# Patient Record
Sex: Female | Born: 1960 | Race: White | Hispanic: No | Smoking: Current some day smoker
Health system: Southern US, Community
[De-identification: ages and names within clinical notes are randomized; demographics above are authoritative.]

## PROBLEM LIST (undated history)

## (undated) DIAGNOSIS — C19 Malignant neoplasm of rectosigmoid junction: Secondary | ICD-10-CM

## (undated) DIAGNOSIS — K624 Stenosis of anus and rectum: Secondary | ICD-10-CM

## (undated) DIAGNOSIS — T8859XA Other complications of anesthesia, initial encounter: Secondary | ICD-10-CM

## (undated) DIAGNOSIS — T4145XA Adverse effect of unspecified anesthetic, initial encounter: Secondary | ICD-10-CM

## (undated) DIAGNOSIS — Z932 Ileostomy status: Secondary | ICD-10-CM

## (undated) DIAGNOSIS — Z973 Presence of spectacles and contact lenses: Secondary | ICD-10-CM

## (undated) DIAGNOSIS — Z87442 Personal history of urinary calculi: Secondary | ICD-10-CM

## (undated) DIAGNOSIS — M1990 Unspecified osteoarthritis, unspecified site: Secondary | ICD-10-CM

## (undated) HISTORY — PX: COLOSTOMY: SHX63

## (undated) HISTORY — DX: Unspecified osteoarthritis, unspecified site: M19.90

---

## 1982-08-19 HISTORY — PX: BUNIONECTOMY: SHX129

## 1992-08-19 HISTORY — PX: TUBAL LIGATION: SHX77

## 1999-06-11 ENCOUNTER — Other Ambulatory Visit: Admission: RE | Admit: 1999-06-11 | Discharge: 1999-06-11 | Payer: Self-pay | Admitting: Family Medicine

## 2006-07-21 ENCOUNTER — Other Ambulatory Visit: Admission: RE | Admit: 2006-07-21 | Discharge: 2006-07-21 | Payer: Self-pay | Admitting: Family Medicine

## 2009-05-30 ENCOUNTER — Other Ambulatory Visit: Admission: RE | Admit: 2009-05-30 | Discharge: 2009-05-30 | Payer: Self-pay | Admitting: Family Medicine

## 2012-06-09 LAB — HM MAMMOGRAPHY: HM Mammogram: NORMAL

## 2012-12-25 ENCOUNTER — Encounter: Payer: Self-pay | Admitting: *Deleted

## 2012-12-25 ENCOUNTER — Encounter: Payer: Self-pay | Admitting: Family Medicine

## 2012-12-25 ENCOUNTER — Other Ambulatory Visit (HOSPITAL_COMMUNITY)
Admission: RE | Admit: 2012-12-25 | Discharge: 2012-12-25 | Disposition: A | Discharge status: Home / Self Care | Payer: BC Managed Care – PPO | Source: Ambulatory Visit | Attending: Family Medicine | Admitting: Family Medicine

## 2012-12-25 ENCOUNTER — Ambulatory Visit (INDEPENDENT_AMBULATORY_CARE_PROVIDER_SITE_OTHER): Payer: BC Managed Care – PPO | Admitting: Family Medicine

## 2012-12-25 VITALS — BP 96/60 | HR 97 | Temp 98.0°F | Ht 64.75 in | Wt 113.2 lb

## 2012-12-25 DIAGNOSIS — Z01419 Encounter for gynecological examination (general) (routine) without abnormal findings: Secondary | ICD-10-CM | POA: Insufficient documentation

## 2012-12-25 DIAGNOSIS — Z124 Encounter for screening for malignant neoplasm of cervix: Secondary | ICD-10-CM | POA: Insufficient documentation

## 2012-12-25 DIAGNOSIS — Z1231 Encounter for screening mammogram for malignant neoplasm of breast: Secondary | ICD-10-CM

## 2012-12-25 DIAGNOSIS — R197 Diarrhea, unspecified: Secondary | ICD-10-CM | POA: Insufficient documentation

## 2012-12-25 DIAGNOSIS — Z136 Encounter for screening for cardiovascular disorders: Secondary | ICD-10-CM

## 2012-12-25 DIAGNOSIS — Z1211 Encounter for screening for malignant neoplasm of colon: Secondary | ICD-10-CM

## 2012-12-25 DIAGNOSIS — Z1331 Encounter for screening for depression: Secondary | ICD-10-CM

## 2012-12-25 LAB — CBC WITH DIFFERENTIAL/PLATELET
Basophils Relative: 0.6 % (ref 0.0–3.0)
Eosinophils Relative: 2.9 % (ref 0.0–5.0)
HCT: 45.8 % (ref 36.0–46.0)
Hemoglobin: 15.8 g/dL — ABNORMAL HIGH (ref 12.0–15.0)
Lymphs Abs: 2.5 10*3/uL (ref 0.7–4.0)
MCV: 94.4 fl (ref 78.0–100.0)
Monocytes Absolute: 0.6 10*3/uL (ref 0.1–1.0)
Neutrophils Relative %: 53.7 % (ref 43.0–77.0)

## 2012-12-25 LAB — BASIC METABOLIC PANEL
BUN: 10 mg/dL (ref 6–23)
GFR: 68.37 mL/min (ref >60.00)
Glucose, Bld: 76 mg/dL (ref 70–99)
Potassium: 3.9 mEq/L (ref 3.5–5.1)

## 2012-12-25 LAB — HEPATIC FUNCTION PANEL
AST: 13 U/L (ref 0–37)
Albumin: 3.8 g/dL (ref 3.5–5.2)

## 2012-12-25 LAB — LIPID PANEL
Cholesterol: 156 mg/dL (ref 0–200)
VLDL: 29.6 mg/dL (ref 0.0–40.0)

## 2012-12-25 NOTE — Patient Instructions (Addendum)
We will call you with your mammo and colonoscopy appts We'll notify you of your lab results and make any changes if needed Keep up the good work! Try and stop smoking Increase the amount of water and fiber in your diet- this should help the diarrhea Take Immodium as needed Call with any questions or concerns Welcome!  We're glad to have you!

## 2012-12-25 NOTE — Progress Notes (Signed)
  Subjective:    Patient ID: Angela Schmidt, female    DOB: 1960-08-24, 52 y.o.   MRN: [id number]  HPI New to establish.  Previous MD- none recently, 'i tend to just flip around'  Mammo- 2012 Colonoscopy- none Pap- 2012 [facility]  Diarrhea- 'nothing major'.  'on and off for last few months'.  Stools described as loose.  This week each BM has been loose.  Yesterday had 3-4 BMs and noted blood on toilet tissue and coating stool but not mixed in.  Pt has hx of hemorrhoids.   Review of Systems Patient reports no vision/ hearing changes, adenopathy,fever, weight change,  persistant/recurrent hoarseness , swallowing issues, chest pain, palpitations, edema, persistant/recurrent cough, hemoptysis, dyspnea (rest/exertional/paroxysmal nocturnal), gastrointestinal bleeding (melena, rectal bleeding), abdominal pain, significant heartburn, GU symptoms (dysuria, hematuria, incontinence), Gyn symptoms (abnormal  bleeding, pain),  syncope, focal weakness, memory loss, numbness & tingling, skin/hair/nail changes, abnormal bruising or bleeding, anxiety, or depression.     Objective:   Physical Exam  General Appearance:    Alert, cooperative, no distress, appears stated age  Head:    Normocephalic, without obvious abnormality, atraumatic  Eyes:    PERRL, conjunctiva/corneas clear, EOM's intact, fundi    benign, both eyes  Ears:    Normal TM's and external ear canals, both ears  Nose:   Nares normal, septum midline, mucosa normal, no drainage    or sinus tenderness  Throat:   Lips, mucosa, and tongue normal; teeth and gums normal  Neck:   Supple, symmetrical, trachea midline, no adenopathy;    Thyroid: no enlargement/tenderness/nodules  Back:     Symmetric, no curvature, ROM normal, no CVA tenderness  Lungs:     Clear to auscultation bilaterally, respirations unlabored  Chest Wall:    No tenderness or deformity   Heart:    Regular rate and rhythm, S1 and S2 normal, no murmur, rub   or gallop  Breast  Exam:    No tenderness, masses, or nipple abnormality  Abdomen:     Soft, non-tender, bowel sounds active all four quadrants,    no masses, no organomegaly  Genitalia:    External genitalia normal, cervix normal in appearance, no CMT, uterus in normal size and position, adnexa w/out mass or tenderness, mucosa pink and moist, no lesions or discharge present  Rectal:    Normal sphincter tone, small external hemorrhoid  Extremities:   Extremities normal, atraumatic, no cyanosis or edema  Pulses:   2+ and symmetric all extremities  Skin:   Skin color, texture, turgor normal, no rashes or lesions  Lymph nodes:   Cervical, supraclavicular, and axillary nodes normal  Neurologic:   CNII-XII intact, normal strength, sensation and reflexes    throughout          Assessment & Plan:

## 2012-12-27 NOTE — Assessment & Plan Note (Signed)
Pap collected.  Hx of abnormal pap w/ LEAP.  If abnormal, will need GYN referral.

## 2012-12-27 NOTE — Assessment & Plan Note (Signed)
New.  Blood likely due to external hemorrhoid.  immodium prn.  GI referral for colonoscopy.  Pt expressed understanding and is in agreement w/ plan.

## 2012-12-27 NOTE — Assessment & Plan Note (Signed)
Pt's PE WNL.  Due for mammo and colonoscopy.  Will refer.  Check labs.  Anticipatory guidance provided.

## 2012-12-28 ENCOUNTER — Encounter: Payer: Self-pay | Admitting: Internal Medicine

## 2012-12-30 ENCOUNTER — Encounter: Payer: Self-pay | Admitting: General Practice

## 2012-12-30 LAB — VITAMIN D 1,25 DIHYDROXY
Vitamin D2 1, 25 (OH)2: <8 pg/mL
Vitamin D3 1, 25 (OH)2: 33 pg/mL

## 2012-12-31 ENCOUNTER — Encounter: Payer: Self-pay | Admitting: *Deleted

## 2013-02-08 ENCOUNTER — Encounter: Payer: Self-pay | Admitting: Internal Medicine

## 2013-02-08 ENCOUNTER — Ambulatory Visit (AMBULATORY_SURGERY_CENTER): Payer: BC Managed Care – PPO | Admitting: *Deleted

## 2013-02-08 VITALS — Ht 63.0 in | Wt 112.0 lb

## 2013-02-08 DIAGNOSIS — Z1211 Encounter for screening for malignant neoplasm of colon: Secondary | ICD-10-CM

## 2013-02-08 MED ORDER — NA SULFATE-K SULFATE-MG SULF 17.5-3.13-1.6 GM/177ML PO SOLN: ORAL | Status: DC

## 2013-02-22 ENCOUNTER — Ambulatory Visit (AMBULATORY_SURGERY_CENTER): Payer: BC Managed Care – PPO | Admitting: Internal Medicine

## 2013-02-22 ENCOUNTER — Other Ambulatory Visit (INDEPENDENT_AMBULATORY_CARE_PROVIDER_SITE_OTHER): Payer: BC Managed Care – PPO

## 2013-02-22 ENCOUNTER — Encounter: Payer: Self-pay | Admitting: Internal Medicine

## 2013-02-22 ENCOUNTER — Other Ambulatory Visit: Payer: Self-pay

## 2013-02-22 VITALS — BP 116/72 | HR 58 | Temp 96.7°F | Resp 17 | Ht 63.0 in | Wt 112.0 lb

## 2013-02-22 DIAGNOSIS — Z1211 Encounter for screening for malignant neoplasm of colon: Secondary | ICD-10-CM

## 2013-02-22 DIAGNOSIS — K635 Polyp of colon: Secondary | ICD-10-CM

## 2013-02-22 DIAGNOSIS — R198 Other specified symptoms and signs involving the digestive system and abdomen: Secondary | ICD-10-CM

## 2013-02-22 DIAGNOSIS — D126 Benign neoplasm of colon, unspecified: Secondary | ICD-10-CM

## 2013-02-22 DIAGNOSIS — K6289 Other specified diseases of anus and rectum: Secondary | ICD-10-CM

## 2013-02-22 LAB — CBC WITH DIFFERENTIAL/PLATELET
Basophils Relative: 0.4 % (ref 0.0–3.0)
Eosinophils Relative: 1.3 % (ref 0.0–5.0)
Lymphocytes Relative: 23.6 % (ref 12.0–46.0)
Monocytes Relative: 4.6 % (ref 3.0–12.0)
Neutrophils Relative %: 70.1 % (ref 43.0–77.0)
Platelets: 298 10*3/uL (ref 150.0–400.0)
RBC: 4.89 Mil/uL (ref 3.87–5.11)
WBC: 9.8 10*3/uL (ref 4.5–10.5)

## 2013-02-22 LAB — COMPREHENSIVE METABOLIC PANEL
Alkaline Phosphatase: 81 U/L (ref 39–117)
CO2: 28 mEq/L (ref 19–32)
Chloride: 108 mEq/L (ref 96–112)
Glucose, Bld: 83 mg/dL (ref 70–99)
Potassium: 4.3 mEq/L (ref 3.5–5.1)
Sodium: 141 mEq/L (ref 135–145)

## 2013-02-22 LAB — CEA: CEA: 13.6 ng/mL — ABNORMAL HIGH (ref 0.0–5.0)

## 2013-02-22 MED ORDER — SODIUM CHLORIDE 0.9 % IV SOLN: 500.0000 mL | INTRAVENOUS | Status: DC

## 2013-02-22 NOTE — Progress Notes (Signed)
Patient did not experience any of the following events: a burn prior to discharge; a fall within the facility; wrong site/side/patient/procedure/implant event; or a hospital transfer or hospital admission upon discharge from the facility. (G8907) Patient did not have preoperative order for IV antibiotic SSI prophylaxis. (G8918)  

## 2013-02-22 NOTE — Op Note (Signed)
[facility] [address] [place], [address]   COLONOSCOPY PROCEDURE REPORT  PATIENT: Angela, Schmidt  MR#: [id number] BIRTHDATE: 1960-12-31 , 52  yrs. old GENDER: Female ENDOSCOPIST: Iva Boop, MD, [facility] REFERRED BM:[id number] Beverely Low, M.D. PROCEDURE DATE:  02/22/2013 PROCEDURE:   Colonoscopy with biopsy and snare polypectomy and Submucosal injection, any substance ASA CLASS:   Class I INDICATIONS:average risk screening. MEDICATIONS: propofol (Diprivan) 400mg  IV, These medications were titrated to patient response per physician's verbal order, and MAC sedation, administered by CRNA  DESCRIPTION OF PROCEDURE:   After the risks benefits and alternatives of the procedure were thoroughly explained, informed consent was obtained.  A digital rectal exam revealed a palpable anterior rectal mass, part mobile and part firm. The LB [id number]  endoscope was introduced through the anus and advanced to the cecum, which was identified by both the appendix and ileocecal valve. No adverse events experienced.   The quality of the prep was Suprep adequate  The instrument was then slowly withdrawn as the colon was fully examined.    COLON FINDINGS: A half circumferential ulcerated and polypoid shaped mass was found in the anterior rectum. It was soft and firm with a central ulcer.  Multiple biopsies were performed using cold forceps.  Injection (tattooing) was performed.   A diminutive sessile polyp was found in the descending colon.  A polypectomy was performed with a cold snare.  The resection was complete and the polyp tissue was not retrieved.   The colon mucosa was otherwise normal.  Retroflexed views revealed no abnormalities. The time to cecum=3 minutes 33 seconds.  Withdrawal time=17 minutes 49 seconds. The scope was withdrawn and the procedure completed. COMPLICATIONS: There were no complications.  ENDOSCOPIC IMPRESSION: 1.   Half-circumferential mass  were found in the anterior rectum; multiple biopsies were performed using cold forceps; Injection (tattooing) was performed distal and proximal 2.   Diminutive sessile polyp was found in the descending colon; polypectomy was performed with a cold snare 3.   The colon mucosa was otherwise normal  RECOMMENDATIONS: 1.  Await biopsy results 2.   Labs today (ordered) CBC, CMET, CEA and will arrange CT chest/abd/pelvis and surgery referral (Dr.  Maisie Fus or Gross) Depending upon CT results EUS would be needed.   eSigned:  Iva Boop, MD, [facility] 02/22/2013 10:32 AM cc: Neena Rhymes MD and The Patient

## 2013-02-22 NOTE — Progress Notes (Signed)
No allergies to eggs or soy per pt. 

## 2013-02-22 NOTE — Progress Notes (Signed)
Patient aware of the CT scan scheduled for 02/24/13 10:30.  She was provided instructions and contrast in the [facility].  She is aware I will be contacting her about surgical referral.   Surgical referral is scheduled for 03/15/13 9:00.  The patient is aware of the appt date and time

## 2013-02-22 NOTE — Patient Instructions (Addendum)
There was a mass in the rectum that will require surgery to remove. I think that it is a cancer. You will need a blood test today and my office will be arranging some CT scans also.  You also had a tiny polyp removed that I did not recover - it was benign.  You should hear more from me by Wednesday about the pathology and we will also be working on a surgery appointment. This is not good news but does not mean it is not treatable - we need to find out more about it.  I appreciate the opportunity to care for you. Iva Boop, MD, [facility]  Discharge instructions given with verbal understanding. Labs ordered today. Contrast given for CT SCAN . Given with discharge instructions today. resume previous medications. YOU HAD AN ENDOSCOPIC PROCEDURE TODAY AT THE [facility]: Refer to the procedure report that was given to you for any specific questions about what was found during the examination.  If the procedure report does not answer your questions, please call your gastroenterologist to clarify.  If you requested that your care partner not be given the details of your procedure findings, then the procedure report has been included in a sealed envelope for you to review at your convenience later.  YOU SHOULD EXPECT: Some feelings of bloating in the abdomen. Passage of more gas than usual.  Walking can help get rid of the air that was put into your GI tract during the procedure and reduce the bloating. If you had a lower endoscopy (such as a colonoscopy or flexible sigmoidoscopy) you may notice spotting of blood in your stool or on the toilet paper. If you underwent a bowel prep for your procedure, then you may not have a normal bowel movement for a few days.  DIET: Your first meal following the procedure should be a light meal and then it is ok to progress to your normal diet.  A half-sandwich or bowl of soup is an example of a good first meal.  Heavy or fried foods are harder to digest and  may make you feel nauseous or bloated.  Likewise meals heavy in dairy and vegetables can cause extra gas to form and this can also increase the bloating.  Drink plenty of fluids but you should avoid alcoholic beverages for 24 hours.  ACTIVITY: Your care partner should take you home directly after the procedure.  You should plan to take it easy, moving slowly for the rest of the day.  You can resume normal activity the day after the procedure however you should NOT DRIVE or use heavy machinery for 24 hours (because of the sedation medicines used during the test).    SYMPTOMS TO REPORT IMMEDIATELY: A gastroenterologist can be reached at any hour.  During normal business hours, 8:30 AM to 5:00 PM Monday through Friday, call [phone].  After hours and on weekends, please call the GI answering service at [phone] who will take a message and have the physician on call contact you.   Following lower endoscopy (colonoscopy or flexible sigmoidoscopy):  Excessive amounts of blood in the stool  Significant tenderness or worsening of abdominal pains  Swelling of the abdomen that is new, acute  Fever of 100F or higher  FOLLOW UP: If any biopsies were taken you will be contacted by phone or by letter within the next 1-3 weeks.  Call your gastroenterologist if you have not heard about the biopsies in 3 weeks.  Our  staff will call the home number listed on your records the next business day following your procedure to check on you and address any questions or concerns that you may have at that time regarding the information given to you following your procedure. This is a courtesy call and so if there is no answer at the home number and we have not heard from you through the emergency physician on call, we will assume that you have returned to your regular daily activities without incident.  SIGNATURES/CONFIDENTIALITY: You and/or your care partner have signed paperwork which will be entered into your  electronic medical record.  These signatures attest to the fact that that the information above on your After Visit Summary has been reviewed and is understood.  Full responsibility of the confidentiality of this discharge information lies with you and/or your care-partner.

## 2013-02-22 NOTE — Progress Notes (Signed)
Called to room to assist during endoscopic procedure.  Patient ID and intended procedure confirmed with present staff. Received instructions for my participation in the procedure from the performing physician.  

## 2013-02-23 ENCOUNTER — Telehealth: Payer: Self-pay | Admitting: *Deleted

## 2013-02-23 NOTE — Progress Notes (Signed)
Quick Note:  I called results of adenocarcinoma to her   [facility] - no letter but place a 1 year colonoscopy recall ______

## 2013-02-23 NOTE — Telephone Encounter (Signed)
Left message that we called for f/u 

## 2013-02-24 ENCOUNTER — Ambulatory Visit (INDEPENDENT_AMBULATORY_CARE_PROVIDER_SITE_OTHER)
Admission: RE | Admit: 2013-02-24 | Discharge: 2013-02-24 | Disposition: A | Discharge status: Home / Self Care | Payer: BC Managed Care – PPO | Source: Ambulatory Visit | Attending: Internal Medicine | Admitting: Internal Medicine

## 2013-02-24 DIAGNOSIS — R198 Other specified symptoms and signs involving the digestive system and abdomen: Secondary | ICD-10-CM

## 2013-02-24 DIAGNOSIS — K6289 Other specified diseases of anus and rectum: Secondary | ICD-10-CM

## 2013-02-24 MED ORDER — IOHEXOL 300 MG/ML  SOLN
100.0000 mL | Freq: Once | INTRAMUSCULAR | Status: AC | PRN
  Administered 2013-02-24: 100 mL via INTRAVENOUS

## 2013-02-24 NOTE — Progress Notes (Signed)
Quick Note:  Please let her know that CT does not show any distant mets but does show the tumor in rectum and some enlarged lymph nodes around it. We need her to do an EUS with Dr. Christella Hartigan (have spoken and cced this). Tell her to expect a scheduling call also or possibly coordinate with all. This is still treatable but we are gathering info to determine how and Dr. Maisie Fus will tell her more. ______

## 2013-02-25 ENCOUNTER — Encounter (HOSPITAL_COMMUNITY)
Admission: RE | Disposition: A | Discharge status: Home / Self Care | Payer: Self-pay | Source: Ambulatory Visit | Attending: Gastroenterology

## 2013-02-25 ENCOUNTER — Encounter (HOSPITAL_COMMUNITY): Payer: Self-pay | Admitting: Anesthesiology

## 2013-02-25 ENCOUNTER — Encounter (HOSPITAL_COMMUNITY): Payer: Self-pay | Admitting: *Deleted

## 2013-02-25 ENCOUNTER — Ambulatory Visit (HOSPITAL_COMMUNITY)
Admission: RE | Admit: 2013-02-25 | Discharge: 2013-02-25 | Disposition: A | Discharge status: Home / Self Care | Payer: BC Managed Care – PPO | Source: Ambulatory Visit | Attending: Gastroenterology | Admitting: Gastroenterology

## 2013-02-25 DIAGNOSIS — C20 Malignant neoplasm of rectum: Secondary | ICD-10-CM | POA: Insufficient documentation

## 2013-02-25 HISTORY — PX: EUS: SHX5427

## 2013-02-25 SURGERY — ULTRASOUND, LOWER GI TRACT, ENDOSCOPIC
Anesthesia: Moderate Sedation

## 2013-02-25 MED ORDER — MIDAZOLAM HCL 10 MG/2ML IJ SOLN
INTRAMUSCULAR | Status: DC | PRN
  Administered 2013-02-25 (×2): 2 mg via INTRAVENOUS
  Administered 2013-02-25: 3 mg via INTRAVENOUS

## 2013-02-25 MED ORDER — FENTANYL CITRATE 0.05 MG/ML IJ SOLN
INTRAMUSCULAR | Status: AC
  Filled 2013-02-25: qty 4

## 2013-02-25 MED ORDER — DIPHENHYDRAMINE HCL 50 MG/ML IJ SOLN
INTRAMUSCULAR | Status: AC
  Filled 2013-02-25: qty 1

## 2013-02-25 MED ORDER — MIDAZOLAM HCL 10 MG/2ML IJ SOLN
INTRAMUSCULAR | Status: AC
  Filled 2013-02-25: qty 4

## 2013-02-25 MED ORDER — FENTANYL CITRATE 0.05 MG/ML IJ SOLN
INTRAMUSCULAR | Status: DC | PRN
  Administered 2013-02-25 (×3): 25 ug via INTRAVENOUS

## 2013-02-25 MED ORDER — SODIUM CHLORIDE 0.9 % IV SOLN: INTRAVENOUS | Status: DC

## 2013-02-25 NOTE — H&P (Signed)
  HPI: This is a woman with newly diagnosed rectal adenocrcinoma,    Past Medical History  Diagnosis Date  . Arthritis   . Chicken pox   . Kidney stone     Past Surgical History  Procedure Laterality Date  . Tubal ligation    . Bunionectomy  1984-ish    Current Facility-Administered Medications  Medication Dose Route Frequency Provider Last Rate Last Dose  . 0.9 %  sodium chloride infusion   Intravenous Continuous Rachael Fee, MD        Allergies as of 02/24/2013  . (No Known Allergies)    Family History  Problem Relation Age of Onset  . Arthritis Mother   . Diabetes Mother   . Thyroid disease Mother   . Arthritis Father   . Diabetes Father   . Parkinson's disease Father   . Colon cancer Neg Hx     History   Social History  . Marital Status: Divorced    Spouse Name: N/A    Number of Children: N/A  . Years of Education: N/A   Occupational History  . Not on file.   Social History Main Topics  . Smoking status: Current Every Day Smoker -- 1.00 packs/day for 30 years    Types: Cigarettes  . Smokeless tobacco: Never Used  . Alcohol Use: No  . Drug Use: No  . Sexually Active: Not on file   Other Topics Concern  . Not on file   Social History Narrative  . No narrative on file      Physical Exam: BP 124/89  Pulse 72  Temp(Src) 97.8 F (36.6 C) (Oral)  Resp 16  SpO2 98%  LMP 11/15/2012 Constitutional: generally well-appearing Psychiatric: alert and oriented x3 Abdomen: soft, nontender, nondistended, no obvious ascites, no peritoneal signs, normal bowel sounds     Assessment and plan: 52 y.o. female with rectal adenocarcinoma  For staging EUS today

## 2013-02-25 NOTE — Op Note (Signed)
[facility] [address] [place], [address]   ENDOSCOPIC ULTRASOUND PROCEDURE REPORT  PATIENT: Angela Schmidt, Angela Schmidt  MR#: [id number] BIRTHDATE: 25-Aug-1960  GENDER: Female ENDOSCOPIST: Rachael Fee, MD REFERRED BY:  Iva Boop, M.D, [facility] PROCEDURE DATE:  02/25/2013 PROCEDURE:   Lower EUS ASA CLASS:      Class II INDICATIONS:   1.  rectal adenocarcinoma found on recent screening colonoscopy; CT scan showed no sign of distant spread. MEDICATIONS: Fentanyl 75 mcg IV and Versed 7 mg IV  DESCRIPTION OF PROCEDURE:   After the risks benefits and alternatives of the procedure were  explained, informed consent was obtained. The patient was then placed in the left, lateral, decubitus postion and IV sedation was administered. Throughout the procedure, the patients blood pressure, pulse and oxygen saturations were monitored continuously.  Under direct visualization, the Pentax Radial EUS [id number]  endoscope was introduced through the anus  and advanced to the sigmoid colon . Water was used as necessary to provide an acoustic interface.  Upon completion of the imaging, water was removed and the patient was sent to the recovery room in satisfactory condition.   Sigmoidoscopic findings: 1. Clearly malignant, non-circumferential mass along anterior wall of mid rectum. The mass was about 3cm across, distal edge 7cm from the anal verge.  Previous [place] Ink tatoo's evident.  EUS findings: 1. The mass above corresponded with a 3.6cm diameter, 1.4cm depth heterogeneous, hypoechoic mass that clearly passes into and through the muscularis propria layer of the rectal wall (uT3). 2. There were three small, hypoechoic, well circumscribed perirectal lymphnodes rangining in size from 3mm to 7mm. These were all suspcicious for malignant involvement (uN1).  Impression: uT3N1 non-circumferential, 3.6cm diameter, 1.4cm deep rectal adenocarcinoma along anterior wall of mid rectum  with distal edge 7cm from the anal verge.   _______________________________ eSignedRachael Fee, MD 02/25/2013 9:10 AM

## 2013-02-26 ENCOUNTER — Encounter (HOSPITAL_COMMUNITY): Payer: Self-pay | Admitting: Gastroenterology

## 2013-03-01 ENCOUNTER — Telehealth: Payer: Self-pay

## 2013-03-01 ENCOUNTER — Telehealth: Payer: Self-pay | Admitting: Internal Medicine

## 2013-03-01 DIAGNOSIS — C20 Malignant neoplasm of rectum: Secondary | ICD-10-CM

## 2013-03-01 NOTE — Telephone Encounter (Signed)
Left message for patient to call back  

## 2013-03-01 NOTE — Telephone Encounter (Signed)
Patient was given the appt information in the system for rad oncology.  She is advised that they should be in contact with her about the details for the appt.

## 2013-03-01 NOTE — Telephone Encounter (Signed)
Message copied by Donata Duff on Mon Mar 01, 2013  9:18 AM ------      Message from: Rachael Fee      Created: Fri Feb 26, 2013  2:50 PM       Kyrin Garn,            Can you set her up with medical oncology and radiation oncology referrals for newly diagnosed rectal adenocarcinoma.  Thanks            Dorene Grebe, [place]                  ----- Message -----         From: Romie Levee, MD         Sent: 02/26/2013   1:54 PM           To: Rachael Fee, MD, Iva Boop, MD            Jesusita Oka, thanks for getting that done.  Feel free to refer if you'd like to go ahead and get her set up.  Would definitely do that for a T3N1.  I can also do it next week, if that works better.            AT      ----- Message -----         From: Iva Boop, MD         Sent: 02/25/2013   9:39 AM           To: Rachael Fee, MD, Romie Levee, MD            Helmut Muster told me she was going to refer after she sees her- will forward to her to see how she wants to go            Has appt w/ Helmut Muster next week            I am off next week so you and alicia can present at board                  [facility]      ----- Message -----         From: Rachael Fee, MD         Sent: 02/25/2013   9:11 AM           To: Iva Boop, MD            Baldo Ash,       Just completed EUS.  She will likely benefit from neoadjuvant chemo/XRT.  Would refer to medical and radiation oncology. Also would put on list for next GI tumor board.  I'm happy to set all that up if you prefer.            DJ                        Sigmoidoscopic findings:      1. Clearly malignant, non-circumferential mass along anterior wall of mid rectum. The mass was about 3cm across, distal edge 7cm from the anal verge.  Previous [place] Ink tatoo's evident.            EUS findings:      1. The mass above corresponded with a 3.6cm diameter, 1.4cm depth heterogeneous, hypoechoic mass that clearly passes into and through the muscularis propria  layer of the rectal wall (uT3).      2.  There were three small, hypoechoic, well circumscribed perirectal lymphnodes rangining in size from 3mm to 7mm. These were all suspcicious for malignant involvement (uN1).            Impression:      uT3N1 non-circumferential, 3.6cm diameter, 1.4cm deep rectal adenocarcinoma along anterior wall of mid rectum with distal edge 7cm from the anal verge.                   ------

## 2013-03-01 NOTE — Telephone Encounter (Signed)
Pt has been referred.

## 2013-03-02 ENCOUNTER — Telehealth: Payer: Self-pay | Admitting: *Deleted

## 2013-03-02 ENCOUNTER — Ambulatory Visit (INDEPENDENT_AMBULATORY_CARE_PROVIDER_SITE_OTHER): Payer: BC Managed Care – PPO | Admitting: General Surgery

## 2013-03-02 ENCOUNTER — Encounter (INDEPENDENT_AMBULATORY_CARE_PROVIDER_SITE_OTHER): Payer: Self-pay | Admitting: General Surgery

## 2013-03-02 VITALS — BP 120/68 | HR 78 | Temp 98.0°F | Resp 15 | Ht 63.0 in | Wt 110.8 lb

## 2013-03-02 DIAGNOSIS — C20 Malignant neoplasm of rectum: Secondary | ICD-10-CM

## 2013-03-02 NOTE — Telephone Encounter (Signed)
Spoke with patient by phone.  Confirmed appointment with Dr. Mitzi Hansen for 03/10/13.  Appointment with Dr. Truett Perna is pending.  Will contact patient again once the appointment is given per Dr. Truett Perna.  Patient verbalized comprehension.

## 2013-03-02 NOTE — Patient Instructions (Addendum)
Colorectal Cancer  Colorectal cancer is the second most common cancer in the [place], striking 140,000 people annually and causing 60,000 deaths. That's a staggering figure when you consider the disease is potentially curable if diagnosed in the early stages. Who is at risk? Though colorectal cancer may occur at any age, more than 90% of the patients are over age 51, at which point the risk doubles every ten years. In addition to age, other high risk factors include a family history of colorectal cancer and polyps and a personal history of ulcerative colitis, colon polyps or cancer of other organs, especially of the breast or uterus. How does it start? It is generally agreed that nearly all colon and rectal cancer begins in benign polyps. These pre-malignant growths occur on the bowel wall and may eventually increase in size and become cancer. Removal of benign polyps is one aspect of preventive medicine that really works! What are the symptoms? The most common symptoms are rectal bleeding and changes in bowel habits, such as constipation or diarrhea. (These symptoms are also common in other diseases so it is important you receive a thorough examination should you experience them.) Abdominal pain and weight loss are usually late symptoms indicating possible extensive disease. Unfortunately, many polyps and early cancers fail to produce symptoms. Therefore, it is important that your routine physical includes colorectal cancer detection procedures once you reach age 50.  There are several methods for detection of colorectal cancer. These include digital rectal examination, a chemical test of the stool for blood, flexible sigmoidoscopy and colonoscopy (lighted tubular instruments used to inspect the lower bowel) and barium enema. Be sure to discuss these options with your surgeon to determine which procedure is best for you. Individuals who have a first-degree relative (parent or sibling) with colon  cancer or polyps should start their colon cancer screening at the age of 51. How is colorectal cancer treated? Colorectal cancer requires surgery in nearly all cases for complete cure. Radiation and chemotherapy are sometimes used in addition to surgery. Between 80-90% are restored to normal health if the cancer is detected and treated in the earliest stages. The cure rate drops to 50% or less when diagnosed in the later stages. Thanks to modern technology, less than 5% of all colorectal cancer patients require a colostomy, the surgical construction of an artificial excretory opening from the colon. Can colon cancer be prevented? Colon cancer is very preventable. The most important step towards preventing colon cancer is getting a screening test. Any abnormal screening test should be followed by a colonoscopy. Some individuals prefer to start with colonoscopy as a screening test. Colonoscopy provides a detailed examination of the bowel. Polyps can be identified and can often be removed during colonoscopy. Though not definitely proven, there is some evidence that diet may play a significant role in preventing colorectal cancer. As far as we know, a high fiber, low fat diet is the only dietary measure that might help prevent colorectal cancer. Finally, pay attention to changes in your bowel habits. Any new changes such as persistent constipation, diarrhea, or blood in the stool should be discussed with your physician.   Can hemorrhoids lead to colon cancer? No, but hemorrhoids may produce symptoms similar to colon polyps or cancer. Should you experience these symptoms, you should have them examined and evaluated by a physician, preferably by a colon and rectal surgeon. What is a colon and rectal surgeon? Colon and rectal surgeons are experts in the surgical and non-surgical treatment   of diseases of the colon, rectum and anus. They have completed advanced surgical training in the treatment of these  diseases as well as full general surgical training. Board-certified colon and rectal surgeons complete residencies in general surgery and colon and rectal surgery, and pass intensive examinations conducted by the American Board of Surgery and the American Board of Colon and Rectal Surgery. They are well-versed in the treatment of both benign and malignant diseases of the colon, rectum and anus and are able to perform routine screening examinations and surgically treat conditions if indicated to do so.  2012 American Society of Colon & Rectal Surgeons  

## 2013-03-02 NOTE — Progress Notes (Signed)
Chief Complaint  Patient presents with  . New Evaluation    eval rectal mass    HISTORY: Angela Schmidt is a 52 y.o. female who presents to the office with a rectal cancer.  This was found on colnoscopy.  Biopsy showed adenocarcinoma.  Workup thus far has included CEA, CT chest, abd and pelvis, and rectal [place].  She has had some vague changes in stool and occasional rectal bleeding.  She denies abd pain, nausea or weight loss.  Past Medical History  Diagnosis Date  . Arthritis   . Chicken pox   . Kidney stone   . Cancer        Past Surgical History  Procedure Laterality Date  . Tubal ligation    . Bunionectomy  1984-ish  . Eus N/A 02/25/2013    Procedure: LOWER ENDOSCOPIC ULTRASOUND (EUS);  Surgeon: Rachael Fee, MD;  Location: [facility];  Service: Endoscopy;  Laterality: N/A;  radial      Current Outpatient Prescriptions  Medication Sig Dispense Refill  . traMADol (ULTRAM) 50 MG tablet Take 50 mg by mouth every 6 (six) hours as needed for pain. Takes 1/2 -1 tab daily.       No current facility-administered medications for this visit.      No Known Allergies    Family History  Problem Relation Age of Onset  . Arthritis Mother   . Diabetes Mother   . Thyroid disease Mother   . Arthritis Father   . Diabetes Father   . Parkinson's disease Father   . Colon cancer Neg Hx       History   Social History  . Marital Status: Divorced    Spouse Name: N/A    Number of Children: N/A  . Years of Education: N/A   Social History Main Topics  . Smoking status: Current Every Day Smoker -- 1.00 packs/day for 30 years    Types: Cigarettes  . Smokeless tobacco: Never Used  . Alcohol Use: No  . Drug Use: No  . Sexually Active: None   Other Topics Concern  . None   Social History Narrative  . None       REVIEW OF SYSTEMS - PERTINENT POSITIVES ONLY: Review of Systems - General ROS: negative for - chills, fever or weight loss Hematological and Lymphatic ROS: negative  for - bleeding problems, blood clots or bruising Respiratory ROS: no cough, shortness of breath, or wheezing Cardiovascular ROS: no chest pain or dyspnea on exertion Gastrointestinal ROS: positive for - blood in stools negative for - abdominal pain or change in bowel habits Genito-Urinary ROS: no dysuria, trouble voiding, or hematuria  EXAM: Filed Vitals:   03/02/13 0933  BP: 120/68  Pulse: 78  Temp: 98 F (36.7 C)  Resp: 15   Gen:  No acute distress.  Well nourished and well groomed.   Neurological: Alert and oriented to person, place, and time. Coordination normal.  Head: Normocephalic and atraumatic.  Eyes: Conjunctivae are normal. Pupils are equal, round, and reactive to light. No scleral icterus.  Neck: Normal range of motion. Neck supple. No tracheal deviation or thyromegaly present.  No cervical lymphadenopathy. Cardiovascular: Normal rate, regular rhythm, normal heart sounds and intact distal pulses.  Exam reveals no gallop and no friction rub.  No murmur heard. Respiratory: Effort normal.  No respiratory distress. No chest wall tenderness. Breath sounds normal.  No wheezes, rales or rhonchi.  GI: Soft. Bowel sounds are normal. The abdomen is soft and nontender.  There is no rebound and no guarding.  Musculoskeletal: Normal range of motion. Extremities are nontender.  Skin: Skin is warm and dry. No rash noted. No diaphoresis. No erythema. No pallor. No clubbing, cyanosis, or edema.   Psychiatric: Normal mood and affect. Behavior is normal. Judgment and thought content normal.   Anorectal Exam: mass palpated at tip of finger ~6-8 cm from anal verge.     LABORATORY RESULTS: Available labs are reviewed  CEA: 13.6  RADIOLOGY RESULTS:   Images and reports are reviewed. CT CHEST IMPRESSION:  1. No acute process or evidence of metastatic disease in the chest.  2. Moderate to severe centrilobular emphysema.  3. Subtle nodularity within the trachea and bronchi. Possibly related  to secretions. Recommend attention on follow-up to exclude tracheal polyps.  CT ABD/PELVIS IMPRESSION:  1. Anorectal wall thickening which likely represents the primary. Adjacent adenopathy in the mesorectum, highly suspicious for localized nodal disease. Cannot exclude direct involvement of the circumferential resection margin. This would be better evaluated with MRI.  2. No evidence of distant metastasis within the abdomen or pelvis.  3. Probable complex left ovarian dominant follicle/cyst.   4. Mild left renal atrophy.   ASSESSMENT AND PLAN:  Angela Schmidt is a 52 y.o. F with recently diagnosed rectal cancer.  This was a T3 N1 on rectal [place].  She is scheduled to see radiation and oncology for neoadjuvant treatment.  I think she would be a good surgical candidate and will require an LAR and diverting loop ileostomy after her chemoRT treatment.  There is no reason that we couldn't do this laparoscopically.  We also discussed smoking cessation as part of her treatment plan.  I will see her back once her radiation therapy is complete to discuss the surgery in more detail.   Vanita Panda, MD Colon and Rectal Surgery / General Surgery [facility]      Visit Diagnoses: 1. Rectal cancer     Primary Care Physician: Neena Rhymes, MD

## 2013-03-03 ENCOUNTER — Telehealth: Payer: Self-pay | Admitting: *Deleted

## 2013-03-03 NOTE — Telephone Encounter (Signed)
Spoke with patient by phone and confirmed both appointments with Dr. Truett Perna and Dr. Mitzi Hansen for 03/10/13.  Contact names and numbers were provided,

## 2013-03-08 NOTE — Progress Notes (Signed)
GI Location of Tumor / Histology: Rectal Cancer  Patient presented with routine colonscopy  Biopsies of rectum (if applicable) revealed: adenocarcinoma  Past/Anticipated interventions by surgeon, if any: LAR and diverting loop ileostomy after chemo/RT treatment  Past/Anticipated interventions by medical oncology, if any: appt with Dr. Alcide Evener this afternoon.  Weight changes, if any: no  Bowel/Bladder complaints, if any: frequency of bowel movements in the mornings.  She has noticed rectal bleeding.   Nausea / Vomiting, if any: no  Pain issues, if any:  no  SAFETY ISSUES:  Prior radiation? no  Pacemaker/ICD? no  Possible current pregnancy? no  Is the patient on methotrexate? no  Current Complaints / other details:  Angela Schmidt here with her daughter for consult for rectal cancer.  She denies pain.  She has noticed rectal bleeding and frequency of bowel movements in the mornings.

## 2013-03-10 ENCOUNTER — Encounter: Payer: Self-pay | Admitting: Radiation Oncology

## 2013-03-10 ENCOUNTER — Ambulatory Visit
Admission: RE | Admit: 2013-03-10 | Discharge: 2013-03-10 | Disposition: A | Discharge status: Home / Self Care | Payer: BC Managed Care – PPO | Source: Ambulatory Visit | Attending: Radiation Oncology | Admitting: Radiation Oncology

## 2013-03-10 ENCOUNTER — Telehealth: Payer: Self-pay | Admitting: Oncology

## 2013-03-10 ENCOUNTER — Ambulatory Visit (HOSPITAL_BASED_OUTPATIENT_CLINIC_OR_DEPARTMENT_OTHER): Payer: BC Managed Care – PPO | Admitting: Oncology

## 2013-03-10 ENCOUNTER — Encounter: Payer: Self-pay | Admitting: Oncology

## 2013-03-10 ENCOUNTER — Ambulatory Visit: Payer: BC Managed Care – PPO

## 2013-03-10 VITALS — BP 109/67 | HR 92 | Temp 97.3°F | Resp 18 | Ht 63.0 in | Wt 109.9 lb

## 2013-03-10 VITALS — BP 109/69 | HR 84 | Temp 97.9°F | Ht 63.0 in | Wt 110.5 lb

## 2013-03-10 DIAGNOSIS — C20 Malignant neoplasm of rectum: Secondary | ICD-10-CM

## 2013-03-10 NOTE — Telephone Encounter (Signed)
Gave pt appt for lab and MD for 04/07/13 and chemo class

## 2013-03-10 NOTE — Progress Notes (Signed)
Checked in new pt with no financial concerns. °

## 2013-03-10 NOTE — Progress Notes (Signed)
[facility] New Patient Consult   Referring MD: Shaquan Puerta 52 y.o.  25-Sep-1960    Reason for Referral: Rectal cancer     HPI: She was referred to Dr. Leone Payor for a screening colonoscopy of 02/22/2013. A mass was found in the anterior rectum. The mass was biopsied and tattooed. A polyp was found in the descending colon. The polyp was resected and not retrieved. The colon mucosa was otherwise normal.  The pathology [phone]) a revealed adenocarcinoma.  CTs of the chest, abdomen, and pelvis on 02/24/2013 revealed emphysema. No suspicious pulmonary nodule or mass. Subtle nodularity in the trachea and bronchi were felt to potentially represent secretions. The liver appeared normal. Moderate anorectal wall thickening with adenopathy in the adjacent mesorectum. No evidence of omental or peritoneal disease.  She was referred to Dr. Christella Hartigan and underwent an endoscopic ultrasound on 02/25/2013. A mass was noted along the anterior wall of the mid rectum. The distal edge of the mass was at 7 cm from the anal verge. The mass clearly passed into and through the muscular propria (UT 3). 3 small hypoechoic perirectal lymph nodes were all suspicious for malignant involvement (uN1) .  Ms. Anise Salvo saw Dr. Maisie Fus and has been referred for neoadjuvant therapy.    Past Medical History  Diagnosis Date  . Arthritis-"rheumatoid "   . Chicken pox   . Kidney stone   .  rectal cancer, clinical stage III (uT3,uN1)  July 2014     .   G4 P3, tubal pregnancy, irregular menses  Past Surgical History  Procedure Laterality Date  . Tubal ligation    . Bunionectomy  1984-ish  . Eus N/A 02/25/2013    Procedure: LOWER ENDOSCOPIC ULTRASOUND (EUS);  Surgeon: Rachael Fee, MD;  Location: [facility];  Service: Endoscopy;  Laterality: N/A;  radial    Family History  Problem Relation Age of Onset  . Arthritis Mother   . Diabetes Mother   . Thyroid disease Mother   .  Arthritis Father   . Diabetes Father   . Parkinson's disease Father   . Colon cancer Neg Hx    7 siblings-no cancer, paternal aunts/uncles were smokers and had head and neck/lung cancer  Current outpatient prescriptions:traMADol (ULTRAM) 50 MG tablet, Take 50 mg by mouth every 6 (six) hours as needed for pain. Takes 1/2 -1 tab daily., Disp: , Rfl:   Allergies: No Known Allergies  Social History:   She lives with her daughters in [place]. She works for a [employer]. She smokes one pack of cigarettes per day. No alcohol use. No transfusion history . No risk factor for HIV or hepatitis.   ROS:   Positives include: Night sweats-intermittent, she relates these to being premenopausal, rectal urgency/bleeding since undergoing a colonoscopy, chronic low back pain  A complete ROS was otherwise negative.  Physical Exam:  Blood pressure 109/67, pulse 92, temperature 97.3 F (36.3 C), temperature source Oral, resp. rate 18, height 5\' 3"  (1.6 m), weight 109 lb 14.4 oz (49.85 kg), last menstrual period 11/15/2012.  HEENT: Upper and lower denture plate. Oropharynx without visible mass, neck without mass Lungs: Distant breath sounds, no respiratory distress Cardiac: Regular rate and rhythm Abdomen: No hepatomegaly, nontender, no mass  Vascular: No leg Lymph nodes: No cervical, supra-clavicular, axillary, or inguinal nodes Neurologic: Alert and oriented, the motor exam appears intact in the upper and lower extremities Skin: No rash   LAB:  CBC  Lab Results  Component  Value Date   WBC 9.8 02/22/2013   HGB 15.7* 02/22/2013   HCT 47.1* 02/22/2013   MCV 96.3 02/22/2013   PLT 298.0 02/22/2013     CMP      Component Value Date/Time   NA 141 02/22/2013 1133   K 4.3 02/22/2013 1133   CL 108 02/22/2013 1133   CO2 28 02/22/2013 1133   GLUCOSE 83 02/22/2013 1133   BUN 7 02/22/2013 1133   CREATININE 0.8 02/22/2013 1133   CALCIUM 9.1 02/22/2013 1133   PROT 7.1 02/22/2013 1133   ALBUMIN 3.8 02/22/2013 1133    AST 20 02/22/2013 1133   ALT 19 02/22/2013 1133   ALKPHOS 81 02/22/2013 1133   BILITOT 1.0 02/22/2013 1133   CEA on 02/22/2013-13.6  Radiology: As per history of present illness    Assessment/Plan:   1. Rectal cancer, cycle stage III-uT3,uN1, measured at 7 cm from the anal verge, no evidence for metastatic disease on CTs of the chest, abdomen, and pelvis 02/24/2013. Elevated CEA  2. "Rheumatoid "arthritis  3. Ongoing tobacco use-I counseled her to discontinue smoking  4. Trachea/bronchial nodularity noted on the chest CT 02/24/2013   Disposition:   Ms. Calabria has been diagnosed with locally advanced rectal cancer. I discussed the diagnosis and treatment options with Ms. Deiss and her family. I recommend neoadjuvant capecitabine and radiation. She saw Dr. Mitzi Hansen earlier today and is being scheduled for radiation simulation with a plan to begin radiation on 03/22/2013.  I reviewed the potential toxicities associated with capecitabine including the chance for mucositis, diarrhea, and hematologic toxicity. We discussed the hyperpigmentation, rash, and hand/foot syndrome associated with capecitabine. She will attend a chemotherapy teaching class.   We will refer her back to Dr. Maisie Fus at the completion of neoadjuvant therapy. We will followup on the pathology from the primary tumor resection and recommend additional chemotherapy is indicated.  Ms. Odonohue will return for an office visit approximately 2 weeks after beginning neoadjuvant therapy.  Ajia Chadderdon 03/10/2013, 3:43 PM

## 2013-03-10 NOTE — Progress Notes (Signed)
Please see the Nurse Progress Note in the MD Initial Consult Encounter for this patient. 

## 2013-03-11 ENCOUNTER — Other Ambulatory Visit: Payer: Self-pay | Admitting: *Deleted

## 2013-03-11 MED ORDER — CAPECITABINE 500 MG PO TABS: ORAL_TABLET | ORAL | Status: DC

## 2013-03-11 NOTE — Progress Notes (Signed)
Radiation Oncology         [phone] ________________________________  Name: Angela Schmidt MRN: [id number]  Date: 03/10/2013  DOB: Aug 28, 1960  WU:[id number] Beverely Low, MD  Rachael Fee, MD   G. Rolm Baptise, MD  REFERRING PHYSICIAN: Rachael Fee, MD   DIAGNOSIS: The encounter diagnosis was Rectal cancer.   HISTORY OF PRESENT ILLNESS::Angela Schmidt is a 52 y.o. female who is seen for an initial consultation visit. The patient was found to have a suspicious rectal mass on screening colonoscopy. The patient indicates that this was her first colonoscopy. A half circumferential mass was found within the anterior rectum and a biopsy was obtained. A sessile polyp was also noted within the descending colon which was removed. The patient's biopsy returned positive for adenocarcinoma.  Further workup included an endoscopic ultrasound which showed a malignant appearing non-circumferential mass along the anterior wall of the mid rectum. The distal and of the tumor was 7 cm from the anal verge. The diameter of the mass was 3.6 cm. Endoscopically this corresponded to a uT3N1 tumor.  The patient has proceeded to undergo a CT scan of the chest abdomen and pelvis. No acute process or evidence of metastatic disease was seen within the chest. Anorectal wall thickening was seen corresponding to the likely primary tumor with adjacent adenopathy in the knees her rectum which was highly suspicious for localized nodal disease. No evidence of distant metastasis within the abdomen or pelvis. The patient's CEA level is 13.9.  The patient has been seen by Dr. Maisie Fus in surgery who feels that the patient is a an appropriate candidate for neoadjuvant chemoradiotherapy followed by surgical resection.    PREVIOUS RADIATION THERAPY: No   PAST MEDICAL HISTORY:  has a past medical history of Arthritis; Chicken pox; Kidney stone; and Cancer.     PAST SURGICAL HISTORY: Past Surgical History  Procedure  Laterality Date  . Tubal ligation    . Bunionectomy  1984-ish  . Eus N/A 02/25/2013    Procedure: LOWER ENDOSCOPIC ULTRASOUND (EUS);  Surgeon: Rachael Fee, MD;  Location: [facility];  Service: Endoscopy;  Laterality: N/A;  radial     FAMILY HISTORY: family history includes Arthritis in her father and mother; Diabetes in her father and mother; Parkinson's disease in her father; and Thyroid disease in her mother.  There is no history of Colon cancer.   SOCIAL HISTORY:  reports that she has been smoking Cigarettes.  She has a 30 pack-year smoking history. She has never used smokeless tobacco. She reports that she does not drink alcohol or use illicit drugs.   ALLERGIES: Review of patient's allergies indicates no known allergies.   MEDICATIONS:  Current Outpatient Prescriptions  Medication Sig Dispense Refill  . traMADol (ULTRAM) 50 MG tablet Take 50 mg by mouth every 6 (six) hours as needed for pain. Takes 1/2 -1 tab daily.       No current facility-administered medications for this encounter.     REVIEW OF SYSTEMS:  A 15 point review of systems is documented in the electronic medical record. This was obtained by the nursing staff. However, I reviewed this with the patient to discuss relevant findings and make appropriate changes.  Pertinent items are noted in HPI.    PHYSICAL EXAM:  height is 5\' 3"  (1.6 m) and weight is 110 lb 8 oz (50.122 kg). Her temperature is 97.9 F (36.6 C). Her blood pressure is 109/69 and her pulse is 84.   General: Well-developed, in no acute  distress HEENT: Normocephalic, atraumatic; oral cavity clear Neck: Supple without any lymphadenopathy Cardiovascular: Regular rate and rhythm Respiratory: Clear to auscultation bilaterally GI: Soft, nontender, normal bowel sounds Extremities: No edema present Neuro: No focal deficits Rectal:  Tumor palpated at the tip of the finger corresponding to endoscopic findings of approximately 7 cm from the anal  verge    LABORATORY DATA:  Lab Results  Component Value Date   WBC 9.8 02/22/2013   HGB 15.7* 02/22/2013   HCT 47.1* 02/22/2013   MCV 96.3 02/22/2013   PLT 298.0 02/22/2013   Lab Results  Component Value Date   NA 141 02/22/2013   K 4.3 02/22/2013   CL 108 02/22/2013   CO2 28 02/22/2013   Lab Results  Component Value Date   ALT 19 02/22/2013   AST 20 02/22/2013   ALKPHOS 81 02/22/2013   BILITOT 1.0 02/22/2013      RADIOGRAPHY: Ct Chest W Contrast  02/24/2013   *RADIOLOGY REPORT*  Clinical Data:  Rectal mass found on routine colonoscopy.  History of rectal bleeding.  CT CHEST, ABDOMEN AND PELVIS WITH CONTRAST  Technique: Contiguous axial images of the chest abdomen and pelvis were obtained after IV contrast administration.  Contrast: 100  ml Omnipaque-300  Comparison: None  CT CHEST  Findings: Lung windows demonstrate irregularity within the left mainstem bronchus dependently which measures 4 mm on image 21/series 3.  Subtle nondependent irregularity within the proximal bronchus intermedius image 24/series 3. Possible left sided trachea irregularity on image 16/series 3.  Moderate to severe centrilobular emphysema with biapical scarring.  No suspicious pulmonary nodule or mass.  Soft tissue windows demonstrate no supraclavicular adenopathy. Normal heart size without pericardial or pleural effusion.  No central pulmonary embolism, on this non-dedicated study.  No mediastinal or hilar adenopathy.  IMPRESSION:  1.  No acute process or evidence of metastatic disease in the chest. 2.  Moderate to severe centrilobular emphysema. 3.  Subtle nodularity within the trachea and bronchi.  Possibly related to secretions.  Recommend attention on follow-up to exclude tracheal polyps.  CT ABDOMEN AND PELVIS  Findings:  Normal liver, spleen.  Gastric underdistention.  Normal pancreas, gallbladder, biliary tract, adrenal glands.  Mild left renal atrophy.  Normal right kidney.  Aortic atherosclerosis.  Small retroperitoneal nodes,  without adenopathy.  Moderate anorectal wall thickening, including on image 106 - 109/series 2.  This likely represents the site of primary malignancy.  Adenopathy in the adjacent mesorectum.  A node at the 10 o'clock position measures 9 mm on image 107/series 2.  A small but suspicious based on location node at the 3 o'clock position measures 5 mm on image 101 series 2.  Cannot exclude direct involvement of the circumferential resection margin on image 106/series 2 at the 5 o'clock position.  No more cephalad adenopathy in the sigmoid mesocolon .  No obstruction.  Scattered diverticula throughout the remainder of the colon. Normal terminal ileum and appendix.  Normal small bowel without abdominal ascites.    No evidence of omental or peritoneal disease.  No pelvic sidewall adenopathy.  Normal urinary bladder.  Probable Nabothian cysts.  Arcuate type uterus.  Dominant follicle/cyst at 2.9 cm within the left ovary.  Mild complexity within, as evidenced by slightly increased density. No significant free fluid.  No acute osseous abnormality.  Degenerative disc disease at the lumbosacral junction.  IMPRESSION:  1.  Anorectal wall thickening which likely represents the primary. Adjacent adenopathy in the mesorectum, highly suspicious for localized nodal disease.  Cannot exclude direct involvement of the circumferential resection margin.  This would be better evaluated with MRI. 2.  No evidence of distant metastasis within the abdomen or pelvis. 3.  Probable complex left ovarian dominant follicle/cyst. 4.  Mild left renal atrophy.   Original Report Authenticated By: Jeronimo Greaves, M.D.   Ct Abdomen Pelvis W Contrast  02/24/2013   *RADIOLOGY REPORT*  Clinical Data:  Rectal mass found on routine colonoscopy.  History of rectal bleeding.  CT CHEST, ABDOMEN AND PELVIS WITH CONTRAST  Technique: Contiguous axial images of the chest abdomen and pelvis were obtained after IV contrast administration.  Contrast: 100  ml  Omnipaque-300  Comparison: None  CT CHEST  Findings: Lung windows demonstrate irregularity within the left mainstem bronchus dependently which measures 4 mm on image 21/series 3.  Subtle nondependent irregularity within the proximal bronchus intermedius image 24/series 3. Possible left sided trachea irregularity on image 16/series 3.  Moderate to severe centrilobular emphysema with biapical scarring.  No suspicious pulmonary nodule or mass.  Soft tissue windows demonstrate no supraclavicular adenopathy. Normal heart size without pericardial or pleural effusion.  No central pulmonary embolism, on this non-dedicated study.  No mediastinal or hilar adenopathy.  IMPRESSION:  1.  No acute process or evidence of metastatic disease in the chest. 2.  Moderate to severe centrilobular emphysema. 3.  Subtle nodularity within the trachea and bronchi.  Possibly related to secretions.  Recommend attention on follow-up to exclude tracheal polyps.  CT ABDOMEN AND PELVIS  Findings:  Normal liver, spleen.  Gastric underdistention.  Normal pancreas, gallbladder, biliary tract, adrenal glands.  Mild left renal atrophy.  Normal right kidney.  Aortic atherosclerosis.  Small retroperitoneal nodes, without adenopathy.  Moderate anorectal wall thickening, including on image 106 - 109/series 2.  This likely represents the site of primary malignancy.  Adenopathy in the adjacent mesorectum.  A node at the 10 o'clock position measures 9 mm on image 107/series 2.  A small but suspicious based on location node at the 3 o'clock position measures 5 mm on image 101 series 2.  Cannot exclude direct involvement of the circumferential resection margin on image 106/series 2 at the 5 o'clock position.  No more cephalad adenopathy in the sigmoid mesocolon .  No obstruction.  Scattered diverticula throughout the remainder of the colon. Normal terminal ileum and appendix.  Normal small bowel without abdominal ascites.    No evidence of omental or peritoneal  disease.  No pelvic sidewall adenopathy.  Normal urinary bladder.  Probable Nabothian cysts.  Arcuate type uterus.  Dominant follicle/cyst at 2.9 cm within the left ovary.  Mild complexity within, as evidenced by slightly increased density. No significant free fluid.  No acute osseous abnormality.  Degenerative disc disease at the lumbosacral junction.  IMPRESSION:  1.  Anorectal wall thickening which likely represents the primary. Adjacent adenopathy in the mesorectum, highly suspicious for localized nodal disease.  Cannot exclude direct involvement of the circumferential resection margin.  This would be better evaluated with MRI. 2.  No evidence of distant metastasis within the abdomen or pelvis. 3.  Probable complex left ovarian dominant follicle/cyst. 4.  Mild left renal atrophy.   Original Report Authenticated By: Jeronimo Greaves, M.D.       IMPRESSION: The patient is presenting clinically with a new diagnosis of T3N1M0 adenocarcinoma of the mid rectum. The distance from the anal verge to the distal aspect of the tumor is 7 cm. The patient is an appropriate candidate to proceed with neoadjuvant  chemoradiotherapy followed by surgical resection.  I discussed therefore with the patient a potential 5-1/2 week course of chemoradiotherapy. The patient is being seen by medical oncology later today. I discussed the rationale of this treatment preoperatively. We discussed the benefit of radiation in terms of local/regional control. We also discussed the potential side effects and risks of treatment. All of her questions were answered.  The patient wishes to proceed with this overall treatment plan.   PLAN: The patient will be scheduled for a simulation as soon as possible. I anticipate beginning her course of radiation on 03/22/2013.   I spent 60 minutes minutes face to face with the patient and more than 50% of that time was spent in counseling and/or coordination of care.     ________________________________   Radene Gunning, MD, PhD

## 2013-03-15 ENCOUNTER — Ambulatory Visit (INDEPENDENT_AMBULATORY_CARE_PROVIDER_SITE_OTHER): Payer: BC Managed Care – PPO | Admitting: General Surgery

## 2013-03-15 ENCOUNTER — Ambulatory Visit
Admission: RE | Admit: 2013-03-15 | Discharge: 2013-03-15 | Disposition: A | Discharge status: Home / Self Care | Payer: BC Managed Care – PPO | Source: Ambulatory Visit | Attending: Radiation Oncology | Admitting: Radiation Oncology

## 2013-03-15 DIAGNOSIS — C20 Malignant neoplasm of rectum: Secondary | ICD-10-CM | POA: Insufficient documentation

## 2013-03-15 DIAGNOSIS — R197 Diarrhea, unspecified: Secondary | ICD-10-CM | POA: Insufficient documentation

## 2013-03-15 DIAGNOSIS — L989 Disorder of the skin and subcutaneous tissue, unspecified: Secondary | ICD-10-CM | POA: Insufficient documentation

## 2013-03-15 DIAGNOSIS — Z79899 Other long term (current) drug therapy: Secondary | ICD-10-CM | POA: Insufficient documentation

## 2013-03-15 DIAGNOSIS — Z510 Encounter for antineoplastic radiation therapy: Secondary | ICD-10-CM | POA: Insufficient documentation

## 2013-03-16 ENCOUNTER — Encounter: Payer: Self-pay | Admitting: *Deleted

## 2013-03-16 ENCOUNTER — Other Ambulatory Visit: Payer: BC Managed Care – PPO

## 2013-03-16 NOTE — Addendum Note (Signed)
Encounter addended by: Delynn Flavin, RN on: 03/16/2013 11:10 AM<BR>     Documentation filed: Charges VN

## 2013-03-16 NOTE — Progress Notes (Signed)
  Radiation Oncology         [phone]) [phone] ________________________________  Name: Angela Schmidt MRN: [id number]  Date: 03/15/2013  DOB: 07-08-1961  SIMULATION AND TREATMENT PLANNING NOTE  The patient presented for simulation for the patient's upcoming course of preoperative radiation for the diagnosis of rectal cancer. The patient was placed in a supine position. A customized alpha cradle was constructed toaid in patient immobilization. This complex treatment device will be used on a daily basis during the treatment. In this fashion a CT scan was obtained through the pelvic region and the isocenter was placed near midline within the pelvis.  The patient will initially be planned to receive a course of radiation to a dose of 45 gray. This will be accomplished in 25 fractions at 1.8 gray per fraction. This initial treatment will correspond to a 3-D conformal technique. The gross tumor volume has been contoured in addition to the rectum, bladder and femoral heads. DVH's of each of these structures have been requested and these will be carefully reviewed as part of the 3-D conformal treatment planning process. To accomplish this initial treatment, 4 customized blocks have been designed for this purpose. Each of these 4 complex treatment devices will be used on a daily basis during the initial course of his treatment. It is anticipated that the patient will then receive a boost for an additional 5.4 gray. The anticipated total dose therefore will be 50.4 gray.  Special treatment procedure The patient will receive chemotherapy during the course of radiation treatment. The patient may experience increased or overlapping toxicity due to this combined-modality approach and the patient will be monitored for such problems. This may include extra lab work as necessary. This therefore constitutes a special treatment procedure.    ________________________________  Radene Gunning, MD, PhD

## 2013-03-16 NOTE — Telephone Encounter (Signed)
RECEIVED A FAX FROM [facility] CONCERNING A CONFIRMATION OF PRESCRIPTION SHIPMENT FOR XELODA ON 03/15/13.

## 2013-03-16 NOTE — Addendum Note (Signed)
Encounter addended by: Eduardo Osier, RN on: 03/16/2013  9:08 AM<BR>     Documentation filed: Charges VN

## 2013-03-22 ENCOUNTER — Ambulatory Visit
Admission: RE | Admit: 2013-03-22 | Discharge: 2013-03-22 | Disposition: A | Discharge status: Home / Self Care | Payer: BC Managed Care – PPO | Source: Ambulatory Visit | Attending: Radiation Oncology | Admitting: Radiation Oncology

## 2013-03-22 DIAGNOSIS — C20 Malignant neoplasm of rectum: Secondary | ICD-10-CM

## 2013-03-22 NOTE — Progress Notes (Signed)
  Radiation Oncology         [phone]) [phone] ________________________________  Name: Angela Schmidt MRN: [id number]  Date: 03/22/2013  DOB: 10-Apr-1961  Simulation Verification Note   NARRATIVE: The patient was brought to the treatment unit and placed in the planned treatment position. The clinical setup was verified. Then port films were obtained and uploaded to the radiation oncology medical record software.  The treatment beams were carefully compared against the planned radiation fields. The position, location, and shape of the radiation fields was reviewed. The targeted volume of tissue appears to be appropriately covered by the radiation beams. Based on my personal review, I approved the simulation verification. The patient's treatment will proceed as planned.  ________________________________   Radene Gunning, MD, PhD

## 2013-03-23 ENCOUNTER — Ambulatory Visit
Admission: RE | Admit: 2013-03-23 | Discharge: 2013-03-23 | Disposition: A | Discharge status: Home / Self Care | Payer: BC Managed Care – PPO | Source: Ambulatory Visit | Attending: Radiation Oncology | Admitting: Radiation Oncology

## 2013-03-23 ENCOUNTER — Telehealth: Payer: Self-pay | Admitting: *Deleted

## 2013-03-23 NOTE — Telephone Encounter (Signed)
sw pt informed her that [facility] changed her appt d/t from 04/07/13 to 04/06/13. gv appt d/t for 04/06/13 w/albs@ 3pm and 3:30pm ov.the patient is aware...td

## 2013-03-24 ENCOUNTER — Ambulatory Visit
Admission: RE | Admit: 2013-03-24 | Discharge: 2013-03-24 | Disposition: A | Discharge status: Home / Self Care | Payer: BC Managed Care – PPO | Source: Ambulatory Visit | Attending: Radiation Oncology | Admitting: Radiation Oncology

## 2013-03-24 VITALS — BP 104/62 | HR 60 | Temp 97.9°F | Ht 63.0 in | Wt 111.1 lb

## 2013-03-24 DIAGNOSIS — C20 Malignant neoplasm of rectum: Secondary | ICD-10-CM

## 2013-03-24 NOTE — Progress Notes (Signed)
   Department of Radiation Oncology  Phone:  [phone] Fax:        [phone]  Weekly Treatment Note    Name: Angela Schmidt Date: 03/24/2013 MRN: [id number] DOB: 1961-05-11   Current dose: 5.4 Gy  Current fraction: 3   MEDICATIONS: Current Outpatient Prescriptions  Medication Sig Dispense Refill  . capecitabine (XELODA) 500 MG tablet Take 3 tablets (=1500mg ) in AM; then take 2 tablets (=1000mg ) in PM for a total of 2500mg  daily --Take on RADIATION DAYS ONLY  140 tablet  0  . traMADol (ULTRAM) 50 MG tablet Take 50 mg by mouth every 6 (six) hours as needed for pain. Takes 1/2 -1 tab daily.       No current facility-administered medications for this encounter.     ALLERGIES: Review of patient's allergies indicates no known allergies.   LABORATORY DATA:  Lab Results  Component Value Date   WBC 9.8 02/22/2013   HGB 15.7* 02/22/2013   HCT 47.1* 02/22/2013   MCV 96.3 02/22/2013   PLT 298.0 02/22/2013   Lab Results  Component Value Date   NA 141 02/22/2013   K 4.3 02/22/2013   CL 108 02/22/2013   CO2 28 02/22/2013   Lab Results  Component Value Date   ALT 19 02/22/2013   AST 20 02/22/2013   ALKPHOS 81 02/22/2013   BILITOT 1.0 02/22/2013     NARRATIVE: Angela Schmidt was seen today for weekly treatment management. The chart was checked and the patient's films were reviewed. The patient is doing well with treatment. No difficulty so far. She has had some alternating constipation and diarrhea.  PHYSICAL EXAMINATION: height is 5\' 3"  (1.6 m) and weight is 111 lb 1.6 oz (50.395 kg). Her temperature is 97.9 F (36.6 C). Her blood pressure is 104/62 and her pulse is 60.        ASSESSMENT: The patient is doing satisfactorily with treatment.  PLAN: We will continue with the patient's radiation treatment as planned. The patient will try Metamucil to see if this will help normalize her bowel movements.

## 2013-03-24 NOTE — Progress Notes (Signed)
Angela Schmidt here for weekly under treat visit.  She has had 3 fractions to her rectum.  She denies pain.  She did have nausea today.  She is on xeloda and does not have anything for nausea.   She reports a combination of constipation for 2 days and diarrhea for 3 days.  This has been going on for a while now.  She was given the Radiation Therapy and You book and discussed the side effects of radiation including diarrhea, fatigue, hair loss, nausea and bladder changes.  She will be given a sitz bath and was instructed to use it 2-3 times a day if she has discomfort.

## 2013-03-25 ENCOUNTER — Ambulatory Visit
Admission: RE | Admit: 2013-03-25 | Discharge: 2013-03-25 | Disposition: A | Discharge status: Home / Self Care | Payer: BC Managed Care – PPO | Source: Ambulatory Visit | Attending: Radiation Oncology | Admitting: Radiation Oncology

## 2013-03-25 ENCOUNTER — Telehealth: Payer: Self-pay | Admitting: Family Medicine

## 2013-03-25 NOTE — Telephone Encounter (Signed)
Patient has not yet scheduled her screening mammogram.  She is currently undergoing treatment for rectal cancer, and had a CT chest performed and was told everything is clear of cancer.  Patient is asking since she just had a CT of her chest, does she still need to have a mammogram performed this year?  Please advise.

## 2013-03-26 ENCOUNTER — Ambulatory Visit
Admission: RE | Admit: 2013-03-26 | Discharge: 2013-03-26 | Disposition: A | Discharge status: Home / Self Care | Payer: BC Managed Care – PPO | Source: Ambulatory Visit | Attending: Radiation Oncology | Admitting: Radiation Oncology

## 2013-03-26 ENCOUNTER — Telehealth: Payer: Self-pay | Admitting: *Deleted

## 2013-03-26 NOTE — Addendum Note (Signed)
Encounter addended by: Bernell List on: 03/26/2013 12:28 PM<BR>     Documentation filed: Charges VN

## 2013-03-26 NOTE — Telephone Encounter (Signed)
Spoke with patient by phone to check in and see how she is doing.  She stated she is doing well and feels okay except for feeling extra fatigue.  She stated she is eating and drinking okay.  She denies constipation, diarrhea, or other problems.  She denies any barriers to care at present time and thanked this Charity fundraiser for calling.  Will continue to follow prn.

## 2013-03-28 NOTE — Telephone Encounter (Signed)
Yes if she is due for one, please arrange

## 2013-03-29 ENCOUNTER — Other Ambulatory Visit: Payer: Self-pay | Admitting: *Deleted

## 2013-03-29 ENCOUNTER — Ambulatory Visit
Admission: RE | Admit: 2013-03-29 | Discharge: 2013-03-29 | Disposition: A | Discharge status: Home / Self Care | Payer: BC Managed Care – PPO | Source: Ambulatory Visit | Attending: Radiation Oncology | Admitting: Radiation Oncology

## 2013-03-29 VITALS — BP 103/60 | HR 71 | Temp 98.3°F

## 2013-03-29 DIAGNOSIS — C20 Malignant neoplasm of rectum: Secondary | ICD-10-CM

## 2013-03-29 MED ORDER — FLUCONAZOLE 100 MG PO TABS: 100.0000 mg | ORAL_TABLET | Freq: Every day | ORAL | Status: DC

## 2013-03-29 NOTE — Progress Notes (Signed)
   Department of Radiation Oncology  Phone:  [phone] Fax:        [phone]  Weekly Treatment Note    Name: Angela Schmidt Date: 03/29/2013 MRN: [id number] DOB: [birth year]   Current dose: 10.8 Gy  Current fraction: 6   MEDICATIONS: Current Outpatient Prescriptions  Medication Sig Dispense Refill  . capecitabine (XELODA) 500 MG tablet Take 3 tablets (=1500mg ) in AM; then take 2 tablets (=1000mg ) in PM for a total of 2500mg  daily --Take on RADIATION DAYS ONLY  140 tablet  0  . traMADol (ULTRAM) 50 MG tablet Take 50 mg by mouth every 6 (six) hours as needed for pain. Takes 1/2 -1 tab daily.       No current facility-administered medications for this encounter.     ALLERGIES: Review of patient's allergies indicates no known allergies.   LABORATORY DATA:  Lab Results  Component Value Date   WBC 9.8 02/22/2013   HGB 15.7* 02/22/2013   HCT 47.1* 02/22/2013   MCV 96.3 02/22/2013   PLT 298.0 02/22/2013   Lab Results  Component Value Date   NA 141 02/22/2013   K 4.3 02/22/2013   CL 108 02/22/2013   CO2 28 02/22/2013   Lab Results  Component Value Date   ALT 19 02/22/2013   AST 20 02/22/2013   ALKPHOS 81 02/22/2013   BILITOT 1.0 02/22/2013     NARRATIVE: Angela Schmidt was seen today for weekly treatment management. The chart was checked and the patient's films were reviewed. The patient complains of a sore throat which developed over the last day. She was seen by nursing and was found to have thrush on their exam. Otherwise she is doing well with no problems so far.  PHYSICAL EXAMINATION: temperature is 98.3 F (36.8 C). Her blood pressure is 103/60 and her pulse is 71.      Leighton Parody is present in the posterior oral cavity which is fairly confluent.  ASSESSMENT: The patient is doing satisfactorily with treatment.  PLAN: We will continue with the patient's radiation treatment as planned. I have given the patient a prescription for Diflucan.

## 2013-03-29 NOTE — Progress Notes (Signed)
Met with patient to assess for barriers to care.  She denies any needs like transportation, financial, etc.  Referral was made to dietician for education on diet during treatment.  Patient has contact information and verified resources to call if problems arise.  Will continue to follow as needed.

## 2013-03-29 NOTE — Progress Notes (Signed)
Angela Schmidt here due to pain in her throat.  She has had 6 fractions to her rectum.  She rates the pain at a 3/10.  She described the pain as scratchy and feels like lumps in her throat.  She said it started yesterday.  On inspection, she has white, patchy areas on the roof of her mouth and a grey coating on her tongue.  Question thrush.

## 2013-03-29 NOTE — Addendum Note (Signed)
Encounter addended by: Jonna Coup, MD on: 03/29/2013  9:40 PM<BR>     Documentation filed: Orders

## 2013-03-30 ENCOUNTER — Ambulatory Visit
Admission: RE | Admit: 2013-03-30 | Discharge: 2013-03-30 | Disposition: A | Discharge status: Home / Self Care | Payer: BC Managed Care – PPO | Source: Ambulatory Visit | Attending: Radiation Oncology | Admitting: Radiation Oncology

## 2013-03-30 ENCOUNTER — Telehealth: Payer: Self-pay | Admitting: Oncology

## 2013-03-30 NOTE — Telephone Encounter (Signed)
LMONVM OF THE PT REGARDING HER APPT WITH THE DIETITCIAN.

## 2013-03-31 ENCOUNTER — Ambulatory Visit
Admission: RE | Admit: 2013-03-31 | Discharge: 2013-03-31 | Disposition: A | Discharge status: Home / Self Care | Payer: BC Managed Care – PPO | Source: Ambulatory Visit | Attending: Radiation Oncology | Admitting: Radiation Oncology

## 2013-04-01 ENCOUNTER — Ambulatory Visit
Admission: RE | Admit: 2013-04-01 | Discharge: 2013-04-01 | Disposition: A | Discharge status: Home / Self Care | Payer: BC Managed Care – PPO | Source: Ambulatory Visit | Attending: Radiation Oncology | Admitting: Radiation Oncology

## 2013-04-02 ENCOUNTER — Ambulatory Visit
Admission: RE | Admit: 2013-04-02 | Discharge: 2013-04-02 | Disposition: A | Discharge status: Home / Self Care | Payer: BC Managed Care – PPO | Source: Ambulatory Visit | Attending: Radiation Oncology | Admitting: Radiation Oncology

## 2013-04-05 ENCOUNTER — Ambulatory Visit
Admission: RE | Admit: 2013-04-05 | Discharge: 2013-04-05 | Disposition: A | Discharge status: Home / Self Care | Payer: BC Managed Care – PPO | Source: Ambulatory Visit | Attending: Radiation Oncology | Admitting: Radiation Oncology

## 2013-04-06 ENCOUNTER — Ambulatory Visit
Admission: RE | Admit: 2013-04-06 | Discharge: 2013-04-06 | Disposition: A | Discharge status: Home / Self Care | Payer: BC Managed Care – PPO | Source: Ambulatory Visit | Attending: Radiation Oncology | Admitting: Radiation Oncology

## 2013-04-06 ENCOUNTER — Encounter: Payer: Self-pay | Admitting: Radiation Oncology

## 2013-04-06 ENCOUNTER — Ambulatory Visit (HOSPITAL_BASED_OUTPATIENT_CLINIC_OR_DEPARTMENT_OTHER): Payer: BC Managed Care – PPO | Admitting: Oncology

## 2013-04-06 ENCOUNTER — Telehealth: Payer: Self-pay

## 2013-04-06 ENCOUNTER — Other Ambulatory Visit (HOSPITAL_BASED_OUTPATIENT_CLINIC_OR_DEPARTMENT_OTHER): Payer: BC Managed Care – PPO | Admitting: Lab

## 2013-04-06 VITALS — BP 101/70 | HR 62 | Temp 97.9°F | Ht 63.0 in | Wt 108.5 lb

## 2013-04-06 VITALS — BP 107/73 | HR 73 | Temp 97.4°F | Resp 18 | Ht 63.0 in | Wt 108.4 lb

## 2013-04-06 DIAGNOSIS — C20 Malignant neoplasm of rectum: Secondary | ICD-10-CM

## 2013-04-06 DIAGNOSIS — F17200 Nicotine dependence, unspecified, uncomplicated: Secondary | ICD-10-CM

## 2013-04-06 DIAGNOSIS — M069 Rheumatoid arthritis, unspecified: Secondary | ICD-10-CM

## 2013-04-06 DIAGNOSIS — R911 Solitary pulmonary nodule: Secondary | ICD-10-CM

## 2013-04-06 LAB — CBC WITH DIFFERENTIAL/PLATELET
Basophils Absolute: 0 10*3/uL (ref 0.0–0.1)
EOS%: 13.2 % — ABNORMAL HIGH (ref 0.0–7.0)
Eosinophils Absolute: 0.6 10*3/uL — ABNORMAL HIGH (ref 0.0–0.5)
HCT: 41.2 % (ref 34.8–46.6)
HGB: 14.1 g/dL (ref 11.6–15.9)
LYMPH%: 20.8 % (ref 14.0–49.7)
MCH: 32.4 pg (ref 25.1–34.0)
MCV: 94.6 fL (ref 79.5–101.0)
MONO%: 9.8 % (ref 0.0–14.0)
NEUT#: 2.5 10*3/uL (ref 1.5–6.5)
NEUT%: 55.7 % (ref 38.4–76.8)
Platelets: 206 10*3/uL (ref 145–400)
RDW: 13 % (ref 11.2–14.5)

## 2013-04-06 NOTE — Progress Notes (Signed)
Ms. Trego has received 12 fractions to her rectum.  She denies any rectal bleeding, nor pain in this area and states intermittent bouts of loose stools and intermittent "queasiness".  She has imodium as needed, but states she does not need an antiemetic at this point, but will inform us if she needs it in the future.

## 2013-04-06 NOTE — Telephone Encounter (Signed)
gv and printed appt sched and avs for pt. Misty Stanley in a meeting at 3.15 on 9.4.14

## 2013-04-06 NOTE — Progress Notes (Signed)
   [facility]    OFFICE PROGRESS NOTE   INTERVAL HISTORY:   She returns as scheduled. She began Xeloda and radiation on 03/22/2013. No mouth sores or hand/foot pain. She reports developing mild diarrhea in the mornings beginning several days after the start of treatment. She reports 4-6 small volume stools every 2-3 days. She is not taking Imodium. Mild nausea.  Objective:  Vital signs in last 24 hours:  Blood pressure 107/73, pulse 73, temperature 97.4 F (36.3 C), temperature source Oral, resp. rate 18, height 5\' 3"  (1.6 m), weight 108 lb 6.4 oz (49.17 kg).    HEENT: No thrush or ulcers Resp: Lungs clear bilaterally Cardio: Regular rate and rhythm GI: No hepatomegaly, nontender Vascular: No leg edema  Skin: Area of superficial skin breakdown at the left thumb-she report a "burn" in this area from the lawnmower. Callus formation over the soles. No other areas of skin breakdown. No significant erythema.    Lab Results:  Lab Results  Component Value Date   WBC 4.6 04/06/2013   HGB 14.1 04/06/2013   HCT 41.2 04/06/2013   MCV 94.6 04/06/2013   PLT 206 04/06/2013   ANC 2.5    Medications: I have reviewed the patient's current medications.  Assessment/Plan: 1. Rectal cancer, cycle stage III-uT3,uN1, measured at 7 cm from the anal verge, no evidence for metastatic disease on CTs of the chest, abdomen, and pelvis 02/24/2013. Elevated CEA  -Initiation of neoadjuvant capecitabine and radiation on 03/22/2013 2. "Rheumatoid "arthritis  3. Ongoing tobacco use-she is working on smoking cessation, now using a nicotine patch 4. Trachea/bronchial nodularity noted on the chest CT 02/24/2013 5. diarrhea-mild at present, potentially related to capecitabine   Disposition:  She appears to be tolerating the chemotherapy and radiation well. I encouraged her to use Imodium as needed for diarrhea. If the diarrhea increases she will contact us. I encouraged her to increase her  fluid intake. She will return for an office visit on 04/22/2013.   Thornton Papas, MD  04/06/2013  3:37 PM

## 2013-04-06 NOTE — Progress Notes (Signed)
   Department of Radiation Oncology  Phone:  [phone] Fax:        [phone]  Weekly Treatment Note    Name: Shimika Ames Date: 04/06/2013 MRN: [id number] DOB: 20-Aug-1960   Current dose: 21.6 Gy  Current fraction: 12   MEDICATIONS: Current Outpatient Prescriptions  Medication Sig Dispense Refill  . capecitabine (XELODA) 500 MG tablet Take 3 tablets (=1500mg ) in AM; then take 2 tablets (=1000mg ) in PM for a total of 2500mg  daily --Take on RADIATION DAYS ONLY  140 tablet  0  . fluconazole (DIFLUCAN) 100 MG tablet Take 1 tablet (100 mg total) by mouth daily. Take 2 tabs x 1, then 1 tab QD x 9 additional days.  11 tablet  0  . nicotine (NICODERM CQ - DOSED IN MG/24 HOURS) 21 mg/24hr patch Place 1 patch onto the skin daily.      . traMADol (ULTRAM) 50 MG tablet Take 50 mg by mouth every 6 (six) hours as needed for pain. Takes 1/2 -1 tab daily.       No current facility-administered medications for this encounter.     ALLERGIES: Review of patient's allergies indicates no known allergies.   LABORATORY DATA:  Lab Results  Component Value Date   WBC 4.6 04/06/2013   HGB 14.1 04/06/2013   HCT 41.2 04/06/2013   MCV 94.6 04/06/2013   PLT 206 04/06/2013   Lab Results  Component Value Date   NA 141 02/22/2013   K 4.3 02/22/2013   CL 108 02/22/2013   CO2 28 02/22/2013   Lab Results  Component Value Date   ALT 19 02/22/2013   AST 20 02/22/2013   ALKPHOS 81 02/22/2013   BILITOT 1.0 02/22/2013     NARRATIVE: Julia Alkhatib was seen today for weekly treatment management. The chart was checked and the patient's films were reviewed. The patient is doing well at this time. No complaints today. No diarrhea/GI toxicity.  PHYSICAL EXAMINATION: height is 5\' 3"  (1.6 m) and weight is 108 lb 8 oz (49.215 kg). Her temperature is 97.9 F (36.6 C). Her blood pressure is 101/70 and her pulse is 62.        ASSESSMENT: The patient is doing satisfactorily with treatment.  PLAN: We will continue with  the patient's radiation treatment as planned.

## 2013-04-07 ENCOUNTER — Other Ambulatory Visit: Payer: BC Managed Care – PPO | Admitting: Lab

## 2013-04-07 ENCOUNTER — Ambulatory Visit: Payer: BC Managed Care – PPO | Admitting: Oncology

## 2013-04-07 ENCOUNTER — Ambulatory Visit
Admission: RE | Admit: 2013-04-07 | Discharge: 2013-04-07 | Disposition: A | Discharge status: Home / Self Care | Payer: BC Managed Care – PPO | Source: Ambulatory Visit | Attending: Radiation Oncology | Admitting: Radiation Oncology

## 2013-04-07 ENCOUNTER — Encounter: Payer: BC Managed Care – PPO | Admitting: Nutrition

## 2013-04-08 ENCOUNTER — Other Ambulatory Visit: Payer: Self-pay | Admitting: Radiation Oncology

## 2013-04-08 ENCOUNTER — Ambulatory Visit
Admission: RE | Admit: 2013-04-08 | Discharge: 2013-04-08 | Disposition: A | Discharge status: Home / Self Care | Payer: BC Managed Care – PPO | Source: Ambulatory Visit | Attending: Radiation Oncology | Admitting: Radiation Oncology

## 2013-04-08 ENCOUNTER — Ambulatory Visit: Payer: BC Managed Care – PPO | Admitting: Oncology

## 2013-04-08 MED ORDER — PROCHLORPERAZINE MALEATE 10 MG PO TABS: 10.0000 mg | ORAL_TABLET | Freq: Four times a day (QID) | ORAL | Status: DC | PRN

## 2013-04-08 NOTE — Progress Notes (Signed)
Pt in nursing after radiation treatment today requesting med for nausea which she states has just begun. She states "I was offered a prescription earlier but didn't need it then." Verified pt's pharmacy of choice as [facility] on [address]. Informed pt will give her request to Dr Mitzi Hansen who can send prescription electronically. Pt verbalized agreement, understanding. Discussed w/Dr Mitzi Hansen who stated he would take care of it.

## 2013-04-09 ENCOUNTER — Other Ambulatory Visit: Payer: Self-pay | Admitting: *Deleted

## 2013-04-09 ENCOUNTER — Ambulatory Visit
Admission: RE | Admit: 2013-04-09 | Discharge: 2013-04-09 | Disposition: A | Discharge status: Home / Self Care | Payer: BC Managed Care – PPO | Source: Ambulatory Visit | Attending: Radiation Oncology | Admitting: Radiation Oncology

## 2013-04-09 ENCOUNTER — Telehealth: Payer: Self-pay | Admitting: *Deleted

## 2013-04-09 ENCOUNTER — Other Ambulatory Visit: Payer: Self-pay | Admitting: Medical Oncology

## 2013-04-09 MED ORDER — CAPECITABINE 500 MG PO TABS: ORAL_TABLET | ORAL | Status: DC

## 2013-04-09 NOTE — Telephone Encounter (Signed)
THIS REFILL REQUEST FOR XELODA WAS GIVEN TO DR.SHERRILL'S NURSE, TANYA WHITLOCK,RN. 

## 2013-04-09 NOTE — Telephone Encounter (Signed)
Called pt to follow up on Xeloda therapy. She denies hand/foot symptoms, mouth sores or diarrhea. Will need 8 day supply of Xeloda to complete radiation therapy. Rx faxed to [facility].

## 2013-04-12 ENCOUNTER — Ambulatory Visit
Admission: RE | Admit: 2013-04-12 | Discharge: 2013-04-12 | Disposition: A | Discharge status: Home / Self Care | Payer: BC Managed Care – PPO | Source: Ambulatory Visit | Attending: Radiation Oncology | Admitting: Radiation Oncology

## 2013-04-13 ENCOUNTER — Ambulatory Visit
Admission: RE | Admit: 2013-04-13 | Discharge: 2013-04-13 | Disposition: A | Discharge status: Home / Self Care | Payer: BC Managed Care – PPO | Source: Ambulatory Visit | Attending: Radiation Oncology | Admitting: Radiation Oncology

## 2013-04-14 ENCOUNTER — Ambulatory Visit
Admission: RE | Admit: 2013-04-14 | Discharge: 2013-04-14 | Disposition: A | Discharge status: Home / Self Care | Payer: BC Managed Care – PPO | Source: Ambulatory Visit | Attending: Radiation Oncology | Admitting: Radiation Oncology

## 2013-04-15 ENCOUNTER — Ambulatory Visit
Admission: RE | Admit: 2013-04-15 | Discharge: 2013-04-15 | Disposition: A | Discharge status: Home / Self Care | Payer: BC Managed Care – PPO | Source: Ambulatory Visit | Attending: Radiation Oncology | Admitting: Radiation Oncology

## 2013-04-16 ENCOUNTER — Ambulatory Visit
Admission: RE | Admit: 2013-04-16 | Discharge: 2013-04-16 | Disposition: A | Discharge status: Home / Self Care | Payer: BC Managed Care – PPO | Source: Ambulatory Visit | Attending: Radiation Oncology | Admitting: Radiation Oncology

## 2013-04-16 ENCOUNTER — Encounter: Payer: Self-pay | Admitting: Radiation Oncology

## 2013-04-16 ENCOUNTER — Encounter: Payer: Self-pay | Admitting: *Deleted

## 2013-04-16 VITALS — BP 106/53 | HR 57 | Temp 97.9°F | Ht 63.0 in | Wt 108.5 lb

## 2013-04-16 DIAGNOSIS — C20 Malignant neoplasm of rectum: Secondary | ICD-10-CM

## 2013-04-16 MED ORDER — ONDANSETRON HCL 8 MG PO TABS: 8.0000 mg | ORAL_TABLET | Freq: Two times a day (BID) | ORAL | Status: DC | PRN

## 2013-04-16 NOTE — Progress Notes (Signed)
Angela Schmidt c/o intermittent sharp.abdominal pain, bloating and gas.  She has nausea and obtains "minimal relief with compazine.  Having loose, not watery stools and is taking Imodium.   She has itching and burning in the rectal area, but denies any bleeding.  C/o intermittent fatigue and has to take naps sometimes.

## 2013-04-16 NOTE — Progress Notes (Signed)
   Department of Radiation Oncology  Phone:  [phone] Fax:        [phone]  Weekly Treatment Note    Name: Angela Schmidt Date: 04/16/2013 MRN: [id number] DOB: 05-26-1961   Current dose: 36 Gy  Current fraction: 20   MEDICATIONS: Current Outpatient Prescriptions  Medication Sig Dispense Refill  . capecitabine (XELODA) 500 MG tablet Take 3 tablets (=1500mg ) in AM; then take 2 tablets (=1000mg ) in PM for a total of 2500mg  daily --Take on RADIATION DAYS ONLY  40 tablet  0  . nicotine (NICODERM CQ - DOSED IN MG/24 HOURS) 21 mg/24hr patch Place 1 patch onto the skin daily.      . prochlorperazine (COMPAZINE) 10 MG tablet Take 1 tablet (10 mg total) by mouth every 6 (six) hours as needed.  30 tablet  0  . traMADol (ULTRAM) 50 MG tablet Take 50 mg by mouth every 6 (six) hours as needed for pain. Takes 1/2 -1 tab daily.       No current facility-administered medications for this encounter.     ALLERGIES: Review of patient's allergies indicates no known allergies.   LABORATORY DATA:  Lab Results  Component Value Date   WBC 4.6 04/06/2013   HGB 14.1 04/06/2013   HCT 41.2 04/06/2013   MCV 94.6 04/06/2013   PLT 206 04/06/2013   Lab Results  Component Value Date   NA 141 02/22/2013   K 4.3 02/22/2013   CL 108 02/22/2013   CO2 28 02/22/2013   Lab Results  Component Value Date   ALT 19 02/22/2013   AST 20 02/22/2013   ALKPHOS 81 02/22/2013   BILITOT 1.0 02/22/2013     NARRATIVE: Angela Schmidt was seen today for weekly treatment management. The chart was checked and the patient's films were reviewed. The patient has been having some loose stools. Taking Imodium for this which appears to the working satisfactory. She is having some abdominal pain associated with some bloating/gas. She has not taken anything for this. She also has had some nausea. She is taking Compazine but this is not working adequately.  PHYSICAL EXAMINATION: height is 5\' 3"  (1.6 m) and weight is 108 lb 8 oz (49.215  kg). Her temperature is 97.9 F (36.6 C). Her blood pressure is 106/53 and her pulse is 57.        ASSESSMENT: The patient is doing satisfactorily with treatment.  PLAN: We will continue with the patient's radiation treatment as planned. The patient will begin taking some sitz baths which we discussed for some irritation in the rectal area. I have given her a prescription for Zofran given the lack of relief of nausea with Compazine. The patient will take Gas-X medication for the bloating.

## 2013-04-16 NOTE — Progress Notes (Signed)
Received confirmation of Xeloda shipment from [facility]. Shipped 04/15/13

## 2013-04-20 ENCOUNTER — Ambulatory Visit
Admission: RE | Admit: 2013-04-20 | Discharge: 2013-04-20 | Disposition: A | Discharge status: Home / Self Care | Payer: BC Managed Care – PPO | Source: Ambulatory Visit | Attending: Radiation Oncology | Admitting: Radiation Oncology

## 2013-04-21 ENCOUNTER — Ambulatory Visit
Admission: RE | Admit: 2013-04-21 | Discharge: 2013-04-21 | Disposition: A | Discharge status: Home / Self Care | Payer: BC Managed Care – PPO | Source: Ambulatory Visit | Attending: Radiation Oncology | Admitting: Radiation Oncology

## 2013-04-21 ENCOUNTER — Encounter: Payer: Self-pay | Admitting: Radiation Oncology

## 2013-04-21 ENCOUNTER — Telehealth: Payer: Self-pay | Admitting: *Deleted

## 2013-04-21 NOTE — Telephone Encounter (Signed)
Faxed to discontinue order of Xeloda. Patient has enough supply at home to complete treatment plan with RT through 04/29/13

## 2013-04-22 ENCOUNTER — Ambulatory Visit: Payer: BC Managed Care – PPO | Admitting: Nutrition

## 2013-04-22 ENCOUNTER — Ambulatory Visit
Admission: RE | Admit: 2013-04-22 | Discharge: 2013-04-22 | Disposition: A | Discharge status: Home / Self Care | Payer: BC Managed Care – PPO | Source: Ambulatory Visit | Attending: Radiation Oncology | Admitting: Radiation Oncology

## 2013-04-22 ENCOUNTER — Other Ambulatory Visit (HOSPITAL_BASED_OUTPATIENT_CLINIC_OR_DEPARTMENT_OTHER): Payer: BC Managed Care – PPO | Admitting: Lab

## 2013-04-22 ENCOUNTER — Other Ambulatory Visit: Payer: BC Managed Care – PPO | Admitting: Lab

## 2013-04-22 ENCOUNTER — Ambulatory Visit (HOSPITAL_BASED_OUTPATIENT_CLINIC_OR_DEPARTMENT_OTHER): Payer: BC Managed Care – PPO | Admitting: Lab

## 2013-04-22 ENCOUNTER — Ambulatory Visit (HOSPITAL_BASED_OUTPATIENT_CLINIC_OR_DEPARTMENT_OTHER): Payer: BC Managed Care – PPO | Admitting: Nurse Practitioner

## 2013-04-22 ENCOUNTER — Telehealth: Payer: Self-pay | Admitting: Oncology

## 2013-04-22 VITALS — BP 118/64 | HR 71 | Temp 97.5°F | Resp 18 | Ht 63.0 in | Wt 107.2 lb

## 2013-04-22 DIAGNOSIS — N898 Other specified noninflammatory disorders of vagina: Secondary | ICD-10-CM

## 2013-04-22 DIAGNOSIS — C20 Malignant neoplasm of rectum: Secondary | ICD-10-CM

## 2013-04-22 DIAGNOSIS — R350 Frequency of micturition: Secondary | ICD-10-CM

## 2013-04-22 DIAGNOSIS — L293 Anogenital pruritus, unspecified: Secondary | ICD-10-CM

## 2013-04-22 LAB — URINALYSIS, MICROSCOPIC - CHCC
Bilirubin (Urine): NEGATIVE
Ketones: NEGATIVE mg/dL
Protein: 30 mg/dL
pH: 6 (ref 4.6–8.0)

## 2013-04-22 LAB — CBC WITH DIFFERENTIAL/PLATELET
EOS%: 12.5 % — ABNORMAL HIGH (ref 0.0–7.0)
Eosinophils Absolute: 0.5 10*3/uL (ref 0.0–0.5)
LYMPH%: 10.1 % — ABNORMAL LOW (ref 14.0–49.7)
MCH: 32.6 pg (ref 25.1–34.0)
MCV: 95.5 fL (ref 79.5–101.0)
MONO%: 13.4 % (ref 0.0–14.0)
Platelets: 196 10*3/uL (ref 145–400)
RBC: 4.11 10*6/uL (ref 3.70–5.45)
RDW: 13.8 % (ref 11.2–14.5)

## 2013-04-22 LAB — COMPREHENSIVE METABOLIC PANEL (CC13)
AST: 25 U/L (ref 5–34)
Albumin: 3.4 g/dL — ABNORMAL LOW (ref 3.5–5.0)
Alkaline Phosphatase: 84 U/L (ref 40–150)
BUN: 8.8 mg/dL (ref 7.0–26.0)
Glucose: 91 mg/dl (ref 70–140)
Potassium: 3.8 mEq/L (ref 3.5–5.1)
Sodium: 141 mEq/L (ref 136–145)
Total Bilirubin: 0.38 mg/dL (ref 0.20–1.20)
Total Protein: 6.6 g/dL (ref 6.4–8.3)

## 2013-04-22 MED ORDER — FLUCONAZOLE 150 MG PO TABS: ORAL_TABLET | ORAL | Status: DC

## 2013-04-22 MED ORDER — ONDANSETRON HCL 8 MG PO TABS: 8.0000 mg | ORAL_TABLET | Freq: Three times a day (TID) | ORAL | Status: DC | PRN

## 2013-04-22 NOTE — Telephone Encounter (Signed)
Gave pt appt for november and sent to labs today

## 2013-04-22 NOTE — Progress Notes (Signed)
OFFICE PROGRESS NOTE  Interval history:  Angela Schmidt returns as scheduled. She continues radiation and Xeloda. She will complete the course of radiation on 04/29/2013. She is having intermittent nausea partially relieved with Compazine. No mouth sores. Periodic diarrhea. She has had recent urinary frequency and "tingling" with urination. No fever. She also notes "itching and burning" in the vaginal area. She has vaginal discharge. She is concerned she has a yeast infection. She has been diagnosed with "conjunctivitis" of the left eye and is currently on TobraDex eyedrops.   Objective: Blood pressure 118/64, pulse 71, temperature 97.5 F (36.4 C), temperature source Oral, resp. rate 18, height 5\' 3"  (1.6 m), weight 107 lb 3.2 oz (48.626 kg).  Left conjunctival erythema. Lungs clear. Regular cardiac rhythm. Abdomen soft and nontender. No hepatomegaly. Extremities without edema. Mild erythema extending from the labia to the perineum. No skin breakdown.  Lab Results: Lab Results  Component Value Date   WBC 3.7* 04/22/2013   HGB 13.4 04/22/2013   HCT 39.2 04/22/2013   MCV 95.5 04/22/2013   PLT 196 04/22/2013    Chemistry:    Chemistry      Component Value Date/Time   NA 141 04/22/2013 1341   NA 141 02/22/2013 1133   K 3.8 04/22/2013 1341   K 4.3 02/22/2013 1133   CL 108 02/22/2013 1133   CO2 26 04/22/2013 1341   CO2 28 02/22/2013 1133   BUN 8.8 04/22/2013 1341   BUN 7 02/22/2013 1133   CREATININE 0.8 04/22/2013 1341   CREATININE 0.8 02/22/2013 1133      Component Value Date/Time   CALCIUM 9.0 04/22/2013 1341   CALCIUM 9.1 02/22/2013 1133   ALKPHOS 84 04/22/2013 1341   ALKPHOS 81 02/22/2013 1133   AST 25 04/22/2013 1341   AST 20 02/22/2013 1133   ALT 32 04/22/2013 1341   ALT 19 02/22/2013 1133   BILITOT 0.38 04/22/2013 1341   BILITOT 1.0 02/22/2013 1133       Studies/Results: No results found.  Medications: I have reviewed the patient's current medications.  Assessment/Plan:  1. Rectal cancer, clinical stage III  (uT3 uN1) measured at 7 cm from the anal verge.   No evidence for metastatic disease on CTs of the chest, abdomen and pelvis 02/24/2013.   Elevated CEA.   Initiation of neoadjuvant Xeloda and radiation on 03/22/2013. 2. "Rheumatoid" arthritis. 3. Tobacco use. 4. Trachea/bronchial nodularity noted on the chest CT 02/24/2013. 5. Conjunctivitis left eye currently on TobraDex eyedrops. 6. Urinary frequency. Question radiation cystitis. We will obtain a urinalysis. 7. Vaginal "itching and burning". Question skin toxicity related to radiation and Xeloda. She is concerned for a vaginal yeast infection. She will take Diflucan 150 mg by mouth x1. 8. Intermittent nausea partially relieved with Compazine. Prescription was sent to her pharmacy for Zofran 8 mg every 8 hours as needed.  Disposition-overall she appears to be tolerating the chemotherapy and radiation well. She is scheduled to complete the course of radiation on 04/29/2013. She understands to discontinue Xeloda coinciding with radiation completion. She has a followup visit with Dr. Romie Levee on 05/04/2013. We will schedule a followup visit with Dr. Truett Perna in 2 months to review the surgical pathology and discuss additional systemic therapy.  Plan reviewed with Dr. Truett Perna.  Lonna Cobb ANP/GNP-BC

## 2013-04-22 NOTE — Progress Notes (Signed)
This patient is a 52 year old female diagnosed with rectal cancer.  She is a patient of Dr. Truett Perna.  Past medical history includes, arthritis, kidney stones, and chickenpox.  Medications include Xeloda.  Labs were reviewed.  Height: 63 inches. Weight: 108.5 pounds.   Usual body weight: 113 pounds in May, 2014. BMI: 19.22.  Patient complains of fatigue and poor appetite.  She also has experienced nausea and approximately 8 loose stools daily for which she takes Imodium.  She was given a new prescription for nausea.  However, she has not started that yet.  Patient is looking for relief from excessive stools.  Nutrition diagnosis: Altered gastrointestinal function related to rectal cancer as evidenced by diarrhea.  Intervention: Patient was educated on strategies for improving nausea, utilizing medications as prescribed by physician, and consuming a low fiber diet.  Patient was encouraged to consume small, frequent meals.  I recommended patient include ensure clear once to twice daily.  Patient was educated on high-protein foods.  Fact sheets and recipes were provided.  Oral nutrition supplement samples provided.  Contact information given.  Teach back method used.  Monitoring, evaluation, goals: Patient will tolerate increased oral intake to minimize further weight loss and improve nutrition impact symptoms.  Next visit: Patient will contact me for questions or concerns.

## 2013-04-23 ENCOUNTER — Ambulatory Visit
Admission: RE | Admit: 2013-04-23 | Discharge: 2013-04-23 | Disposition: A | Discharge status: Home / Self Care | Payer: BC Managed Care – PPO | Source: Ambulatory Visit | Attending: Radiation Oncology | Admitting: Radiation Oncology

## 2013-04-23 ENCOUNTER — Ambulatory Visit: Payer: BC Managed Care – PPO | Admitting: Family Medicine

## 2013-04-23 ENCOUNTER — Other Ambulatory Visit: Payer: Self-pay | Admitting: Nurse Practitioner

## 2013-04-23 VITALS — BP 97/65 | HR 62 | Temp 97.7°F | Ht 63.0 in | Wt 107.3 lb

## 2013-04-23 DIAGNOSIS — R350 Frequency of micturition: Secondary | ICD-10-CM

## 2013-04-23 DIAGNOSIS — C20 Malignant neoplasm of rectum: Secondary | ICD-10-CM

## 2013-04-23 MED ORDER — CIPROFLOXACIN HCL 500 MG PO TABS: 500.0000 mg | ORAL_TABLET | Freq: Two times a day (BID) | ORAL | Status: DC

## 2013-04-23 MED ORDER — BIAFINE EX EMUL
Freq: Two times a day (BID) | CUTANEOUS | Status: DC
  Administered 2013-04-23: 17:00:00 via TOPICAL

## 2013-04-23 MED ORDER — DIPHENOXYLATE-ATROPINE 2.5-0.025 MG PO TABS: 2.0000 | ORAL_TABLET | Freq: Four times a day (QID) | ORAL | Status: DC | PRN

## 2013-04-23 NOTE — Progress Notes (Signed)
Angela Schmidt here for weekly under visit.  She has had 24 fractions to her rectum.  She is having pain in her abdomen that she is rating at a 5/10.  She said it started last week.  She also is having diarrhea frequently.  She does have Imodium but it is not helping.  The dietician recommended tincture of opium.  She reports that the skin on her rectal area is raw.  She has been using a sitz bath frequently.  She would like a cream and biafine has been given with instructions to apply it twice a day after treatment and at bedtime.  She was recently diagnosed with a UTI.  She does have fatigue.

## 2013-04-23 NOTE — Progress Notes (Signed)
   Department of Radiation Oncology  Phone:  [phone] Fax:        [phone]  Weekly Treatment Note    Name: Angela Schmidt Date: 04/23/2013 MRN: [id number] DOB: 03-16-1961   Current dose: 43.2 Gy  Current fraction: 24   MEDICATIONS: Current Outpatient Prescriptions  Medication Sig Dispense Refill  . capecitabine (XELODA) 500 MG tablet Take 3 tablets (=1500mg ) in AM; then take 2 tablets (=1000mg ) in PM for a total of 2500mg  daily --Take on RADIATION DAYS ONLY  40 tablet  0  . ciprofloxacin (CIPRO) 500 MG tablet Take 1 tablet (500 mg total) by mouth 2 (two) times daily.  10 tablet  0  . fluconazole (DIFLUCAN) 150 MG tablet Take 1 tab x 1 dose  1 tablet  0  . nicotine (NICODERM CQ - DOSED IN MG/24 HOURS) 21 mg/24hr patch Place 1 patch onto the skin daily.      . ondansetron (ZOFRAN) 8 MG tablet Take 1 tablet (8 mg total) by mouth every 8 (eight) hours as needed for nausea.  20 tablet  0  . prochlorperazine (COMPAZINE) 10 MG tablet Take 1 tablet (10 mg total) by mouth every 6 (six) hours as needed.  30 tablet  0  . tobramycin-dexamethasone (TOBRADEX) ophthalmic solution Place 1 drop into the left eye 4 (four) times daily.      . traMADol (ULTRAM) 50 MG tablet Take 50 mg by mouth every 6 (six) hours as needed for pain. Takes 1/2 -1 tab daily.       No current facility-administered medications for this encounter.     ALLERGIES: Review of patient's allergies indicates no known allergies.   LABORATORY DATA:  Lab Results  Component Value Date   WBC 3.7* 04/22/2013   HGB 13.4 04/22/2013   HCT 39.2 04/22/2013   MCV 95.5 04/22/2013   PLT 196 04/22/2013   Lab Results  Component Value Date   NA 141 04/22/2013   K 3.8 04/22/2013   CL 108 02/22/2013   CO2 26 04/22/2013   Lab Results  Component Value Date   ALT 32 04/22/2013   AST 25 04/22/2013   ALKPHOS 84 04/22/2013   BILITOT 0.38 04/22/2013     NARRATIVE: Angela Schmidt was seen today for weekly treatment management. The chart was checked  and the patient's films were reviewed. The patient is complaining of some diarrhea. She states that this has not been controlled adequately with Imodium. She states that she has been using up to 8 Imodium per day and is requesting something stronger. She is using sitz baths which are helping with some skin irritation and she is using skin cream.  PHYSICAL EXAMINATION: height is 5\' 3"  (1.6 m) and weight is 107 lb 4.8 oz (48.671 kg). Her temperature is 97.7 F (36.5 C). Her blood pressure is 97/65 and her pulse is 62.        ASSESSMENT: The patient is doing satisfactorily with treatment.  PLAN: We will continue with the patient's radiation treatment as planned.  I have given the patient a prescription for Lomotil. We will begin her boost treatment early next week.

## 2013-04-23 NOTE — Progress Notes (Signed)
  Radiation Oncology         [phone]) [phone] ________________________________  Name: Angela Schmidt MRN: [id number]  Date: 04/21/2013  DOB: 01-22-1961  COMPLEX SIMULATION  NOTE  Diagnosis: rectal cancer  Narrative The patient has initially been planned to receive a course of radiation treatment to a dose of 45 gray in 25 fractions at 1.8 gray per fraction. The patient will now receive a boost to the high risk target volume for an additional 5.4 gray. This will be delivered in 3 fractions at 1.8 gray per fraction and a cone down boost technique will be utilized. To accomplish this, an additional 4 customized blocks have been designed for this purpose. A complex isodose plan is requested to ensure that the high-risk target region receives the appropriate radiation dose and that the nearby normal structures continue to be appropriately spared. The patient's final total dose therefore will be 50.4 gray.   ________________________________ ------------------------------------------------  Radene Gunning, MD, PhD

## 2013-04-26 ENCOUNTER — Ambulatory Visit
Admission: RE | Admit: 2013-04-26 | Discharge: 2013-04-26 | Disposition: A | Discharge status: Home / Self Care | Payer: BC Managed Care – PPO | Source: Ambulatory Visit | Attending: Radiation Oncology | Admitting: Radiation Oncology

## 2013-04-27 ENCOUNTER — Ambulatory Visit
Admission: RE | Admit: 2013-04-27 | Discharge: 2013-04-27 | Disposition: A | Discharge status: Home / Self Care | Payer: BC Managed Care – PPO | Source: Ambulatory Visit | Attending: Radiation Oncology | Admitting: Radiation Oncology

## 2013-04-27 DIAGNOSIS — C20 Malignant neoplasm of rectum: Secondary | ICD-10-CM

## 2013-04-27 NOTE — Progress Notes (Signed)
  Radiation Oncology         ([phone]) [phone] ________________________________  Name: Angela Schmidt MRN: [id number]  Date: 04/27/2013  DOB: 1961-04-21  Simulation Verification Note   NARRATIVE: The patient was brought to the treatment unit and placed in the planned treatment position for the patient's boost treatment. The clinical setup was verified. Then port films were obtained and uploaded to the radiation oncology medical record software.  The treatment beams were carefully compared against the planned radiation fields. The position, location, and shape of the radiation fields was reviewed. The targeted volume of tissue appears to be appropriately covered by the radiation beams. Based on my personal review, I approved the simulation verification. The patient's treatment will proceed as planned.  ________________________________   Radene Gunning, MD, PhD

## 2013-04-28 ENCOUNTER — Ambulatory Visit
Admission: RE | Admit: 2013-04-28 | Discharge: 2013-04-28 | Disposition: A | Discharge status: Home / Self Care | Payer: BC Managed Care – PPO | Source: Ambulatory Visit | Attending: Radiation Oncology | Admitting: Radiation Oncology

## 2013-04-29 ENCOUNTER — Ambulatory Visit
Admission: RE | Admit: 2013-04-29 | Discharge: 2013-04-29 | Disposition: A | Discharge status: Home / Self Care | Payer: BC Managed Care – PPO | Source: Ambulatory Visit | Attending: Radiation Oncology | Admitting: Radiation Oncology

## 2013-04-29 ENCOUNTER — Encounter: Payer: Self-pay | Admitting: Radiation Oncology

## 2013-05-04 ENCOUNTER — Encounter (INDEPENDENT_AMBULATORY_CARE_PROVIDER_SITE_OTHER): Payer: Self-pay | Admitting: General Surgery

## 2013-05-04 ENCOUNTER — Ambulatory Visit (INDEPENDENT_AMBULATORY_CARE_PROVIDER_SITE_OTHER): Payer: BC Managed Care – PPO | Admitting: General Surgery

## 2013-05-04 VITALS — BP 102/64 | HR 68 | Resp 14 | Ht 63.0 in | Wt 104.2 lb

## 2013-05-04 DIAGNOSIS — C20 Malignant neoplasm of rectum: Secondary | ICD-10-CM

## 2013-05-04 MED ORDER — METRONIDAZOLE 500 MG PO TABS: 500.0000 mg | ORAL_TABLET | ORAL | Status: AC

## 2013-05-04 NOTE — Patient Instructions (Signed)
[facility]  ONE-DAY (1) PRE-OP HOME COLON PREP INSTRUCTIONS: ** MIRALAX / GATORADE PREP / FLAGYL**  You must follow the instructions below carefully.  If you have questions or problems, please call and speak to someone in the clinic department at our office:   [phone].     INSTRUCTIONS: 1. Five days prior to your procedure do not eat nuts, popcorn, or fruit with seeds.  Stop all fiber supplements such as Metamucil, Citrucel, etc. 2. Two days before surgery fill the prescription at a pharmacy of your choice and purchase the additional supplies below.         MIRALAX - GATORADE -- DULCOLAX TABS -- FLEET ENEMA:   Purchase a bottle of MIRALAX  (255 gm bottle)    In addition, purchase four (4) DULCOLAX TABLETS (no prescription required- ask the pharmacist if you can't find them)   Purchase one Fleet Enema (Green and white box)    Purchase one 64 oz GATORADE.  (Do NOT purchase red Gatorade; any other flavor is acceptable) and place in refrigerator to get cold.  3.   Day Before Surgery:   6 am: Wash you abdomen with soap and repeat this on the morning of surgery and take 4 Dulcolax tablets   You may only have clear liquids (tea, coffee, juice, broth, jello, soft drinks, gummy bears).  You cannot have solid foods, cream, milk or milk products.  Drink at lease 8 ounces of liquids every hour while awake.   Take the Flagyl prescription as directed at 8 am, 2 pm and 8 pm.  It is helpful to take this with some jello instead of on an empty stomach.  Any flavor is ok, except red jello, which will cause red stools.   Mix the entire bottle of MiraLax and the Gatorade in a large container.    10:00am: Begin drinking the Gatorade mixture until gone (8 oz every 15-30 minutes).      You may suck on a lime wedge or hard candy to "freshen your palate" in between glasses   If you are a diabetic, take your blood sugar reading several time throughout the prep.  Have some juice available to take if your  sugar level gets too low   You may feel chilled while taking the prep.  Have some warm tea or broth to help warm up.   Continue clear liquids until midnight or bedtime  3. The day of your procedure:   Do not eat or drink ANYTHING after midnight before your surgery.     If you take Heart or Blood Pressure medicine, ask the pre-op nurses about these during your preop appointment.   Take a regular Fleet Enema one hour before leaving the come to the hospital.  Try to hold it in 5-10 mins before expelling.   Further pre-operative instructions will be given to you from the hospital.   Expect to be contacted 5-7 days before your surgery.     

## 2013-05-04 NOTE — Progress Notes (Signed)
Angela Schmidt is a 52 y.o. female who is here for a follow up visit regarding her rectal cancer.  She completed her radiation therapy on September 11. She is now here to discuss further surgical management. She did have some GI distress with her radiation treatments. She has taken Imodium for this right now. She has also had some skin breakdown with her radiation.  Objective: Filed Vitals:   05/04/13 1032  BP: 102/64  Pulse: 68  Resp: 14    General appearance: alert and cooperative GI: normal findings: soft, non-tender and Pfannenstiel scar noted DRE: mass palpated at the tip of my index finger. Approximately 5-6 cm from anal verge. Mobile.  Assessment and Plan: Angela Schmidt is a 52 year old female who was found to have a T3 and one cancer by ultrasound. She underwent chemotherapy and radiation.  We discussed her surgery in detail. She will need a laparoscopic low anterior resection with diverting ileostomy. We talked about the risk of rectal surgery which include bleeding, infection, anastomotic leak, damage to ureters, damage to pelvic nerves causing bowel bladder and sexual dysfunction, wound infections an ostomy dysfunction.  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  The surgery and anatomy were described to the patient as well as the risks of surgery and the possible complications.  These include: Bleeding, infection and possible wound complications such as hernia, damage to adjacent structures, leak of surgical connections, which can lead to other surgeries and possibly an ostomy (5-7%), possible need for other procedures, such as abscess drains in radiology, possible prolonged hospital stay, possible diarrhea from removal of part of the colon, possible constipation from narcotics, prolonged fatigue/weakness or appetite loss, possible early recurrence of cancer, possible complications of their medical problems such as heart disease or arrhythmias or lung problems, death  (less than 1%). I believe the patient understands and wishes to proceed with the surgery.     Vanita Panda, MD [facility], [place] [phone]

## 2013-05-06 NOTE — Progress Notes (Signed)
  Radiation Oncology         ([phone]) [phone] ________________________________  Name: Angela Schmidt MRN: [id number]  Date: 04/29/2013  DOB: July 25, 1961  End of Treatment Note  Diagnosis:   Rectal cancer     Indication for treatment:  Curative       Radiation treatment dates:   03/22/2013 through 04/29/2013  Site/dose:   The patient was treated to the pelvis initially using a 4 field 3-D conformal technique. This was treated to 45 gray. The patient then received a cone down 4 field treatment for an additional 5.4 gray. The total dose was 50.4 gray.  Narrative: The patient tolerated radiation treatment relatively well.   The patient did well overall during the treatment. Some rectal/anal irritation towards the end of treatment.  Plan: The patient has completed radiation treatment. The patient will return to radiation oncology clinic for routine followup in one month. I advised the patient to call or return sooner if they have any questions or concerns related to their recovery or treatment. ________________________________  Radene Gunning, M.D., Ph.D.

## 2013-06-03 ENCOUNTER — Encounter (HOSPITAL_COMMUNITY): Payer: Self-pay | Admitting: Pharmacy Technician

## 2013-06-07 ENCOUNTER — Encounter (INDEPENDENT_AMBULATORY_CARE_PROVIDER_SITE_OTHER): Payer: Self-pay

## 2013-06-10 ENCOUNTER — Encounter (HOSPITAL_COMMUNITY)
Admission: RE | Admit: 2013-06-10 | Discharge: 2013-06-10 | Disposition: A | Discharge status: Home / Self Care | Payer: BC Managed Care – PPO | Source: Ambulatory Visit | Attending: General Surgery | Admitting: General Surgery

## 2013-06-10 ENCOUNTER — Inpatient Hospital Stay (HOSPITAL_COMMUNITY): Admission: RE | Admit: 2013-06-10 | Payer: BC Managed Care – PPO | Source: Ambulatory Visit

## 2013-06-10 ENCOUNTER — Encounter (HOSPITAL_COMMUNITY): Payer: Self-pay

## 2013-06-10 DIAGNOSIS — Z01812 Encounter for preprocedural laboratory examination: Secondary | ICD-10-CM | POA: Insufficient documentation

## 2013-06-10 HISTORY — DX: Malignant neoplasm of rectosigmoid junction: C19

## 2013-06-10 HISTORY — DX: Personal history of urinary calculi: Z87.442

## 2013-06-10 LAB — CBC
MCV: 96.4 fL (ref 78.0–100.0)
Platelets: 261 10*3/uL (ref 150–400)
RBC: 4.46 MIL/uL (ref 3.87–5.11)
RDW: 15.2 % (ref 11.5–15.5)
WBC: 4.1 10*3/uL (ref 4.0–10.5)

## 2013-06-10 LAB — BASIC METABOLIC PANEL
CO2: 27 mEq/L (ref 19–32)
Calcium: 10.1 mg/dL (ref 8.4–10.5)
Creatinine, Ser: 0.88 mg/dL (ref 0.50–1.10)
GFR calc Af Amer: 87 mL/min — ABNORMAL LOW (ref >90)
Sodium: 138 mEq/L (ref 135–145)

## 2013-06-10 LAB — HCG, SERUM, QUALITATIVE: Preg, Serum: NEGATIVE

## 2013-06-10 NOTE — Consult Note (Signed)
WOC asked to mark patient preoperatively for diverting ileostomy, however when I arrive to see patient and while reviewing with her the procedure she reports she is having colostomy.  I proceeded to mark her LLQ, however I then verified with surgeon's office that she is having diverting ileostomy and patient just did not realize the difference.  Met with her daughter and patient reviewed the creation of a stoma, pouching options and provided educational DVD and booklet.  Marked pt in the RLQ, to avoid beltline and any abdominal creasing.  She is quite thin so dependent edema is not a significant issue.  Palpated the rectus muscle, staying with the muscle. Evaluated the patient sitting and standing.  Placed mark with surgical marking in the RLQ, 2cm below the umbilicus and 2.5cm to the right of the umbilicus.  Covered with thin film dressing. Explained the role of the WOC nurse.    WOC team will follow up after surgery next week for ostomy care and teaching.  Chaelyn Bunyan [place] RN,CWOCN [phone]

## 2013-06-10 NOTE — Patient Instructions (Addendum)
20 MESHAWN OCONNOR  06/10/2013   Your procedure is scheduled on:10-27   -2014  Report to [facility] at       0530 AM   Call this number if you have problems the morning of surgery: [phone]  Or Presurgical Testing [phone](Roxann Vierra)   Remember: Follow any bowel prep instructions per MD office.   Do not eat food:After Midnight.    Take these medicines the morning of surgery with A SIP OF WATER: Tramadol. Nausea med if needed   Do not wear jewelry, make-up or nail polish.  Do not wear lotions, powders, or perfumes. You may wear deodorant.  Do not shave 12 hours prior to first CHG shower(legs and under arms).(face and neck okay.)  Do not bring valuables to the hospital.  Contacts, dentures or bridgework,body piercing,  may not be worn into surgery.  Leave suitcase in the car. After surgery it may be brought to your room.  For patients admitted to the hospital, checkout time is 11:00 AM the day of discharge.   Patients discharged the day of surgery will not be allowed to drive home. Must have responsible person with you x 24 hours once discharged.  Name and phone number of your driver: Nash Dimmer -daughter [phone] cell  Special Instructions: CHG(Chlorhedine 4%-"Hibiclens","Betasept","Aplicare") Shower Use Special Wash: see special instructions.(avoid face and genitals)   Please read over the following fact sheets that you were given:  Blood Transfusion fact sheet, Incentive Spirometry Instruction.    Failure to follow these instructions may result in Cancellation of your surgery.   Patient signature_______________________________________________________

## 2013-06-10 NOTE — Pre-Procedure Instructions (Signed)
06-10-13 EKG 5'14/ CT Chest 7'14-Epic

## 2013-06-13 ENCOUNTER — Encounter (HOSPITAL_COMMUNITY): Payer: Self-pay | Admitting: Anesthesiology

## 2013-06-13 NOTE — Anesthesia Preprocedure Evaluation (Addendum)
Anesthesia Evaluation  Patient identified by MRN, date of birth, ID band Patient awake    Reviewed: Allergy & Precautions, H&P , NPO status , Patient's Chart, lab work & pertinent test results  Airway Mallampati: II TM Distance: >3 FB Neck ROM: Full    Dental  (+) Edentulous Upper and Edentulous Lower   Pulmonary Current Smoker,  Chest CT: moderate to severe emphysema breath sounds clear to auscultation  Pulmonary exam normal       Cardiovascular Exercise Tolerance: Good negative cardio ROS  Rhythm:Regular Rate:Normal  ECG 12-25-12: negative precordial t waves. Probably normal;   Neuro/Psych negative neurological ROS  negative psych ROS   GI/Hepatic negative GI ROS, Neg liver ROS,   Endo/Other  negative endocrine ROS  Renal/GU negative Renal ROS  negative genitourinary   Musculoskeletal negative musculoskeletal ROS (+)   Abdominal   Peds negative pediatric ROS (+)  Hematology negative hematology ROS (+)   Anesthesia Other Findings   Reproductive/Obstetrics negative OB ROS                          Anesthesia Physical Anesthesia Plan  ASA: III  Anesthesia Plan: General   Post-op Pain Management:    Induction: Intravenous  Airway Management Planned: Oral ETT  Additional Equipment:   Intra-op Plan:   Post-operative Plan: Extubation in OR  Informed Consent: I have reviewed the patients History and Physical, chart, labs and discussed the procedure including the risks, benefits and alternatives for the proposed anesthesia with the patient or authorized representative who has indicated his/her understanding and acceptance.   Dental advisory given  Plan Discussed with: CRNA  Anesthesia Plan Comments:         Anesthesia Quick Evaluation

## 2013-06-14 ENCOUNTER — Encounter (HOSPITAL_COMMUNITY)
Admission: RE | Disposition: A | Discharge status: Home Health | Payer: Self-pay | Source: Ambulatory Visit | Attending: General Surgery

## 2013-06-14 ENCOUNTER — Encounter (HOSPITAL_COMMUNITY): Payer: Self-pay | Admitting: *Deleted

## 2013-06-14 ENCOUNTER — Inpatient Hospital Stay (HOSPITAL_COMMUNITY): Payer: BC Managed Care – PPO | Admitting: Anesthesiology

## 2013-06-14 ENCOUNTER — Encounter (HOSPITAL_COMMUNITY): Payer: BC Managed Care – PPO | Admitting: Anesthesiology

## 2013-06-14 ENCOUNTER — Inpatient Hospital Stay (HOSPITAL_COMMUNITY)
Admission: RE | Admit: 2013-06-14 | Discharge: 2013-06-17 | DRG: 330 | Disposition: A | Discharge status: Home Health | Payer: BC Managed Care – PPO | Source: Ambulatory Visit | Attending: General Surgery | Admitting: General Surgery

## 2013-06-14 DIAGNOSIS — Z01812 Encounter for preprocedural laboratory examination: Secondary | ICD-10-CM

## 2013-06-14 DIAGNOSIS — J438 Other emphysema: Secondary | ICD-10-CM | POA: Diagnosis present

## 2013-06-14 DIAGNOSIS — F17200 Nicotine dependence, unspecified, uncomplicated: Secondary | ICD-10-CM | POA: Diagnosis present

## 2013-06-14 DIAGNOSIS — Z87442 Personal history of urinary calculi: Secondary | ICD-10-CM

## 2013-06-14 DIAGNOSIS — K921 Melena: Secondary | ICD-10-CM | POA: Diagnosis present

## 2013-06-14 DIAGNOSIS — C20 Malignant neoplasm of rectum: Principal | ICD-10-CM | POA: Diagnosis present

## 2013-06-14 DIAGNOSIS — C19 Malignant neoplasm of rectosigmoid junction: Secondary | ICD-10-CM

## 2013-06-14 HISTORY — PX: LAPAROSCOPIC LOW ANTERIOR RESECTION: SHX5904

## 2013-06-14 LAB — TYPE AND SCREEN
ABO/RH(D): O POS
Antibody Screen: NEGATIVE

## 2013-06-14 SURGERY — RESECTION, RECTUM, LOW ANTERIOR, LAPAROSCOPIC
Anesthesia: General | Site: Abdomen | Wound class: Contaminated

## 2013-06-14 MED ORDER — GLYCOPYRROLATE 0.2 MG/ML IJ SOLN
INTRAMUSCULAR | Status: DC | PRN
  Administered 2013-06-14: 0.4 mg via INTRAVENOUS

## 2013-06-14 MED ORDER — DEXTROSE 5 % IV SOLN
2.0000 g | Freq: Two times a day (BID) | INTRAVENOUS | Status: AC
  Administered 2013-06-14: 2 g via INTRAVENOUS
  Filled 2013-06-14: qty 2

## 2013-06-14 MED ORDER — HYDROMORPHONE HCL PF 1 MG/ML IJ SOLN
INTRAMUSCULAR | Status: DC | PRN
  Administered 2013-06-14 (×4): 0.5 mg via INTRAVENOUS

## 2013-06-14 MED ORDER — LACTATED RINGERS IV SOLN
INTRAVENOUS | Status: DC
  Administered 2013-06-14 – 2013-06-15 (×2): via INTRAVENOUS

## 2013-06-14 MED ORDER — LACTATED RINGERS IR SOLN
Status: DC | PRN
  Administered 2013-06-14: 1000 mL

## 2013-06-14 MED ORDER — MORPHINE SULFATE (PF) 1 MG/ML IV SOLN
INTRAVENOUS | Status: DC
  Administered 2013-06-14: 14:00:00 via INTRAVENOUS
  Administered 2013-06-14: 22.07 mg via INTRAVENOUS
  Administered 2013-06-14: 1.5 mg via INTRAVENOUS
  Administered 2013-06-14: 12 mg via INTRAVENOUS
  Administered 2013-06-14: 17:00:00 via INTRAVENOUS
  Administered 2013-06-15: 6 mg via INTRAVENOUS
  Administered 2013-06-15: 5.87 mg via INTRAVENOUS
  Administered 2013-06-15: 7.5 mg via INTRAVENOUS
  Administered 2013-06-15: 03:00:00 via INTRAVENOUS
  Administered 2013-06-15: 10.5 mg via INTRAVENOUS
  Administered 2013-06-15: 28.09 mg via INTRAVENOUS
  Administered 2013-06-16: 10.09 mg via INTRAVENOUS
  Administered 2013-06-16: 3 mg via INTRAVENOUS
  Administered 2013-06-16: 12.2 mg via INTRAVENOUS
  Filled 2013-06-14 (×5): qty 25

## 2013-06-14 MED ORDER — INFLUENZA VAC SPLIT QUAD 0.5 ML IM SUSP
0.5000 mL | INTRAMUSCULAR | Status: AC
  Administered 2013-06-15: 0.5 mL via INTRAMUSCULAR
  Filled 2013-06-14 (×2): qty 0.5

## 2013-06-14 MED ORDER — MIDAZOLAM HCL 5 MG/5ML IJ SOLN
INTRAMUSCULAR | Status: DC | PRN
  Administered 2013-06-14: 2 mg via INTRAVENOUS

## 2013-06-14 MED ORDER — PNEUMOCOCCAL VAC POLYVALENT 25 MCG/0.5ML IJ INJ
0.5000 mL | INJECTION | INTRAMUSCULAR | Status: AC
  Administered 2013-06-15: 0.5 mL via INTRAMUSCULAR
  Filled 2013-06-14 (×2): qty 0.5

## 2013-06-14 MED ORDER — ROCURONIUM BROMIDE 100 MG/10ML IV SOLN
INTRAVENOUS | Status: DC | PRN
  Administered 2013-06-14 (×5): 10 mg via INTRAVENOUS
  Administered 2013-06-14: 50 mg via INTRAVENOUS
  Administered 2013-06-14: 10 mg via INTRAVENOUS

## 2013-06-14 MED ORDER — DEXTROSE 5 % IV SOLN
2.0000 g | INTRAVENOUS | Status: AC
  Administered 2013-06-14: 2 g via INTRAVENOUS
  Filled 2013-06-14: qty 2

## 2013-06-14 MED ORDER — DIPHENHYDRAMINE HCL 12.5 MG/5ML PO ELIX: 12.5000 mg | ORAL_SOLUTION | Freq: Four times a day (QID) | ORAL | Status: DC | PRN

## 2013-06-14 MED ORDER — FENTANYL CITRATE 0.05 MG/ML IJ SOLN
INTRAMUSCULAR | Status: DC | PRN
  Administered 2013-06-14 (×3): 50 ug via INTRAVENOUS
  Administered 2013-06-14: 100 ug via INTRAVENOUS

## 2013-06-14 MED ORDER — LIDOCAINE HCL (CARDIAC) 20 MG/ML IV SOLN
INTRAVENOUS | Status: DC | PRN
  Administered 2013-06-14: 50 mg via INTRAVENOUS

## 2013-06-14 MED ORDER — LACTATED RINGERS IV SOLN
INTRAVENOUS | Status: DC | PRN
  Administered 2013-06-14 (×4): via INTRAVENOUS

## 2013-06-14 MED ORDER — SODIUM CHLORIDE 0.9 % IJ SOLN: 9.0000 mL | INTRAMUSCULAR | Status: DC | PRN

## 2013-06-14 MED ORDER — DIPHENHYDRAMINE HCL 50 MG/ML IJ SOLN: 12.5000 mg | Freq: Four times a day (QID) | INTRAMUSCULAR | Status: DC | PRN

## 2013-06-14 MED ORDER — ONDANSETRON HCL 4 MG/2ML IJ SOLN: 4.0000 mg | Freq: Four times a day (QID) | INTRAMUSCULAR | Status: DC | PRN

## 2013-06-14 MED ORDER — NALOXONE HCL 0.4 MG/ML IJ SOLN: 0.4000 mg | INTRAMUSCULAR | Status: DC | PRN

## 2013-06-14 MED ORDER — BUPIVACAINE-EPINEPHRINE 0.25% -1:200000 IJ SOLN
INTRAMUSCULAR | Status: AC
  Filled 2013-06-14: qty 1

## 2013-06-14 MED ORDER — ENOXAPARIN SODIUM 40 MG/0.4ML ~~LOC~~ SOLN
40.0000 mg | SUBCUTANEOUS | Status: DC
  Administered 2013-06-15 – 2013-06-17 (×3): 40 mg via SUBCUTANEOUS
  Filled 2013-06-14 (×4): qty 0.4

## 2013-06-14 MED ORDER — PROMETHAZINE HCL 25 MG/ML IJ SOLN
INTRAMUSCULAR | Status: AC
  Filled 2013-06-14: qty 1

## 2013-06-14 MED ORDER — BUPIVACAINE-EPINEPHRINE 0.25% -1:200000 IJ SOLN
INTRAMUSCULAR | Status: DC | PRN
  Administered 2013-06-14: 15 mL

## 2013-06-14 MED ORDER — EPHEDRINE SULFATE 50 MG/ML IJ SOLN
INTRAMUSCULAR | Status: DC | PRN
  Administered 2013-06-14 (×3): 5 mg via INTRAVENOUS

## 2013-06-14 MED ORDER — ONDANSETRON HCL 4 MG/2ML IJ SOLN
INTRAMUSCULAR | Status: DC | PRN
  Administered 2013-06-14: 4 mg via INTRAVENOUS

## 2013-06-14 MED ORDER — ALVIMOPAN 12 MG PO CAPS
12.0000 mg | ORAL_CAPSULE | Freq: Once | ORAL | Status: AC
  Administered 2013-06-14: 12 mg via ORAL
  Filled 2013-06-14: qty 1

## 2013-06-14 MED ORDER — PROPOFOL 10 MG/ML IV BOLUS
INTRAVENOUS | Status: DC | PRN
  Administered 2013-06-14: 160 mg via INTRAVENOUS

## 2013-06-14 MED ORDER — ALVIMOPAN 12 MG PO CAPS
12.0000 mg | ORAL_CAPSULE | Freq: Two times a day (BID) | ORAL | Status: DC
  Administered 2013-06-15 (×2): 12 mg via ORAL
  Filled 2013-06-14 (×4): qty 1

## 2013-06-14 MED ORDER — HEPARIN SODIUM (PORCINE) 5000 UNIT/ML IJ SOLN
5000.0000 [IU] | Freq: Once | INTRAMUSCULAR | Status: AC
  Administered 2013-06-14: 5000 [IU] via SUBCUTANEOUS
  Filled 2013-06-14: qty 1

## 2013-06-14 MED ORDER — HYDROMORPHONE HCL PF 1 MG/ML IJ SOLN
0.2500 mg | INTRAMUSCULAR | Status: DC | PRN
  Administered 2013-06-14 (×2): 0.5 mg via INTRAVENOUS

## 2013-06-14 MED ORDER — CEFOTETAN DISODIUM-DEXTROSE 2-2.08 GM-% IV SOLR
INTRAVENOUS | Status: AC
  Filled 2013-06-14: qty 50

## 2013-06-14 MED ORDER — MORPHINE SULFATE (PF) 1 MG/ML IV SOLN
INTRAVENOUS | Status: AC
  Filled 2013-06-14: qty 25

## 2013-06-14 MED ORDER — NEOSTIGMINE METHYLSULFATE 1 MG/ML IJ SOLN
INTRAMUSCULAR | Status: DC | PRN
  Administered 2013-06-14: 3 mg via INTRAVENOUS

## 2013-06-14 MED ORDER — NICOTINE 21 MG/24HR TD PT24
21.0000 mg | MEDICATED_PATCH | TRANSDERMAL | Status: DC
  Administered 2013-06-14: 21 mg via TRANSDERMAL
  Filled 2013-06-14 (×2): qty 1

## 2013-06-14 MED ORDER — HYDROMORPHONE HCL PF 1 MG/ML IJ SOLN
INTRAMUSCULAR | Status: AC
  Filled 2013-06-14: qty 1

## 2013-06-14 MED ORDER — PROMETHAZINE HCL 25 MG/ML IJ SOLN
6.2500 mg | INTRAMUSCULAR | Status: DC | PRN
  Administered 2013-06-14: 12.5 mg via INTRAVENOUS

## 2013-06-14 MED ORDER — 0.9 % SODIUM CHLORIDE (POUR BTL) OPTIME
TOPICAL | Status: DC | PRN
  Administered 2013-06-14: 3000 mL

## 2013-06-14 SURGICAL SUPPLY — 96 items
ADH SKN CLS APL DERMABOND .7 (GAUZE/BANDAGES/DRESSINGS) ×1
APPLIER CLIP 5 13 M/L LIGAMAX5 (MISCELLANEOUS)
APR CLP MED LRG 5 ANG JAW (MISCELLANEOUS)
BAG UROSTOMY 4 LOOP 90 STRL (OSTOMY) ×1 IMPLANT
BLADE EXTENDED COATED 6.5IN (ELECTRODE) ×3 IMPLANT
BLADE HEX COATED 2.75 (ELECTRODE) ×4 IMPLANT
BLADE SURG SZ10 CARB STEEL (BLADE) ×3 IMPLANT
BRIEF STRETCH FOR OB PAD LRG (UNDERPADS AND DIAPERS) ×1 IMPLANT
CABLE HIGH FREQUENCY MONO STRZ (ELECTRODE) ×2 IMPLANT
CATH ROBINSON RED A/P 16FR (CATHETERS) ×1 IMPLANT
CELLS DAT CNTRL 66122 CELL SVR (MISCELLANEOUS) ×1 IMPLANT
CHLORAPREP W/TINT 26ML (MISCELLANEOUS) ×2 IMPLANT
CLIP APPLIE 5 13 M/L LIGAMAX5 (MISCELLANEOUS) IMPLANT
COUNTER NEEDLE 20 DBL MAG RED (NEEDLE) ×2 IMPLANT
COVER MAYO STAND STRL (DRAPES) ×4 IMPLANT
DECANTER SPIKE VIAL GLASS SM (MISCELLANEOUS) ×2 IMPLANT
DERMABOND ADVANCED (GAUZE/BANDAGES/DRESSINGS) ×1
DERMABOND ADVANCED .7 DNX12 (GAUZE/BANDAGES/DRESSINGS) IMPLANT
DRAIN CHANNEL 19F RND (DRAIN) ×1 IMPLANT
DRAPE LAPAROSCOPIC ABDOMINAL (DRAPES) ×2 IMPLANT
DRAPE LG THREE QUARTER DISP (DRAPES) ×2 IMPLANT
DRAPE TABLE BACK 44X90 PK DISP (DRAPES) ×1 IMPLANT
DRAPE UTILITY XL STRL (DRAPES) ×4 IMPLANT
DRAPE WARM FLUID 44X44 (DRAPE) ×2 IMPLANT
DRSG OPSITE POSTOP 4X10 (GAUZE/BANDAGES/DRESSINGS) IMPLANT
DRSG OPSITE POSTOP 4X6 (GAUZE/BANDAGES/DRESSINGS) IMPLANT
DRSG OPSITE POSTOP 4X8 (GAUZE/BANDAGES/DRESSINGS) IMPLANT
ELECT REM PT RETURN 9FT ADLT (ELECTROSURGICAL) ×2
ELECTRODE REM PT RTRN 9FT ADLT (ELECTROSURGICAL) ×1 IMPLANT
EVACUATOR SILICONE 100CC (DRAIN) IMPLANT
GLOVE BIO SURGEON STRL SZ 6.5 (GLOVE) ×6 IMPLANT
GLOVE BIOGEL PI IND STRL 7.0 (GLOVE) ×2 IMPLANT
GLOVE BIOGEL PI IND STRL 7.5 (GLOVE) IMPLANT
GLOVE BIOGEL PI IND STRL 8 (GLOVE) IMPLANT
GLOVE BIOGEL PI INDICATOR 7.0 (GLOVE) ×4
GLOVE BIOGEL PI INDICATOR 7.5 (GLOVE) ×5
GLOVE BIOGEL PI INDICATOR 8 (GLOVE) ×2
GLOVE ECLIPSE 8.0 STRL XLNG CF (GLOVE) ×2 IMPLANT
GLOVE EUDERMIC 7 POWDERFREE (GLOVE) ×2 IMPLANT
GLOVE SURG SS PI 7.0 STRL IVOR (GLOVE) ×5 IMPLANT
GLOVE SURG SS PI 7.5 STRL IVOR (GLOVE) ×3 IMPLANT
GOWN BRE IMP PREV XXLGXLNG (GOWN DISPOSABLE) ×5 IMPLANT
GOWN STRL NON-REIN LRG LVL3 (GOWN DISPOSABLE) ×5 IMPLANT
GOWN STRL REIN XL XLG (GOWN DISPOSABLE) ×8 IMPLANT
HANDLE STAPLE EGIA 4 XL (STAPLE) ×1 IMPLANT
KIT BASIN OR (CUSTOM PROCEDURE TRAY) ×2 IMPLANT
LEGGING LITHOTOMY PAIR STRL (DRAPES) ×2 IMPLANT
LIGASURE IMPACT 36 18CM CVD LR (INSTRUMENTS) IMPLANT
PENCIL BUTTON HOLSTER BLD 10FT (ELECTRODE) ×5 IMPLANT
RELOAD EGIA 45 MED/THCK PURPLE (STAPLE) ×2 IMPLANT
RELOAD EGIA 60 MED/THCK PURPLE (STAPLE) ×4 IMPLANT
RELOAD STAPLE 60 MED/THCK ART (STAPLE) IMPLANT
RETRACTOR LONE STAR DISPOSABLE (INSTRUMENTS) ×1 IMPLANT
RETRACTOR STAY HOOK 5MM (MISCELLANEOUS) ×1 IMPLANT
RETRACTOR WND ALEXIS 18 MED (MISCELLANEOUS) IMPLANT
RTRCTR WOUND ALEXIS 18CM MED (MISCELLANEOUS) ×2
SCISSORS LAP 5X35 DISP (ENDOMECHANICALS) ×2 IMPLANT
SEALER TISSUE G2 CVD JAW 35 (ENDOMECHANICALS) IMPLANT
SEALER TISSUE G2 CVD JAW 45CM (ENDOMECHANICALS)
SEALER TISSUE G2 STRG ARTC 35C (ENDOMECHANICALS) ×1 IMPLANT
SET IRRIG TUBING LAPAROSCOPIC (IRRIGATION / IRRIGATOR) ×2 IMPLANT
SLEEVE SURGEON STRL (DRAPES) ×2 IMPLANT
SLEEVE XCEL OPT CAN 5 100 (ENDOMECHANICALS) ×4 IMPLANT
SOLUTION ANTI FOG 6CC (MISCELLANEOUS) ×2 IMPLANT
SPONGE GAUZE 4X4 12PLY (GAUZE/BANDAGES/DRESSINGS) ×2 IMPLANT
SPONGE LAP 18X18 X RAY DECT (DISPOSABLE) ×4 IMPLANT
STAPLER VISISTAT 35W (STAPLE) ×1 IMPLANT
SUCTION POOLE TIP (SUCTIONS) ×2 IMPLANT
SUT ETHILON 2 0 PS N (SUTURE) IMPLANT
SUT ETHILON 3 0 PS 1 (SUTURE) ×1 IMPLANT
SUT NOVA NAB DX-16 0-1 5-0 T12 (SUTURE) IMPLANT
SUT PDS AB 1 CTX 36 (SUTURE) IMPLANT
SUT PDS AB 1 TP1 96 (SUTURE) IMPLANT
SUT PROLENE 2 0 KS (SUTURE) IMPLANT
SUT SILK 2 0 (SUTURE) ×2
SUT SILK 2 0 SH CR/8 (SUTURE) ×2 IMPLANT
SUT SILK 2-0 18XBRD TIE 12 (SUTURE) ×1 IMPLANT
SUT SILK 3 0 (SUTURE) ×2
SUT SILK 3 0 SH CR/8 (SUTURE) ×2 IMPLANT
SUT SILK 3-0 18XBRD TIE 12 (SUTURE) ×1 IMPLANT
SUT VIC AB 2-0 SH 18 (SUTURE) ×2 IMPLANT
SUT VIC AB 4-0 PS2 27 (SUTURE) ×1 IMPLANT
SUT VICRYL 0 UR6 27IN ABS (SUTURE) ×2 IMPLANT
SUT VICRYL 2 0 18  UND BR (SUTURE)
SUT VICRYL 2 0 18 UND BR (SUTURE) IMPLANT
SYR BULB IRRIGATION 50ML (SYRINGE) ×3 IMPLANT
SYS LAPSCP GELPORT 120MM (MISCELLANEOUS)
SYSTEM LAPSCP GELPORT 120MM (MISCELLANEOUS) IMPLANT
TOWEL OR 17X26 10 PK STRL BLUE (TOWEL DISPOSABLE) ×4 IMPLANT
TOWEL OR NON WOVEN STRL DISP B (DISPOSABLE) ×4 IMPLANT
TRAY FOLEY CATH 14FRSI W/METER (CATHETERS) ×2 IMPLANT
TRAY LAP CHOLE (CUSTOM PROCEDURE TRAY) ×2 IMPLANT
TROCAR BLADELESS OPT 5 100 (ENDOMECHANICALS) ×2 IMPLANT
TROCAR XCEL BLUNT TIP 100MML (ENDOMECHANICALS) ×2 IMPLANT
TUBING FILTER THERMOFLATOR (ELECTROSURGICAL) ×2 IMPLANT
YANKAUER SUCT BULB TIP 10FT TU (MISCELLANEOUS) ×5 IMPLANT

## 2013-06-14 NOTE — Op Note (Signed)
06/14/2013  1:47 PM  PATIENT:  Angela Schmidt  52 y.o. female  Patient Care Team: Sheliah Hatch, MD as PCP - General (Family Medicine) Ladene Artist, MD as Consulting Physician (Oncology) Cira Rue, RN as Registered Nurse  PRE-OPERATIVE DIAGNOSIS:  rectal cancer  POST-OPERATIVE DIAGNOSIS:  rectal cancer  PROCEDURE:  LAPAROSCOPIC LOW ANTERIOR RESECTION AND DIVERTING ILEOSTOMY WITH HAND SEWN COLOANAL ANASTOMOSIS  TAKEDOWN OF SPLENIC FLEXURE   SURGEON:  Surgeon(s): Romie Levee, MD  ASSISTANT: Biagio Quint   ANESTHESIA:   general  EBL: Total I/O In: 4500 [I.V.:4500] Out: 450 [Urine:300; Blood:150]  DRAINS: (11F) Jackson-Pratt drain(s) with closed bulb suction in the pelvis   SPECIMEN:  Source of Specimen:  rectum, additional distal margin  DISPOSITION OF SPECIMEN:  PATHOLOGY  COUNTS:  YES  PLAN OF CARE: Admit to inpatient   PATIENT DISPOSITION:  PACU - hemodynamically stable.  INDICATION: this is a 51 year old female who was found to have a uT3 N1 rectal cancer on initial exam. She underwent neoadjuvant chemoradiation and now she is here today for resection.  The risk and benefits of the surgery were explained to the patient and consent was signed and placed on chart. She was also marked preoperatively by the ostomy nurse for a diverting loop ileostomy.   OR FINDINGS: very low distal rectal cancer which appeared to have good response to neoadjuvant treatment.  No other signs of metastatic disease.  DESCRIPTION: the patient was identified in the preoperative holding area and taken to the OR where she was laid supine on the operating room table. General anesthesia was smoothly induced. A Foley catheter was inserted under sterile conditions she was placed in lithotomy position. She was then prepped and draped in the usual sterile fashion. A surgical timeout was performed indicating the correct patient, procedure, positioning and preoperative antibiotics. SCDs  were in place by initiation of anesthesia. The patient also received subcutaneous heparin in the preoperative area.   After this was completed infraumbilical incision was made using a 15 blade scalpel through the patient's previous scar. The incision was carried down to the fascia. The fascia of the using Hibiclens. The fascia was incised the midline using a 15 blade scalpel. The peritoneum was then using 2 Kelly clamps and incised using Metzenbaum scissors. A Vicryl stay suture was placed in a pursestring fashion through the fascia.  A Hassan port was placed into the abdomen and the abdomen was insufflated to approximately 15 mm of mercury. A 10 mm 30 camera was then placed through the [facility] port and the abdomen was evaluated.  There were no signs of metastatic disease. The liver appeared normal.  The sigmoid colon was adherent to the left ovary.  I placed a 5 mm port in the right lower quadrant and left lower quadrants under direct visualization. Another 5 mm port was placed in the right upper quadrant as well.  I began by dissecting the sigmoid artery and vein approximately 2 cm distal to the takeoff from the aorta. I dissected under this in a medial to lateral fashion. Identifiably coursing over the left iliac artery. This was dissected free and cut down into the peritoneum. The sigmoid artery and vein were skeletonized and transected using the Enseal device. After this was completed the sigmoid colon could be mobilized to the pelvis. I mobilized the rest of the left colon from the left side using blunt dissection and the Enseal device.  I mobilized the splenic flexure. In intermittent into the pelvis.  And identified the presacral plane and mobilized the rectum posteriorly. I then transected the remaining portion of the peritoneum down to the sidewalls of the rectum making sure to preserve the mesorectal plane. I dissected down posteriorly to the coccyx. I then dissected laterally in the mesorectal plane  until it got to the peritoneal reflection. I then divided the anterior peritoneal reflection and dissected the rectum off from the vagina using blunt mobilization. After this was completed and we were down to the level of the levators in all 4 planes, I palpated the tumor transanally. I decided to tried to staple laparoscopically. I brought a curvilinear universal laparoscopic stapler into the field. A purple 60 mm staple load was used to transect the distal rectum at the peritoneal floor. I used lower 45 mm staple loads to complete the transection.  After this was completed of the entire distal rectum was mobilized I turned my attention proximally. The remaining sigmoid colon mesentery was transected using the Enseal device. I identified a portion on the sigmoid colon for transection.  I then removed my right lower quadrant port in the defect in the skin over the previously marked ostomy site. This was carried down through the subcutaneous tissues using Bovie electrocautery. The fascia was then identified and incised in a cruciate manner. The rectus muscle was split and the peritoneum was entered bluntly. A wound protector was placed and the distal end of the colon was brought out. I transected the sigmoid colon at the previously marked site in the ascending laparoscopic stapler. This was then placed back into the abdomen and the specimen was sent to pathology for further examination. I did transect the specimen at a later time and found that the site of the cancer was right at the staple line. I decided that I would need to resect the remaining portion of the rectum and performed a coloanal anastomosis.  To do this, I would first need to mobilize the splenic flexure.  The patient was placed head up and I began to transect the peritoneal attachments at the splenic flexure using the Enseal device.  I then mobilized medially by taking down the omentum from the transverse colon. I did this until I met the dissection  from the lateral sidewall. Once the splenic flexure was entirely mobilized the end of the sigmoid just reached into the pelvis. I took all remaining attachments medially to the level of the IMV.  I then identified the terminal ileum. I made a small defect in the mesentery and placed it over a catheter through this to create a loop ileostomy. This was then brought up through the ostomy incision. It was secured into place temporarily with a penetrating towel clamp. After this was completed, I placed a laparoscopic retractor on the staple line of the distal colon and placed this into the pelvis. The abdomen was then desufflated and I then placed a [place] device around the anal canal. The rectal mucosa was then transected just proximal to the dentate line using Bovie electrocautery. I dissected posteriorly along the coccyx until I reached the previous dissection in the pelvis internally. I then bluntly dissected the remaining portion of the distal rectum. I then freed the remaining portion of the distal rectum from the vaginal wall using careful blunt dissection and Bovie electrocautery. Once the entire specimen was free it was sent to pathology as further distal margin. The staple line marks the proximal portion of this margin. After this was completed the colon was then  brought down to the anal canal. It coloanal anastomosis was created using 2-0 interrupted Vicryl sutures.  Once this was completed then the [facility] device was removed and the anastomosis was placed back into the pelvis. Independently on some tension when cutting the anastomosis but once the device was removed there did not appear to be any tension on the sutures were anastomosis. At this point I then reinsufflated the abdomen. A drain was placed into the pelvis and brought out through the right lower quadrant 5 mm port site incision. This was incision into place using a 2-0 Vicryl suture. I expect the colon once again it appeared to be lying in  the proper position and did not appear to be under undue tension. I then evaluated the loop ileostomy.  There was no twisting. The distal limb was oriented caudally.  The abdomen was then desufflated. The remaining ports were removed.  I placed 2 additional sutures in the fascia of the umbilical incision to assist in closure.  I also tied my previous stay suture.  The umbilical wound was then irrigated with normal saline. The skin was then closed using a running 4-0 Vicryl subcuticular suture. The left lower quadrant port site was also closed using a 4-0 Vicryl subcuticular suture. Dermabond was used on the incisions. The fascial defect was wider than I intended.  I placed 2 interrupted 0 Vicryl sutures to close this space down. After this was completed,  I then matured the ostomy in standard [place] fashion.  An ostomy appliance was then placed. The patient was then awakened from anesthesia and sent to the post anesthesia care unit in stable condition. All counts were correct per operating room staff.

## 2013-06-14 NOTE — Progress Notes (Signed)
Chief Complaint   Patient presents with   .  New Evaluation     eval rectal mass   HISTORY:  Angela Schmidt is a 52 y.o. female who presents to the office with a rectal cancer. This was found on colnoscopy. Biopsy showed adenocarcinoma. Workup thus far has included CEA, CT chest, abd and pelvis, and rectal [place]. She has had some vague changes in stool and occasional rectal bleeding. She denies abd pain, nausea or weight loss.  She has completed neoadjuvant chemoRT.   Past Medical History   Diagnosis  Date   .  Arthritis    .  Chicken pox    .  Kidney stone    .  Cancer     Past Surgical History   Procedure  Laterality  Date   .  Tubal ligation     .  Bunionectomy   1984-ish   .  Eus  N/A  02/25/2013     Procedure: LOWER ENDOSCOPIC ULTRASOUND (EUS); Surgeon: Rachael Fee, MD; Location: [facility]; Service: Endoscopy; Laterality: N/A; radial    Current Outpatient Prescriptions   Medication  Sig  Dispense  Refill   .  traMADol (ULTRAM) 50 MG tablet  Take 50 mg by mouth every 6 (six) hours as needed for pain. Takes 1/2 -1 tab daily.      No current facility-administered medications for this visit.   No Known Allergies  Family History   Problem  Relation  Age of Onset   .  Arthritis  Mother    .  Diabetes  Mother    .  Thyroid disease  Mother    .  Arthritis  Father    .  Diabetes  Father    .  Parkinson's disease  Father    .  Colon cancer  Neg Hx     History    Social History   .  Marital Status:  Divorced     Spouse Name:  N/A     Number of Children:  N/A   .  Years of Education:  N/A    Social History Main Topics   .  Smoking status:  Current Every Day Smoker -- 1.00 packs/day for 30 years     Types:  Cigarettes   .  Smokeless tobacco:  Never Used   .  Alcohol Use:  No   .  Drug Use:  No   .  Sexually Active:  None    Other Topics  Concern   .  None    Social History Narrative   .  None   REVIEW OF SYSTEMS - PERTINENT POSITIVES ONLY:  Review of Systems -  General ROS: negative for - chills, fever or weight loss  Hematological and Lymphatic ROS: negative for - bleeding problems, blood clots or bruising  Respiratory ROS: no cough, shortness of breath, or wheezing  Cardiovascular ROS: no chest pain or dyspnea on exertion  Gastrointestinal ROS: positive for - blood in stools  negative for - abdominal pain or change in bowel habits  Genito-Urinary ROS: no dysuria, trouble voiding, or hematuria  EXAM:  Filed Vitals:   06/14/13 0523  BP: 110/66  Pulse: 86  Temp: 97.7 F (36.5 C)  Resp: 18   Gen: No acute distress. Well nourished and well groomed.  Neurological: Alert and oriented to person, place, and time. Coordination normal.  Head: Normocephalic and atraumatic.  Eyes: Conjunctivae are normal. Pupils are equal, round, and reactive to light. No  scleral icterus.  Neck: Normal range of motion. Neck supple. No tracheal deviation or thyromegaly present. No cervical lymphadenopathy.  Cardiovascular: Normal rate, regular rhythm, normal heart sounds and intact distal pulses. Exam reveals no gallop and no friction rub. No murmur heard.  Respiratory: Effort normal. No respiratory distress. No chest wall tenderness. Breath sounds normal. No wheezes, rales or rhonchi.  GI: Soft. Bowel sounds are normal. The abdomen is soft and nontender. There is no rebound and no guarding.  Musculoskeletal: Normal range of motion. Extremities are nontender.  Skin: Skin is warm and dry. No rash noted. No diaphoresis. No erythema. No pallor. No clubbing, cyanosis, or edema.  Psychiatric: Normal mood and affect. Behavior is normal. Judgment and thought content normal.  Anorectal Exam: mass palpated at tip of finger ~6-8 cm from anal verge.  LABORATORY RESULTS:  Available labs are reviewed  CEA: 13.6  RADIOLOGY RESULTS:  Images and reports are reviewed.  CT CHEST IMPRESSION:  1. No acute process or evidence of metastatic disease in the chest.  2. Moderate to severe  centrilobular emphysema.  3. Subtle nodularity within the trachea and bronchi. Possibly related to secretions. Recommend attention on follow-up to exclude tracheal polyps.  CT ABD/PELVIS IMPRESSION:  1. Anorectal wall thickening which likely represents the primary. Adjacent adenopathy in the mesorectum, highly suspicious for localized nodal disease. Cannot exclude direct involvement of the circumferential resection margin. This would be better evaluated with MRI.  2. No evidence of distant metastasis within the abdomen or pelvis.  3. Probable complex left ovarian dominant follicle/cyst.  4. Mild left renal atrophy.  ASSESSMENT AND PLAN:  Angela Schmidt is a 52 y.o. F with recently diagnosed rectal cancer. This was a T3 N1 on rectal [place]. She is scheduled to see radiation and oncology for neoadjuvant treatment. I think she would be a good surgical candidate and will require an LAR and diverting loop ileostomy.  She has been marked for this preoperatively.   The surgery and anatomy were described to the patient as well as the risks of surgery and the possible complications. These include: Bleeding, infection and possible wound complications such as hernia, damage to adjacent structures, leak of surgical connections, which can lead to other surgeries and possibly an ostomy (5-7%), possible need for other procedures, such as abscess drains in radiology, possible prolonged hospital stay, possible diarrhea from removal of part of the colon, possible constipation from narcotics, prolonged fatigue/weakness or appetite loss, possible early recurrence of cancer, possible complications of their medical problems such as heart disease or arrhythmias or lung problems, death (less than 1%).  I believe the patient understands and wishes to proceed with the surgery.

## 2013-06-14 NOTE — Transfer of Care (Signed)
Immediate Anesthesia Transfer of Care Note  Patient: Angela Schmidt  Procedure(s) Performed: Procedure(s) with comments: LAPAROSCOPIC LOW ANTERIOR RESECTION AND DIVERTING LAPAROSCOPIC ILEOSTOMY (N/A) - with splenic flexure takdown  Patient Location: PACU  Anesthesia Type:General  Level of Consciousness: awake, alert  and oriented  Airway & Oxygen Therapy: Patient Spontanous Breathing and Patient connected to face mask oxygen  Post-op Assessment: Report given to PACU RN and Post -op Vital signs reviewed and stable  Post vital signs: Reviewed and stable  Complications: No apparent anesthesia complications

## 2013-06-14 NOTE — Anesthesia Postprocedure Evaluation (Signed)
  Anesthesia Post-op Note  Patient: Angela Schmidt  Procedure(s) Performed: Procedure(s) (LRB): LAPAROSCOPIC LOW ANTERIOR RESECTION AND DIVERTING LAPAROSCOPIC ILEOSTOMY (N/A)  Patient Location: PACU  Anesthesia Type: General  Level of Consciousness: awake and alert   Airway and Oxygen Therapy: Patient Spontanous Breathing  Post-op Pain: mild  Post-op Assessment: Post-op Vital signs reviewed, Patient's Cardiovascular Status Stable, Respiratory Function Stable, Patent Airway and No signs of Nausea or vomiting  Last Vitals:  Filed Vitals:   06/14/13 1551  BP:   Pulse:   Temp:   Resp: 12    Post-op Vital Signs: stable   Complications: No apparent anesthesia complications

## 2013-06-14 NOTE — H&P (Signed)
Chief Complaint   Patient presents with   .  New Evaluation     eval rectal mass   HISTORY:  Angela Schmidt is a 52 y.o. female who presents to the office with a rectal cancer. This was found on colnoscopy. Biopsy showed adenocarcinoma. Workup thus far has included CEA, CT chest, abd and pelvis, and rectal US. She has had some vague changes in stool and occasional rectal bleeding. She denies abd pain, nausea or weight loss.  She has completed neoadjuvant chemoRT.   Past Medical History   Diagnosis  Date   .  Arthritis    .  Chicken pox    .  Kidney stone    .  Cancer     Past Surgical History   Procedure  Laterality  Date   .  Tubal ligation     .  Bunionectomy   1984-ish   .  Eus  N/A  02/25/2013     Procedure: LOWER ENDOSCOPIC ULTRASOUND (EUS); Surgeon: Daniel P Jacobs, MD; Location: [facility]; Service: Endoscopy; Laterality: N/A; radial    Current Outpatient Prescriptions   Medication  Sig  Dispense  Refill   .  traMADol (ULTRAM) 50 MG tablet  Take 50 mg by mouth every 6 (six) hours as needed for pain. Takes 1/2 -1 tab daily.      No current facility-administered medications for this visit.   No Known Allergies  Family History   Problem  Relation  Age of Onset   .  Arthritis  Mother    .  Diabetes  Mother    .  Thyroid disease  Mother    .  Arthritis  Father    .  Diabetes  Father    .  Parkinson's disease  Father    .  Colon cancer  Neg Hx     History    Social History   .  Marital Status:  Divorced     Spouse Name:  N/A     Number of Children:  N/A   .  Years of Education:  N/A    Social History Main Topics   .  Smoking status:  Current Every Day Smoker -- 1.00 packs/day for 30 years     Types:  Cigarettes   .  Smokeless tobacco:  Never Used   .  Alcohol Use:  No   .  Drug Use:  No   .  Sexually Active:  None    Other Topics  Concern   .  None    Social History Narrative   .  None   REVIEW OF SYSTEMS - PERTINENT POSITIVES ONLY:  Review of Systems -  General ROS: negative for - chills, fever or weight loss  Hematological and Lymphatic ROS: negative for - bleeding problems, blood clots or bruising  Respiratory ROS: no cough, shortness of breath, or wheezing  Cardiovascular ROS: no chest pain or dyspnea on exertion  Gastrointestinal ROS: positive for - blood in stools  negative for - abdominal pain or change in bowel habits  Genito-Urinary ROS: no dysuria, trouble voiding, or hematuria  EXAM:  Filed Vitals:   06/14/13 0523  BP: 110/66  Pulse: 86  Temp: 97.7 F (36.5 C)  Resp: 18   Gen: No acute distress. Well nourished and well groomed.  Neurological: Alert and oriented to person, place, and time. Coordination normal.  Head: Normocephalic and atraumatic.  Eyes: Conjunctivae are normal. Pupils are equal, round, and reactive to light. No   scleral icterus.  Neck: Normal range of motion. Neck supple. No tracheal deviation or thyromegaly present. No cervical lymphadenopathy.  Cardiovascular: Normal rate, regular rhythm, normal heart sounds and intact distal pulses. Exam reveals no gallop and no friction rub. No murmur heard.  Respiratory: Effort normal. No respiratory distress. No chest wall tenderness. Breath sounds normal. No wheezes, rales or rhonchi.  GI: Soft. Bowel sounds are normal. The abdomen is soft and nontender. There is no rebound and no guarding.  Musculoskeletal: Normal range of motion. Extremities are nontender.  Skin: Skin is warm and dry. No rash noted. No diaphoresis. No erythema. No pallor. No clubbing, cyanosis, or edema.  Psychiatric: Normal mood and affect. Behavior is normal. Judgment and thought content normal.  Anorectal Exam: mass palpated at tip of finger ~6-8 cm from anal verge.  LABORATORY RESULTS:  Available labs are reviewed  CEA: 13.6  RADIOLOGY RESULTS:  Images and reports are reviewed.  CT CHEST IMPRESSION:  1. No acute process or evidence of metastatic disease in the chest.  2. Moderate to severe  centrilobular emphysema.  3. Subtle nodularity within the trachea and bronchi. Possibly related to secretions. Recommend attention on follow-up to exclude tracheal polyps.  CT ABD/PELVIS IMPRESSION:  1. Anorectal wall thickening which likely represents the primary. Adjacent adenopathy in the mesorectum, highly suspicious for localized nodal disease. Cannot exclude direct involvement of the circumferential resection margin. This would be better evaluated with MRI.  2. No evidence of distant metastasis within the abdomen or pelvis.  3. Probable complex left ovarian dominant follicle/cyst.  4. Mild left renal atrophy.  ASSESSMENT AND PLAN:  Angela Schmidt is a 52 y.o. F with recently diagnosed rectal cancer. This was a T3 N1 on rectal US. She is scheduled to see radiation and oncology for neoadjuvant treatment. I think she would be a good surgical candidate and will require an LAR and diverting loop ileostomy.  She has been marked for this preoperatively.   The surgery and anatomy were described to the patient as well as the risks of surgery and the possible complications. These include: Bleeding, infection and possible wound complications such as hernia, damage to adjacent structures, leak of surgical connections, which can lead to other surgeries and possibly an ostomy (5-7%), possible need for other procedures, such as abscess drains in radiology, possible prolonged hospital stay, possible diarrhea from removal of part of the colon, possible constipation from narcotics, prolonged fatigue/weakness or appetite loss, possible early recurrence of cancer, possible complications of their medical problems such as heart disease or arrhythmias or lung problems, death (less than 1%).  I believe the patient understands and wishes to proceed with the surgery.    

## 2013-06-15 ENCOUNTER — Encounter (HOSPITAL_COMMUNITY): Payer: Self-pay | Admitting: General Surgery

## 2013-06-15 LAB — CBC
HCT: 34 % — ABNORMAL LOW (ref 36.0–46.0)
Hemoglobin: 11.8 g/dL — ABNORMAL LOW (ref 12.0–15.0)
MCH: 33.4 pg (ref 26.0–34.0)
MCHC: 34.7 g/dL (ref 30.0–36.0)
MCV: 96.3 fL (ref 78.0–100.0)
RDW: 15.3 % (ref 11.5–15.5)
WBC: 8.4 10*3/uL (ref 4.0–10.5)

## 2013-06-15 LAB — PHOSPHORUS: Phosphorus: 4.3 mg/dL (ref 2.3–4.6)

## 2013-06-15 LAB — BASIC METABOLIC PANEL
BUN: 8 mg/dL (ref 6–23)
CO2: 29 mEq/L (ref 19–32)
Chloride: 99 mEq/L (ref 96–112)
Creatinine, Ser: 0.9 mg/dL (ref 0.50–1.10)
GFR calc Af Amer: 84 mL/min — ABNORMAL LOW (ref >90)
Glucose, Bld: 99 mg/dL (ref 70–99)
Potassium: 4.1 mEq/L (ref 3.5–5.1)

## 2013-06-15 MED ORDER — KCL IN DEXTROSE-NACL 20-5-0.45 MEQ/L-%-% IV SOLN
INTRAVENOUS | Status: DC
  Administered 2013-06-15 – 2013-06-16 (×2): via INTRAVENOUS
  Filled 2013-06-15 (×2): qty 1000

## 2013-06-15 MED ORDER — MAGNESIUM SULFATE 40 MG/ML IJ SOLN
4.0000 g | Freq: Once | INTRAMUSCULAR | Status: AC
  Administered 2013-06-15: 4 g via INTRAVENOUS
  Filled 2013-06-15: qty 100

## 2013-06-15 MED ORDER — NICOTINE 21 MG/24HR TD PT24
21.0000 mg | MEDICATED_PATCH | TRANSDERMAL | Status: DC
  Administered 2013-06-15 – 2013-06-17 (×3): 21 mg via TRANSDERMAL
  Filled 2013-06-15 (×3): qty 1

## 2013-06-15 NOTE — Consult Note (Signed)
[facility] ostomy consult note Stoma type/location: RLQ loop ileostomy with red rubber catheter "loop bridge in place Stomal assessment/size: between 1 and 3/8 and 1 and 5/8 inches round.  Functioning (proximal) limb is above distal limb. Stoma is pink, moist and edematous. Peristomal assessment: intact, clear Treatment options for stomal/peristomal skin: Non indicated.  I have used a skin barrier ring to give her some added protection as the edema resolves. Output small amount of thin brown effluent in pouch. Ostomy pouching: 2pc. Pouching system with skin barrier ring selected, 2 and 1/4 inch size. Education provided: Patient and daughter Jana Half) in room for teaching session.  Stoma and pouch characteristics reviewed.  Basic A and P taught with good understanding. Basic need for fluid replacement taught, but patient a little overwhelmed this visit.  Patient reluctant to view stoma, encouraged and received strength from daughter to take a quick "peek".  Supplies ordered and RN to place in room when they arrive.  Teaching to continue tomorrow and over next few days.  I will recommend that [facility] be ordered to supplement information provided here in hospital and to off-load her current support system (her two daughters). [facility] nursing team will follow along with you.   Thanks, Ladona Mow, MSN, RN, GNP, [place], [facility]ON-AP [phone])

## 2013-06-15 NOTE — Progress Notes (Signed)
1 Day Post-Op Lap LAR with coloanal anastomosis and diverting loop ileostomy Subjective: Doing well.  Vitals and labs stable.  PCA working for pain  Objective: Vital signs in last 24 hours: Temp:  [97.5 F (36.4 C)-99.4 F (37.4 C)] 97.7 F (36.5 C) (10/28 0810) Pulse Rate:  [65-96] 94 (10/28 0810) Resp:  [10-23] 22 (10/28 0810) BP: (91-145)/(55-78) 92/55 mmHg (10/28 0810) SpO2:  [96 %-100 %] 100 % (10/28 0810) FiO2 (%):  [100 %] 100 % (10/27 1343)   Intake/Output from previous day: 10/27 0701 - 10/28 0700 In: 5809.5 [P.O.:1; I.V.:5807.5] Out: 1705 [Urine:1250; Drains:305; Blood:150] Intake/Output this shift:    General appearance: alert and cooperative GI: normal findings: soft, appropriately tender, non-distended  Incision: no significant drainage  Lab Results:   Recent Labs  06/15/13 0350  WBC 8.4  HGB 11.8*  HCT 34.0*  PLT 234   BMET  Recent Labs  06/15/13 0350  NA 134*  K 4.1  CL 99  CO2 29  GLUCOSE 99  BUN 8  CREATININE 0.90  CALCIUM 9.0   PT/INR No results found for this basename: LABPROT, INR,  in the last 72 hours ABG No results found for this basename: PHART, PCO2, PO2, HCO3,  in the last 72 hours  MEDS, Scheduled . alvimopan  12 mg Oral BID  . enoxaparin (LOVENOX) injection  40 mg Subcutaneous Q24H  . influenza vac split quadrivalent PF  0.5 mL Intramuscular Tomorrow-1000  . magnesium sulfate 1 - 4 g bolus IVPB  4 g Intravenous Once  . morphine   Intravenous Q4H  . nicotine  21 mg Transdermal Q24H  . pneumococcal 23 valent vaccine  0.5 mL Intramuscular Tomorrow-1000    Studies/Results: No results found.  Assessment: s/p Procedure(s): LAPAROSCOPIC LOW ANTERIOR RESECTION AND DIVERTING LAPAROSCOPIC ILEOSTOMY Patient Active Problem List   Diagnosis Date Noted  . Rectal cancer 02/25/2013  . Screening for malignant neoplasm of the cervix 12/25/2012  . Routine gynecological examination 12/25/2012  . Diarrhea 12/25/2012    Expected  post op course  Plan: Advance diet to clear liquids Decrease MIV Replace Mg Ambulate, Inc Spirometry   LOS: 1 day     .Vanita Panda, MD [facility] Surgery, [place] [phone]   06/15/2013 8:26 AM

## 2013-06-15 NOTE — Care Management Note (Addendum)
    Page 1 of 1   06/16/2013     2:17:47 PM   CARE MANAGEMENT NOTE 06/16/2013  Patient:  Angela Schmidt,Angela Schmidt   Account Number:  [id number]  Date Initiated:  06/15/2013  Documentation initiated by:  Lorenda Ishihara  Subjective/Objective Assessment:   52 yo female admitted s/p LAR, Ileostomy. PTA lived at home with daughter.     Action/Plan:   Home when stable   Anticipated DC Date:  06/17/2013   Anticipated DC Plan:  HOME W HOME HEALTH SERVICES      DC Planning Services  CM consult      [facility] Choice  HOME HEALTH   Choice offered to / List presented to:  C-1 Patient        HH arranged  HH-1 RN      [facility] agency  [facility]   Status of service:  Completed, signed off Medicare Important Message given?   (If response is "NO", the following Medicare IM given date fields will be blank) Date Medicare IM given:   Date Additional Medicare IM given:    Discharge Disposition:  HOME W HOME HEALTH SERVICES  Per UR Regulation:  Reviewed for med. necessity/level of care/duration of stay  If discussed at Long Length of Stay Meetings, dates discussed:    Comments:  06-16-13 Lorenda Ishihara RN CM 1200 Spoke with patient at bedside. States she is agreeable to [facility] and is familiar with [facility]. Contacted Kristen with [facility] to arrange [facility] RN for ostomy care. Awaiting final orders.

## 2013-06-16 LAB — CBC
HCT: 32.1 % — ABNORMAL LOW (ref 36.0–46.0)
MCH: 33.3 pg (ref 26.0–34.0)
MCHC: 34.3 g/dL (ref 30.0–36.0)
MCV: 97.3 fL (ref 78.0–100.0)
Platelets: 220 10*3/uL (ref 150–400)
RDW: 15.4 % (ref 11.5–15.5)
WBC: 6.9 10*3/uL (ref 4.0–10.5)

## 2013-06-16 LAB — BASIC METABOLIC PANEL
BUN: 7 mg/dL (ref 6–23)
CO2: 29 mEq/L (ref 19–32)
Calcium: 8.6 mg/dL (ref 8.4–10.5)
Chloride: 101 mEq/L (ref 96–112)
Creatinine, Ser: 0.78 mg/dL (ref 0.50–1.10)
Glucose, Bld: 100 mg/dL — ABNORMAL HIGH (ref 70–99)

## 2013-06-16 MED ORDER — MORPHINE SULFATE 2 MG/ML IJ SOLN: 2.0000 mg | INTRAMUSCULAR | Status: DC | PRN

## 2013-06-16 MED ORDER — OXYCODONE-ACETAMINOPHEN 5-325 MG PO TABS
1.0000 | ORAL_TABLET | ORAL | Status: DC | PRN
  Administered 2013-06-16 – 2013-06-17 (×5): 1 via ORAL
  Filled 2013-06-16 (×5): qty 1

## 2013-06-16 NOTE — Consult Note (Addendum)
WOC ostomy follow-up consult note Stoma type/location: Ileostomy to right lower quad.  Pouch intact with good seal. Stomal assessment/size: Stoma red and viable when visualized through bag, not removed at this time since new pouch applied yesterday. Output 30cc dark green liquid stool Ostomy pouching: 2pc.  Education provided: Demonstrated emptying.  Pt able to open and close velcro to empty without assistance.  Discussed pouching routines and ordering supplies.  Reviewed dietary precautions and avoiding dehydration.  Pt asks appropriate questions.  Placed on [place] discharge program. Cammie Mcgee MSN, RN, Rober Minion, [place], CNS [phone]

## 2013-06-16 NOTE — Progress Notes (Signed)
2 Days Post-Op Lap LAR with coloanal anastomosis and diverting loop ileostomy Subjective: Doing well.  Feeling better.  Vitals and labs stable.  PCA working for pain  Objective: Vital signs in last 24 hours: Temp:  [97.9 F (36.6 C)-99 F (37.2 C)] 98.3 F (36.8 C) (10/29 0500) Pulse Rate:  [83-97] 83 (10/29 0500) Resp:  [10-24] 24 (10/29 0500) BP: (91-97)/(56-61) 97/61 mmHg (10/29 0500) SpO2:  [94 %-100 %] 100 % (10/29 0500) FiO2 (%):  [99 %] 99 % (10/29 0404)   Intake/Output from previous day: 10/28 0701 - 10/29 0700 In: 1292.5 [P.O.:240; I.V.:952.5; IV Piggyback:100] Out: 3336 [Urine:3050; Drains:286] Intake/Output this shift:    General appearance: alert and cooperative GI: normal findings: soft, appropriately tender, non-distended Ostomy: pink, viable, bilious output in bag Incision: no significant drainage  Lab Results:   Recent Labs  06/15/13 0350 06/16/13 0422  WBC 8.4 6.9  HGB 11.8* 11.0*  HCT 34.0* 32.1*  PLT 234 220   BMET  Recent Labs  06/15/13 0350 06/16/13 0422  NA 134* 133*  K 4.1 3.6  CL 99 101  CO2 29 29  GLUCOSE 99 100*  BUN 8 7  CREATININE 0.90 0.78  CALCIUM 9.0 8.6   PT/INR No results found for this basename: LABPROT, INR,  in the last 72 hours ABG No results found for this basename: PHART, PCO2, PO2, HCO3,  in the last 72 hours  MEDS, Scheduled . enoxaparin (LOVENOX) injection  40 mg Subcutaneous Q24H  . nicotine  21 mg Transdermal Q24H    Studies/Results: No results found.  Assessment: s/p Procedure(s): LAPAROSCOPIC LOW ANTERIOR RESECTION AND DIVERTING LAPAROSCOPIC ILEOSTOMY Patient Active Problem List   Diagnosis Date Noted  . Rectal cancer 02/25/2013  . Screening for malignant neoplasm of the cervix 12/25/2012  . Routine gynecological examination 12/25/2012  . Diarrhea 12/25/2012    Expected post op course  Plan: Advance diet to regular SL IV Ostomy teaching today Ambulate, Inc Spirometry Possible d/c tom or  Fri  LOS: 2 days     .Vanita Panda, MD [facility] Surgery, [place] [phone]   06/16/2013 8:39 AM

## 2013-06-17 LAB — BASIC METABOLIC PANEL
BUN: 5 mg/dL — ABNORMAL LOW (ref 6–23)
Creatinine, Ser: 0.82 mg/dL (ref 0.50–1.10)
GFR calc Af Amer: >90 mL/min (ref >90)
GFR calc non Af Amer: 81 mL/min — ABNORMAL LOW (ref >90)
Glucose, Bld: 87 mg/dL (ref 70–99)
Potassium: 3.7 mEq/L (ref 3.5–5.1)
Sodium: 137 mEq/L (ref 135–145)

## 2013-06-17 LAB — CBC
Hemoglobin: 10.7 g/dL — ABNORMAL LOW (ref 12.0–15.0)
MCH: 33 pg (ref 26.0–34.0)
MCHC: 34.3 g/dL (ref 30.0–36.0)
Platelets: 226 10*3/uL (ref 150–400)
RBC: 3.24 MIL/uL — ABNORMAL LOW (ref 3.87–5.11)
RDW: 15.2 % (ref 11.5–15.5)

## 2013-06-17 MED ORDER — TRAMADOL HCL 50 MG PO TABS: 50.0000 mg | ORAL_TABLET | Freq: Four times a day (QID) | ORAL | Status: DC | PRN

## 2013-06-17 MED ORDER — OXYCODONE-ACETAMINOPHEN 5-325 MG PO TABS: 1.0000 | ORAL_TABLET | ORAL | Status: DC | PRN

## 2013-06-17 NOTE — Discharge Summary (Signed)
Physician Discharge Summary  Patient ID: Angela Schmidt MRN: [id number] DOB/AGE: 51-Aug-1962 51 y.o.  Admit date: 06/14/2013 Discharge date: 06/17/2013  Admission Diagnoses: distal rectal cancer  Discharge Diagnoses: distal rectal cancer Active Problems:   * No active hospital problems. *   Discharged Condition: good  Hospital Course: the patient was admitted to the hospital after a low anterior resection with coloanal anastomosis and diverting loop ileostomy. She did well after surgery. Her diet was advanced to clear liquids on postop day 1. On postop day 2 her Foley catheter was removed and she urinated without difficulty. She began ostomy teaching at this point as well. She started to have ileostomy output on postop day 2 and was switched for a low fiber diet. She tolerated regular foods. Her IV was stopped and she was able to keep herself hydrated with by mouth fluids. By postop day 3 the patient was determined to be in stable condition for discharge to home. She will follow up with me late next week or early the week after that. I have carefully instructed her on the warning signs of dehydration and how to monitor her ostomy output appropriately. She expressed understanding of this. She will call the office if she has any difficulty. She will have a home health nurse to also help her were now to change her ostomy appliance at home as well.  Consults: None  Significant Diagnostic Studies: labs: CBC, Chemistry  Treatments: IV hydration, analgesia: acetaminophen w/ codeine, therapies: Enterstomal and surgery: LAR  Discharge Exam: Blood pressure 107/61, pulse 94, temperature 99.4 F (37.4 C), temperature source Oral, resp. rate 20, height 5\' 3"  (1.6 m), weight 105 lb (47.628 kg), SpO2 97.00%. General appearance: alert and cooperative GI: normal findings: soft, appropriately tender Incision/Wound: clean, dry, intact, mild ecchymosis   Disposition: 01-Home or Self Care   Future  Appointments Provider Department Dept Phone   07/09/2013 10:30 AM Ladene Artist, MD  [facility] [phone]       Medication List         nicotine 21 mg/24hr patch  Commonly known as:  NICODERM CQ - dosed in mg/24 hours  Place 1 patch onto the skin daily.     ondansetron 8 MG tablet  Commonly known as:  ZOFRAN  Take 1 tablet (8 mg total) by mouth every 8 (eight) hours as needed for nausea.     oxyCODONE-acetaminophen 5-325 MG per tablet  Commonly known as:  PERCOCET/ROXICET  Take 1-2 tablets by mouth every 4 (four) hours as needed.     prochlorperazine 10 MG tablet  Commonly known as:  COMPAZINE  Take 10 mg by mouth every 6 (six) hours as needed (nausea).     traMADol 50 MG tablet  Commonly known as:  ULTRAM  Take 1 tablet (50 mg total) by mouth every 6 (six) hours as needed for pain.           Follow-up Information   Follow up with Vanita Panda., MD. Schedule an appointment as soon as possible for a visit in 10 days.   Specialty:  General Surgery   Contact information:   [address] [place] [place] [address] [phone]       Signed: Vanita Panda 06/17/2013, 11:01 AM

## 2013-06-17 NOTE — Progress Notes (Signed)
Pt given discharge instructions and verbalized understanding.  Pt verbalized that she feels good with her ostomy care and has a home health nurse to help at home.  Pt voiced no concerns at time of discharge.

## 2013-06-24 ENCOUNTER — Ambulatory Visit (INDEPENDENT_AMBULATORY_CARE_PROVIDER_SITE_OTHER): Payer: BC Managed Care – PPO | Admitting: General Surgery

## 2013-06-24 ENCOUNTER — Encounter (INDEPENDENT_AMBULATORY_CARE_PROVIDER_SITE_OTHER): Payer: Self-pay | Admitting: General Surgery

## 2013-06-24 VITALS — BP 120/82 | HR 88 | Temp 98.3°F | Resp 16 | Ht 63.0 in | Wt 99.4 lb

## 2013-06-24 DIAGNOSIS — C20 Malignant neoplasm of rectum: Secondary | ICD-10-CM

## 2013-06-24 NOTE — Progress Notes (Signed)
Angela Schmidt is a 52 y.o. female who is here for a follow up visit regarding her LAR and coloanal anastomosis for distal rectal cancer.  Her final pathology revealed a T2N0 lesion.  Her margins were clear.  She is having some difficulty with fatigue and loss of appetite.  Otherwise, she is doing well.  She is using imodium to control her ostomy output.   Objective: Filed Vitals:   06/24/13 1503  BP: 120/82  Pulse: 88  Temp: 98.3 F (36.8 C)  Resp: 16    General appearance: alert and cooperative GI: normal findings: soft, non-tender Ileostomy functioning wel  Assessment and Plan: Doing well.  Return to the office in 3 weeks for anal exam.  Cont to titrate imodium as needed.      Vanita Panda, MD [facility] Surgery, [place] [phone]

## 2013-06-24 NOTE — Patient Instructions (Signed)
Return to the office in 3 weeks.  Titrate your imodium as needed.

## 2013-06-28 ENCOUNTER — Telehealth (INDEPENDENT_AMBULATORY_CARE_PROVIDER_SITE_OTHER): Payer: Self-pay | Admitting: *Deleted

## 2013-06-28 NOTE — Telephone Encounter (Signed)
Patient called in to report that she is having 8/10 pain and rectal mucous discharge.  Patient states she was feeling fine last week but these symptoms started over the weekend.  Patient states normal BMs into bag and normal appetite.  Patient denies any fevers.  Spoke to [employer] who spoke to Dr. Maisie Fus who stated that the mucous was normal and that patient needs to use sitz baths to help with the pain.  Patient updated at this time and agreeable.

## 2013-06-29 NOTE — Telephone Encounter (Signed)
Gaylyn Rong with [facility] is out at the patients home at this time and concerned about patient's pain.  She states that the pain is severe even with taking pain medication.  When I asked about the sitz baths she states that the pain is no longer in the rectal area it is now higher and in the lower back.  Explained that I will send a message to Dr. Maisie Fus who is here in office to try to get some guidance.  Patient reports that she is having difficulty walking and sitting due to the severe pain.

## 2013-06-29 NOTE — Telephone Encounter (Signed)
Appt made for 330p tomorrow afternoon.  Patient updated and agreeable with appt and POC at this time.  Gaylyn Rong was still with patient and she is also agreeable

## 2013-06-29 NOTE — Telephone Encounter (Signed)
I would be glad to see her in the office tomorrow afternoon for an exam.  I can be there after 3pm

## 2013-06-30 ENCOUNTER — Encounter (INDEPENDENT_AMBULATORY_CARE_PROVIDER_SITE_OTHER): Payer: BC Managed Care – PPO | Admitting: General Surgery

## 2013-06-30 ENCOUNTER — Ambulatory Visit (INDEPENDENT_AMBULATORY_CARE_PROVIDER_SITE_OTHER): Payer: BC Managed Care – PPO | Admitting: Surgery

## 2013-06-30 ENCOUNTER — Encounter (INDEPENDENT_AMBULATORY_CARE_PROVIDER_SITE_OTHER): Payer: Self-pay | Admitting: Surgery

## 2013-06-30 VITALS — BP 101/72 | HR 82 | Temp 97.8°F | Resp 14 | Ht 63.0 in | Wt 98.8 lb

## 2013-06-30 DIAGNOSIS — Z09 Encounter for follow-up examination after completed treatment for conditions other than malignant neoplasm: Secondary | ICD-10-CM

## 2013-06-30 MED ORDER — OXYCODONE-ACETAMINOPHEN 5-325 MG PO TABS: 1.0000 | ORAL_TABLET | ORAL | Status: DC | PRN

## 2013-06-30 MED ORDER — KETOROLAC TROMETHAMINE 10 MG PO TABS: 10.0000 mg | ORAL_TABLET | Freq: Four times a day (QID) | ORAL | Status: DC | PRN

## 2013-06-30 NOTE — Progress Notes (Signed)
Subjective:     Patient ID: Angela Schmidt, female   DOB: [birth year], 52 y.o.   MRN: [id number]  HPI This is a patient who is over 2 weeks status post low anterior resection with a coloanal anastomosis and diverting ileostomy by Dr. Maisie Fus. She has been doing well until the past several days when she developed worsening lower back and buttock discomfort. She is eating well and moving her bowels well. She has had no nausea or vomiting or fevers.  Review of Systems     Objective:   Physical Exam On exam, her abdomen is soft and nontender. Her perianal area shows minimal excoriation. Her tenderness is along her bilateral buttock at the ischial  tuberosities and coccyx    Assessment:     Postoperative pain     Plan:     I discussed this with Dr. Maisie Fus by phone. I am going to hold on a CAT scan as she does not appear to have any signs of sepsis or anastomotic problems. This appeared to be all musculoskeletal pain. I am going to try her on Toradol family were Percocet. If she is not improved in 4-5 days she will call stat. If she does, she will keep her on Dr. Maisie Fus

## 2013-07-01 ENCOUNTER — Telehealth (INDEPENDENT_AMBULATORY_CARE_PROVIDER_SITE_OTHER): Payer: Self-pay

## 2013-07-01 NOTE — Telephone Encounter (Signed)
Angela Schmidt with [facility] called to let Dr Maisie Fus know patient is independent with ostomy care. She is requesting patient be discharged from [facility] since needs have been met. Advised I will send request to Dr Maisie Fus a note and will call her back. [phone]

## 2013-07-01 NOTE — Telephone Encounter (Signed)
Called Gaylyn Rong back and let her know Dr Maisie Fus okay to D/C [facility] orders.

## 2013-07-01 NOTE — Telephone Encounter (Signed)
Yes, that's fine 

## 2013-07-05 ENCOUNTER — Telehealth (INDEPENDENT_AMBULATORY_CARE_PROVIDER_SITE_OTHER): Payer: Self-pay | Admitting: General Surgery

## 2013-07-05 NOTE — Telephone Encounter (Signed)
Pt called to report to Dr. Maisie Fus she is still having pain and discharge.  She was told to report it if not improving.

## 2013-07-06 NOTE — Telephone Encounter (Signed)
Pt calling it to let Huntley Dec know that she is doing a lot better.  She is still having some pain, however taking ibuprofen in between her pain medications is helping.  She states that the pain is "nowhere near what it was".

## 2013-07-06 NOTE — Telephone Encounter (Signed)
Called pt to check on her.  Left message for her to call.  If she is not getting worse, she should continue pain meds per Dr Maisie Fus and see her at her OV next week.  I wanted to make sure she is not having nausea, vomiting, or fever.  She may want to add in some Ibuprofen.

## 2013-07-07 ENCOUNTER — Telehealth (INDEPENDENT_AMBULATORY_CARE_PROVIDER_SITE_OTHER): Payer: Self-pay

## 2013-07-07 NOTE — Telephone Encounter (Signed)
Pt is s/p laparoscopic low anterior resection with an ileostomy.  She is calling today to report that she woke up this morning with stool in her underwear and on her sheets.  She is also having substantial rectal pain. No fever, chills, and there is stool output in her bag from the ileostomy.  Dr. Maisie Fus was paged and stated that it may be residual stool in the patient's colon that has worked it's way out through her rectum.  Says it is still to early for a physical exam because of healing process.  Pt was told she could take 800mg  of Ibuprofen q 8 hours as well as her pain medication if that is helping.  She will call if she notices more stool from the rectum or has any other concerns. Pt reminded of her appt with Dr. Maisie Fus.

## 2013-07-09 ENCOUNTER — Encounter: Payer: Self-pay | Admitting: Oncology

## 2013-07-09 ENCOUNTER — Ambulatory Visit (HOSPITAL_BASED_OUTPATIENT_CLINIC_OR_DEPARTMENT_OTHER): Payer: BC Managed Care – PPO | Admitting: Oncology

## 2013-07-09 ENCOUNTER — Other Ambulatory Visit: Payer: Self-pay | Admitting: *Deleted

## 2013-07-09 ENCOUNTER — Telehealth: Payer: Self-pay | Admitting: Oncology

## 2013-07-09 VITALS — BP 97/63 | HR 81 | Temp 97.4°F | Resp 18 | Ht 63.0 in | Wt 98.5 lb

## 2013-07-09 DIAGNOSIS — C20 Malignant neoplasm of rectum: Secondary | ICD-10-CM

## 2013-07-09 DIAGNOSIS — M069 Rheumatoid arthritis, unspecified: Secondary | ICD-10-CM

## 2013-07-09 DIAGNOSIS — F17200 Nicotine dependence, unspecified, uncomplicated: Secondary | ICD-10-CM

## 2013-07-09 MED ORDER — FLUCONAZOLE 100 MG PO TABS: 100.0000 mg | ORAL_TABLET | Freq: Every day | ORAL | Status: DC

## 2013-07-09 MED ORDER — CAPECITABINE 500 MG PO TABS: 1500.0000 mg | ORAL_TABLET | Freq: Two times a day (BID) | ORAL | Status: DC

## 2013-07-09 NOTE — Progress Notes (Signed)
Met with patient to assess for needs or barriers to care.  She reports doing very well in the care of her ostomy.  She inquired about specific devices to use with ostomy.  She was given  written information on [employer] and will contact Berline Lopes WOC RN for an appointment.  She denies any further assist.  Will continue to follow as needed.

## 2013-07-09 NOTE — Progress Notes (Signed)
Oxaliplatin drug sheet given to pt -reminded pt to wear cold weather clothing when she comes in for treatment.

## 2013-07-09 NOTE — Progress Notes (Signed)
   [facility]    OFFICE PROGRESS NOTE   INTERVAL HISTORY:   She returns for scheduled followup of rectal cancer. She underwent a low anterior resection and temporary ileostomy on 06/14/2013. The pathology revealed 2 foci of adenocarcinoma measuring 0.3 and 0.2 cm. Tumor involved the muscularis propria. The resection margins were negative. A neoadjuvant treatment effect was noted. 9 lymph nodes were negative for metastatic carcinoma. Scattered foci within the rectal wall consistent chronic inflammation and giant cell reaction consistent with treatment effect. These foci were predominantly within the submucosa, muscularis, and focally within the immediate adjacent connective tissue.  She continues to have pain at the rectum following surgery. She takes one half of and oxycodone tablets and ibuprofen for relief of the pain. The ileostomy is functioning well. She reports diminished taste. Her appetite is poor. She is drinking adequate fluids. Objective:  Vital signs in last 24 hours:  Blood pressure 97/63, pulse 81, temperature 97.4 F (36.3 C), temperature source Oral, resp. rate 18, height 5\' 3"  (1.6 m), weight 98 lb 8 oz (44.679 kg), last menstrual period 03/10/2013.    HEENT: Thrush at the tongue and pharynx. Resp: Lungs with end inspiratory coarse rhonchi at the upper posterior chest, no respiratory distress Cardio: Regular rate and rhythm GI: No hepatomegaly, right lower abdomen ileostomy with formed brown stool Vascular: No leg edema  Skin: No erythema or skin breakdown at the perineum     Lab Results:  Lab Results  Component Value Date   WBC 6.8 06/17/2013   HGB 10.7* 06/17/2013   HCT 31.2* 06/17/2013   MCV 96.3 06/17/2013   PLT 226 06/17/2013      Medications: I have reviewed the patient's current medications.  Assessment/Plan: 1. Rectal cancer, clinical stage III (uT3 uN1) measured at 7 cm from the anal verge.  No evidence for metastatic disease on  CTs of the chest, abdomen and pelvis 02/24/2013.  Elevated CEA.  Initiation of neoadjuvant Xeloda and radiation on 03/22/2013, completed 04/29/2013 Status post a low anterior resection and diverting ileostomy on 06/14/2013-ypT2,ypN0 2. "Rheumatoid" arthritis. 3. Tobacco use. 4. Trachea/bronchial nodularity noted on the chest CT 02/24/2013. 5. Oral candidiasis-she will complete a course of Diflucan   Disposition:  I discussed the surgical pathology with Ms. Anise Salvo. She appears to have responded to neoadjuvant therapy. I recommend completing a course of adjuvant systemic therapy based on the pre-treatment clinical staging. We discussed Xeloda and oxaliplatin chemotherapy. I explained the lack of data to confirm a benefit of adjuvant oxaliplatin therapy in patients with rectal cancer. Oxaliplatin is considered in the adjuvant setting based on data from the colon cancer literature. I explained the potential increased cure rate with the addition of oxaliplatin to Xeloda. I recommend Xelox based on the pretreatment endoscopic ultrasound suggesting lymph node metastases.  We reviewed the potential toxicities associated with oxaliplatin including the chance for nausea/vomiting, hematologic toxicity, and neuropathy. Her initial impression is that she is not comfortable with receiving oxaliplatin.   She agrees to adjuvant Xeloda. She will contact us next week if she decides to consider oxaliplatin therapy.  The plan is to begin a first cycle of adjuvant Xeloda on 07/16/2013. She will contact us and discontinue Xeloda if she develops diarrhea or hand/foot pain. The plan is to complete 5 cycles of adjuvant Xeloda.   Thornton Papas, MD  07/09/2013  12:08 PM

## 2013-07-09 NOTE — Telephone Encounter (Signed)
Xeloda script to begin on 07/16/13 forwarded to managed care department.

## 2013-07-09 NOTE — Progress Notes (Signed)
Faxed xeloda prescription to [facility] °

## 2013-07-09 NOTE — Telephone Encounter (Signed)
Gave pt appt for December 2014 lab and ML

## 2013-07-13 ENCOUNTER — Ambulatory Visit (INDEPENDENT_AMBULATORY_CARE_PROVIDER_SITE_OTHER): Payer: BC Managed Care – PPO | Admitting: General Surgery

## 2013-07-13 ENCOUNTER — Encounter (INDEPENDENT_AMBULATORY_CARE_PROVIDER_SITE_OTHER): Payer: Self-pay | Admitting: General Surgery

## 2013-07-13 VITALS — BP 110/60 | HR 60 | Temp 97.0°F | Resp 18 | Ht 63.0 in | Wt 97.8 lb

## 2013-07-13 DIAGNOSIS — K624 Stenosis of anus and rectum: Secondary | ICD-10-CM

## 2013-07-13 NOTE — Progress Notes (Signed)
Angela Schmidt is a 52 y.o. female who is status post a LAR and coloanal anastomosis on 10/27.  SHe has had a lot of pelvic pain recently, but this is getting better.  She is eating well and her ostomy output is <1L.  She has lost less than 5 pounds since surgery.  She is having some purulent discharge anally.  Objective: Filed Vitals:   07/13/13 1007  BP: 110/60  Pulse: 60  Temp: 97 F (36.1 C)  Resp: 18    General appearance: alert and cooperative GI: normal findings: soft, non-tender Ostomy pink with thick bilious output   Incision: healing well DRE: she has a posterior abscess cavity with tracking laterally. It appears her colo-anal anastomosis has broken down. She has minimal pain to palpation  Assessment: s/p  Patient Active Problem List   Diagnosis Date Noted  . Rectal cancer 02/25/2013  . Screening for malignant neoplasm of the cervix 12/25/2012  . Routine gynecological examination 12/25/2012  . Diarrhea 12/25/2012    Plan: CHEILA WICKSTROM is doing well postop except for a nonhealing coloanal anastomosis. 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 I would like to take her to the operating room in the next couple weeks to evaluate this further and make sure that the rest of her pelvic cavity is draining appropriately. She is agreeable to this plan.    Vanita Panda, MD [facility] Surgery, [place] [phone]   07/13/2013 10:37 AM

## 2013-07-13 NOTE — Patient Instructions (Signed)
We will schedule you for an exam under anesthesia to evaluate your anal canal better.

## 2013-07-14 ENCOUNTER — Telehealth (INDEPENDENT_AMBULATORY_CARE_PROVIDER_SITE_OTHER): Payer: Self-pay | Admitting: General Surgery

## 2013-07-14 ENCOUNTER — Telehealth: Payer: Self-pay | Admitting: *Deleted

## 2013-07-14 ENCOUNTER — Encounter (HOSPITAL_BASED_OUTPATIENT_CLINIC_OR_DEPARTMENT_OTHER): Payer: Self-pay | Admitting: *Deleted

## 2013-07-14 NOTE — Progress Notes (Signed)
NPO AFTER MN. ARRIVE AT 0830. NEEDS ISTAT. WILL TAKE XELODA AND PAIN RX IF NEEDED W/ SIPS OF WATER AM DOS.

## 2013-07-14 NOTE — Telephone Encounter (Signed)
Has surgery 12/3 calling to ask if she can take her chemo pills morning of surgery please call and advise

## 2013-07-14 NOTE — Telephone Encounter (Signed)
Due to begin Xeloda on 11/28 and having rectal exam/surgery under anesthesia on 12/3 and asking if this is OK? Per Dr. Truett Perna: OK, should be no problem. She will call with any problems.

## 2013-07-14 NOTE — Telephone Encounter (Signed)
Yes, that's fine 

## 2013-07-16 NOTE — Telephone Encounter (Signed)
RECEIVED A FAX FROM [facility] CONCERNING A CONFIRMATION OF PRESCRIPTION SHIPMENT FOR XELODA ON 07/13/13.

## 2013-07-19 NOTE — Telephone Encounter (Signed)
Left message for pt to call and I can let her know it is ok to take her chemo pills the morning of surgery.  She should only take them with a sip of water.

## 2013-07-19 NOTE — Telephone Encounter (Signed)
Patient made aware she can take chemo pills with sip of water the morning of surgery.

## 2013-07-21 ENCOUNTER — Ambulatory Visit (HOSPITAL_BASED_OUTPATIENT_CLINIC_OR_DEPARTMENT_OTHER)
Admission: RE | Admit: 2013-07-21 | Discharge: 2013-07-21 | Disposition: A | Discharge status: Home / Self Care | Payer: BC Managed Care – PPO | Source: Ambulatory Visit | Attending: General Surgery | Admitting: General Surgery

## 2013-07-21 ENCOUNTER — Ambulatory Visit (HOSPITAL_BASED_OUTPATIENT_CLINIC_OR_DEPARTMENT_OTHER): Payer: BC Managed Care – PPO | Admitting: Anesthesiology

## 2013-07-21 ENCOUNTER — Encounter (HOSPITAL_BASED_OUTPATIENT_CLINIC_OR_DEPARTMENT_OTHER): Payer: BC Managed Care – PPO | Admitting: Anesthesiology

## 2013-07-21 ENCOUNTER — Encounter (HOSPITAL_BASED_OUTPATIENT_CLINIC_OR_DEPARTMENT_OTHER)
Admission: RE | Disposition: A | Discharge status: Home / Self Care | Payer: Self-pay | Source: Ambulatory Visit | Attending: General Surgery

## 2013-07-21 ENCOUNTER — Encounter (HOSPITAL_BASED_OUTPATIENT_CLINIC_OR_DEPARTMENT_OTHER): Payer: Self-pay | Admitting: Anesthesiology

## 2013-07-21 DIAGNOSIS — J438 Other emphysema: Secondary | ICD-10-CM | POA: Diagnosis not present

## 2013-07-21 DIAGNOSIS — F17200 Nicotine dependence, unspecified, uncomplicated: Secondary | ICD-10-CM | POA: Diagnosis not present

## 2013-07-21 DIAGNOSIS — Z87442 Personal history of urinary calculi: Secondary | ICD-10-CM | POA: Diagnosis not present

## 2013-07-21 DIAGNOSIS — Z932 Ileostomy status: Secondary | ICD-10-CM | POA: Diagnosis not present

## 2013-07-21 DIAGNOSIS — C19 Malignant neoplasm of rectosigmoid junction: Secondary | ICD-10-CM | POA: Insufficient documentation

## 2013-07-21 DIAGNOSIS — K624 Stenosis of anus and rectum: Secondary | ICD-10-CM | POA: Diagnosis not present

## 2013-07-21 DIAGNOSIS — N949 Unspecified condition associated with female genital organs and menstrual cycle: Secondary | ICD-10-CM | POA: Insufficient documentation

## 2013-07-21 HISTORY — DX: Other complications of anesthesia, initial encounter: T88.59XA

## 2013-07-21 HISTORY — DX: Presence of spectacles and contact lenses: Z97.3

## 2013-07-21 HISTORY — DX: Stenosis of anus and rectum: K62.4

## 2013-07-21 HISTORY — DX: Ileostomy status: Z93.2

## 2013-07-21 HISTORY — PX: FLEXIBLE SIGMOIDOSCOPY: SHX5431

## 2013-07-21 HISTORY — DX: Adverse effect of unspecified anesthetic, initial encounter: T41.45XA

## 2013-07-21 HISTORY — PX: EXAMINATION UNDER ANESTHESIA: SHX1540

## 2013-07-21 LAB — POCT I-STAT 4, (NA,K, GLUC, HGB,HCT)
Glucose, Bld: 89 mg/dL (ref 70–99)
HCT: 37 % (ref 36.0–46.0)
Hemoglobin: 12.6 g/dL (ref 12.0–15.0)
Sodium: 142 mEq/L (ref 135–145)

## 2013-07-21 SURGERY — EXAM UNDER ANESTHESIA
Anesthesia: General | Site: Rectum

## 2013-07-21 MED ORDER — OXYCODONE HCL 5 MG PO TABS
5.0000 mg | ORAL_TABLET | Freq: Once | ORAL | Status: AC | PRN
  Administered 2013-07-21: 5 mg via ORAL
  Filled 2013-07-21: qty 1

## 2013-07-21 MED ORDER — BUPIVACAINE-EPINEPHRINE 0.5% -1:200000 IJ SOLN
INTRAMUSCULAR | Status: DC | PRN
  Administered 2013-07-21: 20 mL

## 2013-07-21 MED ORDER — LACTATED RINGERS IV SOLN
INTRAVENOUS | Status: DC
  Administered 2013-07-21 (×2): via INTRAVENOUS
  Filled 2013-07-21: qty 1000

## 2013-07-21 MED ORDER — MIDAZOLAM HCL 2 MG/2ML IJ SOLN
INTRAMUSCULAR | Status: AC
  Filled 2013-07-21: qty 2

## 2013-07-21 MED ORDER — OXYCODONE HCL 5 MG PO TABS
ORAL_TABLET | ORAL | Status: AC
  Filled 2013-07-21: qty 1

## 2013-07-21 MED ORDER — PROPOFOL 10 MG/ML IV BOLUS
INTRAVENOUS | Status: DC | PRN
  Administered 2013-07-21: 130 mg via INTRAVENOUS

## 2013-07-21 MED ORDER — LIDOCAINE 5 % EX OINT
TOPICAL_OINTMENT | CUTANEOUS | Status: DC | PRN
  Administered 2013-07-21: 1

## 2013-07-21 MED ORDER — LIDOCAINE HCL (CARDIAC) 20 MG/ML IV SOLN
INTRAVENOUS | Status: DC | PRN
  Administered 2013-07-21: 50 mg via INTRAVENOUS

## 2013-07-21 MED ORDER — FENTANYL CITRATE 0.05 MG/ML IJ SOLN
INTRAMUSCULAR | Status: AC
  Filled 2013-07-21: qty 2

## 2013-07-21 MED ORDER — OXYCODONE HCL 5 MG/5ML PO SOLN
5.0000 mg | Freq: Once | ORAL | Status: AC | PRN
  Filled 2013-07-21: qty 5

## 2013-07-21 MED ORDER — KETOROLAC TROMETHAMINE 30 MG/ML IJ SOLN
INTRAMUSCULAR | Status: DC | PRN
  Administered 2013-07-21: 30 mg via INTRAVENOUS

## 2013-07-21 MED ORDER — MIDAZOLAM HCL 5 MG/5ML IJ SOLN
INTRAMUSCULAR | Status: DC | PRN
  Administered 2013-07-21: 2 mg via INTRAVENOUS

## 2013-07-21 MED ORDER — PROMETHAZINE HCL 25 MG/ML IJ SOLN
6.2500 mg | INTRAMUSCULAR | Status: DC | PRN
  Filled 2013-07-21: qty 1

## 2013-07-21 MED ORDER — OXYCODONE HCL 5 MG PO TABS: 10.0000 mg | ORAL_TABLET | ORAL | Status: DC | PRN

## 2013-07-21 MED ORDER — ONDANSETRON HCL 4 MG/2ML IJ SOLN
INTRAMUSCULAR | Status: DC | PRN
  Administered 2013-07-21: 4 mg via INTRAVENOUS

## 2013-07-21 MED ORDER — FENTANYL CITRATE 0.05 MG/ML IJ SOLN
INTRAMUSCULAR | Status: DC | PRN
  Administered 2013-07-21 (×4): 25 ug via INTRAVENOUS

## 2013-07-21 MED ORDER — DEXAMETHASONE SODIUM PHOSPHATE 4 MG/ML IJ SOLN
INTRAMUSCULAR | Status: DC | PRN
  Administered 2013-07-21: 10 mg via INTRAVENOUS

## 2013-07-21 MED ORDER — ACETAMINOPHEN 10 MG/ML IV SOLN
INTRAVENOUS | Status: DC | PRN
  Administered 2013-07-21: 1000 mg via INTRAVENOUS

## 2013-07-21 MED ORDER — FENTANYL CITRATE 0.05 MG/ML IJ SOLN
25.0000 ug | INTRAMUSCULAR | Status: DC | PRN
  Filled 2013-07-21: qty 1

## 2013-07-21 MED ORDER — HYDROMORPHONE HCL PF 1 MG/ML IJ SOLN
0.2500 mg | INTRAMUSCULAR | Status: DC | PRN
  Filled 2013-07-21: qty 1

## 2013-07-21 SURGICAL SUPPLY — 52 items
APL SKNCLS STERI-STRIP NONHPOA (GAUZE/BANDAGES/DRESSINGS)
BENZOIN TINCTURE PRP APPL 2/3 (GAUZE/BANDAGES/DRESSINGS) ×2 IMPLANT
BLADE HEX COATED 2.75 (ELECTRODE) ×3 IMPLANT
BLADE SURG 10 STRL SS (BLADE) IMPLANT
BLADE SURG 15 STRL LF DISP TIS (BLADE) ×2 IMPLANT
BLADE SURG 15 STRL SS (BLADE) ×3
BRIEF STRETCH FOR OB PAD LRG (UNDERPADS AND DIAPERS) ×6 IMPLANT
CANISTER SUCTION 2500CC (MISCELLANEOUS) ×4 IMPLANT
CLOTH BEACON ORANGE TIMEOUT ST (SAFETY) ×3 IMPLANT
COVER MAYO STAND STRL (DRAPES) ×3 IMPLANT
COVER TABLE BACK 60X90 (DRAPES) ×3 IMPLANT
DECANTER SPIKE VIAL GLASS SM (MISCELLANEOUS) ×2 IMPLANT
DRAPE LG THREE QUARTER DISP (DRAPES) ×1 IMPLANT
DRAPE PED LAPAROTOMY (DRAPES) IMPLANT
DRAPE UNDERBUTTOCKS STRL (DRAPE) ×1 IMPLANT
DRSG PAD ABDOMINAL 8X10 ST (GAUZE/BANDAGES/DRESSINGS) ×1 IMPLANT
ELECT BLADE 6.5 .24CM SHAFT (ELECTRODE) IMPLANT
ELECT REM PT RETURN 9FT ADLT (ELECTROSURGICAL) ×3
ELECTRODE REM PT RTRN 9FT ADLT (ELECTROSURGICAL) ×2 IMPLANT
GAUZE SPONGE 4X4 16PLY XRAY LF (GAUZE/BANDAGES/DRESSINGS) ×3 IMPLANT
GLOVE BIO SURGEON STRL SZ 6.5 (GLOVE) ×3 IMPLANT
GLOVE BIOGEL M STER SZ 6 (GLOVE) ×1 IMPLANT
GLOVE BIOGEL PI IND STRL 6.5 (GLOVE) IMPLANT
GLOVE BIOGEL PI IND STRL 7.5 (GLOVE) IMPLANT
GLOVE BIOGEL PI INDICATOR 6.5 (GLOVE) ×1
GLOVE BIOGEL PI INDICATOR 7.5 (GLOVE) ×1
GLOVE INDICATOR 7.0 STRL GRN (GLOVE) ×3 IMPLANT
GOWN PREVENTION PLUS XXLARGE (GOWN DISPOSABLE) ×1 IMPLANT
GOWN STRL NON-REIN LRG LVL3 (GOWN DISPOSABLE) ×1 IMPLANT
GOWN STRL REIN XL XLG (GOWN DISPOSABLE) ×4 IMPLANT
HEMOSTAT SURGICEL 2X14 (HEMOSTASIS) ×3 IMPLANT
LEGGING LITHOTOMY PAIR STRL (DRAPES) ×1 IMPLANT
LOOP VESSEL MAXI BLUE (MISCELLANEOUS) IMPLANT
NDL SAFETY ECLIPSE 18X1.5 (NEEDLE) IMPLANT
NEEDLE HYPO 18GX1.5 SHARP (NEEDLE)
NEEDLE HYPO 22GX1.5 SAFETY (NEEDLE) ×3 IMPLANT
NS IRRIG 500ML POUR BTL (IV SOLUTION) ×3 IMPLANT
PACK BASIN DAY SURGERY FS (CUSTOM PROCEDURE TRAY) ×3 IMPLANT
PAD ABD 8X10 STRL (GAUZE/BANDAGES/DRESSINGS) ×1 IMPLANT
PENCIL BUTTON HOLSTER BLD 10FT (ELECTRODE) ×3 IMPLANT
SPONGE GAUZE 4X4 12PLY (GAUZE/BANDAGES/DRESSINGS) IMPLANT
SPONGE SURGIFOAM ABS GEL 12-7 (HEMOSTASIS) IMPLANT
SUT CHROMIC 2 0 SH (SUTURE) IMPLANT
SUT CHROMIC 3 0 SH 27 (SUTURE) IMPLANT
SUT ETHIBOND 0 (SUTURE) IMPLANT
SUT VIC AB 2-0 SH 27 (SUTURE) ×3
SUT VIC AB 2-0 SH 27XBRD (SUTURE) IMPLANT
SYR CONTROL 10ML LL (SYRINGE) ×3 IMPLANT
TOWEL OR 17X24 6PK STRL BLUE (TOWEL DISPOSABLE) ×6 IMPLANT
TRAY DSU PREP LF (CUSTOM PROCEDURE TRAY) ×3 IMPLANT
TUBE CONNECTING 12X1/4 (SUCTIONS) ×3 IMPLANT
YANKAUER SUCT BULB TIP NO VENT (SUCTIONS) ×3 IMPLANT

## 2013-07-21 NOTE — H&P (Signed)
Angela Schmidt is an 52 y.o. female.   Chief Complaint: pelvic pain HPI: s/p LAR with coloanal anastomosis.  No fevers, having pelvic pain and foul smelling discharge  Past Medical History  Diagnosis Date  . History of kidney stones   . Complication of anesthesia     "shivers"  . Rectal stenosis   . S/P ileostomy   . Cancer of rectosigmoid (colon) stage III (uT3  uN1)  radiation complete 04-29-2013  currently on oral chemo med---  oncologist-- dr Truett Perna    06-14-2013  s/p anterior resection w/ Diverting ileostomy---  . Arthritis     LUMBAR  . Wears contact lenses     Past Surgical History  Procedure Laterality Date  . Bunionectomy Left 1984  . Eus N/A 02/25/2013    Procedure: LOWER ENDOSCOPIC ULTRASOUND (EUS);  Surgeon: Rachael Fee, MD;  Location: [facility];  Service: Endoscopy;  Laterality: N/A;  radial  . Laparoscopic low anterior resection N/A 06/14/2013    Procedure: LAPAROSCOPIC LOW ANTERIOR RESECTION AND DIVERTING LAPAROSCOPIC ILEOSTOMY;  Surgeon: Romie Levee, MD;  Location: [facility];  Service: General;  Laterality: N/A;  with splenic flexure takdown  . Tubal ligation  1994    Family History  Problem Relation Age of Onset  . Arthritis Mother   . Diabetes Mother   . Thyroid disease Mother   . Arthritis Father   . Diabetes Father   . Parkinson's disease Father   . Colon cancer Neg Hx    Social History:  reports that she has been smoking Cigarettes.  She has a 15 pack-year smoking history. She has never used smokeless tobacco. She reports that she does not drink alcohol or use illicit drugs.  Allergies:  Allergies  Allergen Reactions  . Demerol [Meperidine] Nausea Only    Medications Prior to Admission  Medication Sig Dispense Refill  . capecitabine (XELODA) 500 MG tablet Take 1,500 mg by mouth 2 (two) times daily after a meal. X 14 days, 7 day rest--      . Multiple Vitamin (MULTIVITAMIN) tablet Take 1 tablet by mouth daily.      . nicotine (NICODERM  CQ - DOSED IN MG/24 HOURS) 21 mg/24hr patch Place 1 patch onto the skin daily.      . ondansetron (ZOFRAN) 8 MG tablet Take 1 tablet (8 mg total) by mouth every 8 (eight) hours as needed for nausea.  20 tablet  0  . oxyCODONE-acetaminophen (PERCOCET/ROXICET) 5-325 MG per tablet Take 1-2 tablets by mouth every 4 (four) hours as needed.  40 tablet  0  . prochlorperazine (COMPAZINE) 10 MG tablet Take 10 mg by mouth every 6 (six) hours as needed (nausea).      . traMADol (ULTRAM) 50 MG tablet Take 1 tablet (50 mg total) by mouth every 6 (six) hours as needed for pain.  30 tablet      No results found for this or any previous visit (from the past 48 hour(s)). No results found.  Review of Systems  Constitutional: Negative for fever and chills.  Eyes: Negative for blurred vision.  Respiratory: Negative for cough.   Cardiovascular: Negative for chest pain.  Gastrointestinal: Negative for nausea, vomiting and abdominal pain.       Pos for pelvic pain  Genitourinary: Negative for dysuria and urgency.  Musculoskeletal: Negative for myalgias.  Neurological: Negative for dizziness and headaches.    Blood pressure 111/66, pulse 67, temperature 96.5 F (35.8 C), temperature source Oral, resp. rate 14, height 5'  3" (1.6 m), weight 98 lb 8 oz (44.679 kg), SpO2 100.00%. Physical Exam  Constitutional: She is oriented to person, place, and time. She appears well-developed and well-nourished.  HENT:  Head: Normocephalic and atraumatic.  Eyes: Conjunctivae are normal. Pupils are equal, round, and reactive to light.  Neck: Normal range of motion. Neck supple.  Cardiovascular: Normal rate and regular rhythm.   Respiratory: Effort normal and breath sounds normal.  GI: Soft. Bowel sounds are normal. She exhibits no distension. There is no tenderness. There is no rebound and no guarding.  Musculoskeletal: Normal range of motion.  Neurological: She is alert and oriented to person, place, and time.  Skin: Skin  is warm and dry.     Assessment/Plan I believe her coloanal anastomosis has broken down.  I would like to evaluate this under anesthesia and drain any infection.  We have discussed this in detail.  The main risks are continued pain, bleeding and damage to adjacent structures.     [name] C. 07/21/2013, 8:50 AM

## 2013-07-21 NOTE — Anesthesia Postprocedure Evaluation (Signed)
Anesthesia Post Note  Patient: Angela Schmidt  Procedure(s) Performed: Procedure(s) (LRB): EXAM UNDER ANESTHESIA/ RECTAL DILITATION (N/A) FLEXIBLE SIGMOIDOSCOPY (N/A)  Anesthesia type: General  Patient location: PACU  Post pain: Pain level controlled  Post assessment: Post-op Vital signs reviewed  Last Vitals: BP 95/53  Pulse 72  Temp(Src) 36.4 C (Oral)  Resp 18  Ht 5\' 3"  (1.6 m)  Wt 98 lb 8 oz (44.679 kg)  BMI 17.45 kg/m2  SpO2 98%  Post vital signs: Reviewed  Level of consciousness: sedated  Complications: No apparent anesthesia complications

## 2013-07-21 NOTE — Anesthesia Preprocedure Evaluation (Signed)
Anesthesia Evaluation  Patient identified by MRN, date of birth, ID band Patient awake    Reviewed: Allergy & Precautions, H&P , NPO status , Patient's Chart, lab work & pertinent test results  History of Anesthesia Complications Negative for: history of anesthetic complications  Airway Mallampati: II TM Distance: >3 FB Neck ROM: Full    Dental  (+) Edentulous Upper and Edentulous Lower   Pulmonary Current Smoker,  Chest CT: moderate to severe emphysema breath sounds clear to auscultation  Pulmonary exam normal       Cardiovascular Exercise Tolerance: Good negative cardio ROS  Rhythm:Regular Rate:Normal  ECG 12-25-12: negative precordial t waves. Probably normal;   Neuro/Psych negative neurological ROS  negative psych ROS   GI/Hepatic negative GI ROS, Neg liver ROS,   Endo/Other  negative endocrine ROS  Renal/GU negative Renal ROS     Musculoskeletal negative musculoskeletal ROS (+)   Abdominal   Peds  Hematology negative hematology ROS (+)   Anesthesia Other Findings   Reproductive/Obstetrics negative OB ROS                           Anesthesia Physical  Anesthesia Plan  ASA: III  Anesthesia Plan: General   Post-op Pain Management:    Induction: Intravenous  Airway Management Planned: Oral ETT  Additional Equipment:   Intra-op Plan:   Post-operative Plan: Extubation in OR  Informed Consent: I have reviewed the patients History and Physical, chart, labs and discussed the procedure including the risks, benefits and alternatives for the proposed anesthesia with the patient or authorized representative who has indicated his/her understanding and acceptance.   Dental advisory given  Plan Discussed with: CRNA  Anesthesia Plan Comments:         Anesthesia Quick Evaluation

## 2013-07-21 NOTE — Anesthesia Procedure Notes (Signed)
Procedure Name: LMA Insertion Date/Time: 07/21/2013 8:58 AM Performed by: Maris Berger T Pre-anesthesia Checklist: Patient identified, Emergency Drugs available, Suction available and Patient being monitored Patient Re-evaluated:Patient Re-evaluated prior to inductionOxygen Delivery Method: Circle System Utilized Preoxygenation: Pre-oxygenation with 100% oxygen Intubation Type: IV induction Ventilation: Mask ventilation without difficulty LMA: LMA inserted LMA Size: 3.0 Number of attempts: 1 Airway Equipment and Method: bite block Placement Confirmation: positive ETCO2 Dental Injury: Teeth and Oropharynx as per pre-operative assessment

## 2013-07-21 NOTE — Op Note (Signed)
07/21/2013  9:53 AM  PATIENT:  Angela Schmidt  52 y.o. female  Patient Care Team: Sheliah Hatch, MD as PCP - General (Family Medicine) Ladene Artist, MD as Consulting Physician (Oncology) Cira Rue, RN as Registered Nurse  PRE-OPERATIVE DIAGNOSIS:  rectal stenosis   POST-OPERATIVE DIAGNOSIS:  rectal stenosis   PROCEDURE:  ANAL EXAM UNDER ANESTHESIA/ RECTAL DILITATION FLEXIBLE SIGMOIDOSCOPY  SURGEON:  Surgeon(s): Romie Levee, MD  ASSISTANT: none   ANESTHESIA:   local and MAC  SPECIMEN:  No Specimen  DISPOSITION OF SPECIMEN:  N/A  COUNTS:  YES  PLAN OF CARE: Discharge to home after PACU  PATIENT DISPOSITION:  PACU - hemodynamically stable.  INDICATION: this is a 52 year old female who is status post low anterior resection and coloanal anastomosis with diverting ileostomy for a low rectal cancer. She is having significant pelvic pain and is here for examination under anesthesia.   OR FINDINGS: posterior wall of coloanal anastomosis has broken down. There is a small cavity noted posteriorly.  DESCRIPTION: the patient was identified in the preoperative holding area and taken to the OR where they were laid on the operating room table.  back anesthesia was induced without difficulty. The patient was then positioned in lithotomy position.  The patient was then prepped and draped in usual sterile fashion.  SCDs were noted to be in place prior to the initiation of anesthesia. A surgical timeout was performed indicating the correct patient, procedure, positioning and need for preoperative antibiotics.  I began with a digital rectal exam.  There were 2 tracts noted one in the left anterior position and one posteriorly. This had closed down quite well since her last exam one week ago.  I then placed a bivalve anoscope into the anal canal and evaluated this completely.  Granulation tissue could be seen posteriorly. I was unable to identify anything other than a small amount  of rectal mucosa. The area was irrigated. A flexible sigmoidoscope was brought onto the field. Both cavities were probed with the sigmoidoscope. The posterior cavity was in fact granulation tissue without signs of undrained abscess.  The left anterior cavity was the rectal wall. I was able to intubate this without difficulty although it was slightly stenosed. I was able to traverse the rectosigmoid junction without difficulty. I then used Bovie electric artery to open up the rectal stricture posteriorly and allow the abscess cavity to drain. I attempted to pack the rectal mucosa to the posterior wall but the granulation tissue would not hold any suture. I then obtained hemostasis using Bovie electrocautery. The cavity was then irrigated. A rectal block was performed using half percent Marcaine with epinephrine in standard fashion. Lidocaine gel was also inserted into the anal canal. The patient was then dressed with sterile dressing and awakened from anesthesia. She was transferred to the postanesthesia care unit stable condition. All counts were correct per operating room staff.

## 2013-07-21 NOTE — Transfer of Care (Signed)
Immediate Anesthesia Transfer of Care Note  Patient: Angela Schmidt  Procedure(s) Performed: Procedure(s): EXAM UNDER ANESTHESIA/ RECTAL DILITATION (N/A) FLEXIBLE SIGMOIDOSCOPY (N/A)  Patient Location: PACU  Anesthesia Type:General  Level of Consciousness: awake, alert  and oriented  Airway & Oxygen Therapy: Patient Spontanous Breathing and Patient connected to nasal cannula oxygen  Post-op Assessment: Report given to PACU RN  Post vital signs: Reviewed and stable  Complications: No apparent anesthesia complications

## 2013-07-22 ENCOUNTER — Encounter (HOSPITAL_BASED_OUTPATIENT_CLINIC_OR_DEPARTMENT_OTHER): Payer: Self-pay | Admitting: General Surgery

## 2013-07-29 ENCOUNTER — Ambulatory Visit (INDEPENDENT_AMBULATORY_CARE_PROVIDER_SITE_OTHER): Payer: BC Managed Care – PPO | Admitting: General Surgery

## 2013-07-29 ENCOUNTER — Encounter (INDEPENDENT_AMBULATORY_CARE_PROVIDER_SITE_OTHER): Payer: Self-pay | Admitting: General Surgery

## 2013-07-29 VITALS — BP 110/62 | HR 78 | Temp 97.7°F | Resp 18 | Wt 100.0 lb

## 2013-07-29 DIAGNOSIS — Z9889 Other specified postprocedural states: Secondary | ICD-10-CM

## 2013-07-29 NOTE — Patient Instructions (Addendum)
Return to the office in 2 weeks.  Continue baths daily to encourage drainage.  Eat a high protein diet

## 2013-07-29 NOTE — Progress Notes (Signed)
Angela Schmidt is a 52 y.o. female who is status post a proctectomy and coloanal anastomosis.  This was complicated by breakdown of the posterior wall of her anastomosis, which was debrided and opened 2 weeks ago.  She is doing pretty well with pain control on narcotics.  She is doing nightly sitz bath.    Objective: Filed Vitals:   07/29/13 1406  BP: 110/62  Pulse: 78  Temp: 97.7 F (36.5 C)  Resp: 18    General appearance: alert and cooperative GI: normal findings: soft, non-tender Ostomy: WNL Incision: posterior cavity about the same size, rectum open without stenosis   Assessment: s/p  Patient Active Problem List   Diagnosis Date Noted  . Rectal cancer 02/25/2013  . Screening for malignant neoplasm of the cervix 12/25/2012  . Routine gynecological examination 12/25/2012  . Diarrhea 12/25/2012    Plan: Continue current management.  High protein diet.  RTO in 2 wks    .Vanita Panda, MD [facility], [place] [phone]   07/29/2013 2:19 PM

## 2013-08-03 ENCOUNTER — Encounter: Payer: Self-pay | Admitting: *Deleted

## 2013-08-03 NOTE — Progress Notes (Signed)
RECEIVED A FAX FROM [employer] CONCERNING A CONFIRMATION OF PRESCRIPTION SHIPMENT FOR XELODA ON 08/02/13.

## 2013-08-04 ENCOUNTER — Ambulatory Visit (HOSPITAL_BASED_OUTPATIENT_CLINIC_OR_DEPARTMENT_OTHER): Payer: BC Managed Care – PPO | Admitting: Nurse Practitioner

## 2013-08-04 ENCOUNTER — Other Ambulatory Visit (HOSPITAL_BASED_OUTPATIENT_CLINIC_OR_DEPARTMENT_OTHER): Payer: BC Managed Care – PPO

## 2013-08-04 ENCOUNTER — Telehealth: Payer: Self-pay | Admitting: Oncology

## 2013-08-04 VITALS — BP 110/70 | HR 83 | Temp 97.8°F | Resp 18 | Ht 63.0 in | Wt 101.7 lb

## 2013-08-04 DIAGNOSIS — C20 Malignant neoplasm of rectum: Secondary | ICD-10-CM

## 2013-08-04 LAB — CBC WITH DIFFERENTIAL/PLATELET
BASO%: 0.6 % (ref 0.0–2.0)
Basophils Absolute: 0 10*3/uL (ref 0.0–0.1)
EOS%: 2.5 % (ref 0.0–7.0)
HGB: 11.2 g/dL — ABNORMAL LOW (ref 11.6–15.9)
LYMPH%: 12 % — ABNORMAL LOW (ref 14.0–49.7)
MCH: 34 pg (ref 25.1–34.0)
MCV: 100.8 fL (ref 79.5–101.0)
MONO%: 10.9 % (ref 0.0–14.0)
Platelets: 394 10*3/uL (ref 145–400)
RBC: 3.28 10*6/uL — ABNORMAL LOW (ref 3.70–5.45)
RDW: 16.5 % — ABNORMAL HIGH (ref 11.2–14.5)
lymph#: 0.6 10*3/uL — ABNORMAL LOW (ref 0.9–3.3)

## 2013-08-04 MED ORDER — ONDANSETRON HCL 8 MG PO TABS: 8.0000 mg | ORAL_TABLET | Freq: Three times a day (TID) | ORAL | Status: DC | PRN

## 2013-08-04 MED ORDER — PROCHLORPERAZINE MALEATE 10 MG PO TABS: 10.0000 mg | ORAL_TABLET | Freq: Four times a day (QID) | ORAL | Status: DC | PRN

## 2013-08-04 NOTE — Telephone Encounter (Signed)
appts made per 12/17 POF AVS and CAL given shh °

## 2013-08-04 NOTE — Progress Notes (Signed)
OFFICE PROGRESS NOTE  Interval history:  Angela Schmidt returns as scheduled. She completed cycle 1 adjuvant Xeloda beginning 07/16/2013. She had mild nausea. No mouth sores. No diarrhea. She denies hand or foot pain or redness. No skin rash.   Objective: Blood pressure 110/70, pulse 83, temperature 97.8 F (36.6 C), temperature source Oral, resp. rate 18, height 5\' 3"  (1.6 m), weight 101 lb 11.2 oz (46.131 kg).  Oropharynx is without thrush or ulceration. Lungs are clear. Regular cardiac rhythm. Abdomen soft and nontender. No hepatomegaly. Right abdomen ileostomy with thick pasty stool in collection bag. No leg edema. Palms are without erythema.  Lab Results: Lab Results  Component Value Date   WBC 5.3 08/04/2013   HGB 11.2* 08/04/2013   HCT 33.1* 08/04/2013   MCV 100.8 08/04/2013   PLT 394 08/04/2013    Chemistry:    Chemistry      Component Value Date/Time   NA 142 07/21/2013 0844   NA 141 04/22/2013 1341   K 3.9 07/21/2013 0844   K 3.8 04/22/2013 1341   CL 103 06/17/2013 0400   CO2 27 06/17/2013 0400   CO2 26 04/22/2013 1341   BUN 5* 06/17/2013 0400   BUN 8.8 04/22/2013 1341   CREATININE 0.82 06/17/2013 0400   CREATININE 0.8 04/22/2013 1341      Component Value Date/Time   CALCIUM 9.0 06/17/2013 0400   CALCIUM 9.0 04/22/2013 1341   ALKPHOS 84 04/22/2013 1341   ALKPHOS 81 02/22/2013 1133   AST 25 04/22/2013 1341   AST 20 02/22/2013 1133   ALT 32 04/22/2013 1341   ALT 19 02/22/2013 1133   BILITOT 0.38 04/22/2013 1341   BILITOT 1.0 02/22/2013 1133       Studies/Results: No results found.  Medications: I have reviewed the patient's current medications.  Assessment/Plan:  1. Rectal cancer, clinical stage III (uT3 uN1) measured at 7 cm from the anal verge.  No evidence for metastatic disease on CTs of the chest, abdomen and pelvis 02/24/2013.  Elevated CEA.  Initiation of neoadjuvant Xeloda and radiation on 03/22/2013, completed 04/29/2013  Status post a low anterior resection and  diverting ileostomy on 06/14/2013-ypT2,ypN0. Cycle 1 adjuvant Xeloda beginning 07/16/2013. 2. "Rheumatoid" arthritis. 3. Tobacco use. 4. Trachea/bronchial nodularity noted on the chest CT 02/24/2013.  Disposition-she appears stable. She tolerated the first cycle of adjuvant Xeloda well. Plan to proceed with cycle 2 as scheduled beginning 08/06/2013. She will return for a followup visit on 08/26/2013. She will contact the office in the interim with any problems.  Plan reviewed with Dr. Truett Perna.  Angela Schmidt ANP/GNP-BC

## 2013-08-05 ENCOUNTER — Encounter (INDEPENDENT_AMBULATORY_CARE_PROVIDER_SITE_OTHER): Payer: BC Managed Care – PPO | Admitting: General Surgery

## 2013-08-10 ENCOUNTER — Encounter (INDEPENDENT_AMBULATORY_CARE_PROVIDER_SITE_OTHER): Payer: BC Managed Care – PPO | Admitting: General Surgery

## 2013-08-16 ENCOUNTER — Other Ambulatory Visit: Payer: BC Managed Care – PPO

## 2013-08-18 ENCOUNTER — Other Ambulatory Visit: Payer: Self-pay | Admitting: *Deleted

## 2013-08-18 DIAGNOSIS — C20 Malignant neoplasm of rectum: Secondary | ICD-10-CM

## 2013-08-18 NOTE — Telephone Encounter (Signed)
THIS REFILL REQUEST FOR XELODA WAS GIVEN TO DR.SHERRILL'S NURSE, SUSAN COWARD,RN.

## 2013-08-23 ENCOUNTER — Telehealth (INDEPENDENT_AMBULATORY_CARE_PROVIDER_SITE_OTHER): Payer: Self-pay

## 2013-08-23 MED ORDER — CAPECITABINE 500 MG PO TABS: 1500.0000 mg | ORAL_TABLET | Freq: Two times a day (BID) | ORAL | Status: DC

## 2013-08-23 NOTE — Telephone Encounter (Signed)
Left message for pt to call me and see if she can move her appointment up tomorrow from 12:30 to 0930

## 2013-08-23 NOTE — Addendum Note (Signed)
Addended by: Wyonia Hough on: 08/23/2013 03:39 PM   Modules accepted: Orders

## 2013-08-24 ENCOUNTER — Ambulatory Visit (INDEPENDENT_AMBULATORY_CARE_PROVIDER_SITE_OTHER): Payer: BC Managed Care – PPO | Admitting: General Surgery

## 2013-08-24 ENCOUNTER — Encounter (INDEPENDENT_AMBULATORY_CARE_PROVIDER_SITE_OTHER): Payer: Self-pay | Admitting: General Surgery

## 2013-08-24 VITALS — BP 100/70 | HR 80 | Temp 97.9°F | Resp 14 | Ht 63.0 in | Wt 99.8 lb

## 2013-08-24 DIAGNOSIS — Z9889 Other specified postprocedural states: Secondary | ICD-10-CM

## 2013-08-24 NOTE — Progress Notes (Signed)
Angela Schmidt is a 53 y.o. female who is status post a proctectomy and coloanal anastomosis. This was complicated by breakdown of the posterior wall of her anastomosis, which was debrided and opened operatively. She is doing well and is off narcotics.  She has occasional pain and drainage.  Objective:  Filed Vitals:   08/24/13 0919  BP: 100/70  Pulse: 80  Temp: 97.9 F (36.6 C)  Resp: 14   General appearance: alert and cooperative  GI: normal findings: soft, non-tender  Ostomy: WNL  Incision: posterior cavity much smaller, minimal rectal stenosis Assessment:  s/p  Patient Active Problem List    Diagnosis  Date Noted   .  Rectal cancer  02/25/2013   .  Screening for malignant neoplasm of the cervix  12/25/2012   .  Routine gynecological examination  12/25/2012   .  Diarrhea  12/25/2012   Plan:  Continue current management. High protein diet. RTO in 4 wks

## 2013-08-24 NOTE — Patient Instructions (Signed)
Return to the office in 3-4 weeks

## 2013-08-25 NOTE — Telephone Encounter (Signed)
Faxed notification from [employer] that Xeloda was shipped on 08/24/13 for next day delivery.

## 2013-08-26 ENCOUNTER — Other Ambulatory Visit (HOSPITAL_BASED_OUTPATIENT_CLINIC_OR_DEPARTMENT_OTHER): Payer: BC Managed Care – PPO

## 2013-08-26 ENCOUNTER — Ambulatory Visit (HOSPITAL_BASED_OUTPATIENT_CLINIC_OR_DEPARTMENT_OTHER): Payer: BC Managed Care – PPO | Admitting: Nurse Practitioner

## 2013-08-26 VITALS — BP 108/71 | HR 81 | Temp 97.6°F | Ht 63.0 in | Wt 102.6 lb

## 2013-08-26 DIAGNOSIS — J988 Other specified respiratory disorders: Secondary | ICD-10-CM

## 2013-08-26 DIAGNOSIS — F17200 Nicotine dependence, unspecified, uncomplicated: Secondary | ICD-10-CM

## 2013-08-26 DIAGNOSIS — C20 Malignant neoplasm of rectum: Secondary | ICD-10-CM

## 2013-08-26 DIAGNOSIS — M069 Rheumatoid arthritis, unspecified: Secondary | ICD-10-CM

## 2013-08-26 DIAGNOSIS — J398 Other specified diseases of upper respiratory tract: Secondary | ICD-10-CM

## 2013-08-26 LAB — CBC WITH DIFFERENTIAL/PLATELET
BASO%: 0.5 % (ref 0.0–2.0)
BASOS ABS: 0 10*3/uL (ref 0.0–0.1)
EOS%: 4.9 % (ref 0.0–7.0)
Eosinophils Absolute: 0.2 10*3/uL (ref 0.0–0.5)
HEMATOCRIT: 34.7 % — AB (ref 34.8–46.6)
HEMOGLOBIN: 11.7 g/dL (ref 11.6–15.9)
LYMPH%: 15.7 % (ref 14.0–49.7)
MCH: 34.8 pg — AB (ref 25.1–34.0)
MCHC: 33.6 g/dL (ref 31.5–36.0)
MCV: 103.5 fL — ABNORMAL HIGH (ref 79.5–101.0)
MONO#: 0.5 10*3/uL (ref 0.1–0.9)
MONO%: 13.4 % (ref 0.0–14.0)
NEUT#: 2.7 10*3/uL (ref 1.5–6.5)
NEUT%: 65.5 % (ref 38.4–76.8)
Platelets: 331 10*3/uL (ref 145–400)
RBC: 3.35 10*6/uL — ABNORMAL LOW (ref 3.70–5.45)
RDW: 24.2 % — ABNORMAL HIGH (ref 11.2–14.5)
WBC: 4.1 10*3/uL (ref 3.9–10.3)
lymph#: 0.6 10*3/uL — ABNORMAL LOW (ref 0.9–3.3)

## 2013-08-26 LAB — COMPREHENSIVE METABOLIC PANEL (CC13)
ALT: 18 U/L (ref 0–55)
AST: 18 U/L (ref 5–34)
Albumin: 3.5 g/dL (ref 3.5–5.0)
Alkaline Phosphatase: 111 U/L (ref 40–150)
Anion Gap: 11 mEq/L (ref 3–11)
BUN: 10.1 mg/dL (ref 7.0–26.0)
CALCIUM: 9.5 mg/dL (ref 8.4–10.4)
CHLORIDE: 106 meq/L (ref 98–109)
CO2: 24 mEq/L (ref 22–29)
Creatinine: 0.8 mg/dL (ref 0.6–1.1)
Glucose: 98 mg/dl (ref 70–140)
Potassium: 4 mEq/L (ref 3.5–5.1)
Sodium: 141 mEq/L (ref 136–145)
Total Bilirubin: 0.38 mg/dL (ref 0.20–1.20)
Total Protein: 6.9 g/dL (ref 6.4–8.3)

## 2013-08-26 NOTE — Progress Notes (Signed)
OFFICE PROGRESS NOTE  Interval history:  Angela Schmidt returns as scheduled. She completed cycle 2 adjuvant Xeloda beginning 08/06/2013. She had intermittent mild nausea. She took Compazine and Zofran as needed. No vomiting. She denies mouth sores. She does note that her tongue intermittently feels sore. No significant diarrhea. Ileostomy output is typically "pasty and thick". The output is occasionally watery. She denies hand or foot pain or redness. She has a "paper cut" on the right thumb.   Objective: Filed Vitals:   08/26/13 1209  BP: 108/71  Pulse: 81  Temp: 97.6 F (36.4 C)   Oropharynx is without thrush or ulceration. Lungs are clear. Regular cardiac rhythm. Abdomen is soft and nontender. No hepatomegaly. Right abdomen ileostomy. No leg edema. Palms without erythema. Small linear break in the skin at the right thumb.   Lab Results: Lab Results  Component Value Date   WBC 4.1 08/26/2013   HGB 11.7 08/26/2013   HCT 34.7* 08/26/2013   MCV 103.5* 08/26/2013   PLT 331 08/26/2013   NEUTROABS 2.7 08/26/2013    Chemistry:    Chemistry      Component Value Date/Time   NA 141 08/26/2013 1124   NA 142 07/21/2013 0844   K 4.0 08/26/2013 1124   K 3.9 07/21/2013 0844   CL 103 06/17/2013 0400   CO2 24 08/26/2013 1124   CO2 27 06/17/2013 0400   BUN 10.1 08/26/2013 1124   BUN 5* 06/17/2013 0400   CREATININE 0.8 08/26/2013 1124   CREATININE 0.82 06/17/2013 0400      Component Value Date/Time   CALCIUM 9.5 08/26/2013 1124   CALCIUM 9.0 06/17/2013 0400   ALKPHOS 111 08/26/2013 1124   ALKPHOS 81 02/22/2013 1133   AST 18 08/26/2013 1124   AST 20 02/22/2013 1133   ALT 18 08/26/2013 1124   ALT 19 02/22/2013 1133   BILITOT 0.38 08/26/2013 1124   BILITOT 1.0 02/22/2013 1133       Studies/Results: No results found.  Medications: I have reviewed the patient's current medications.  Assessment/Plan: 1. Rectal cancer, clinical stage III (uT3 uN1) measured at 7 cm from the anal verge.  No evidence for metastatic  disease on CTs of the chest, abdomen and pelvis 02/24/2013.  Elevated CEA.  Initiation of neoadjuvant Xeloda and radiation on 03/22/2013, completed 04/29/2013  Status post a low anterior resection and diverting ileostomy on 06/14/2013-ypT2,ypN0.  Cycle 1 adjuvant Xeloda beginning 07/16/2013. Cycle 2 adjuvant Xeloda beginning 08/06/2013. 2. "Rheumatoid" arthritis. 3. Tobacco use. 4. Trachea/bronchial nodularity noted on the chest CT 02/24/2013.  Dispositon-she appears stable. She has completed 2 cycles of adjuvant Xeloda. Plan to proceed with cycle 3 as scheduled 08/27/2013. She will return for a followup visit on 09/16/2013. She will contact the office in the interim with any problems. We specifically discussed mouth sores, poorly controlled nausea, hand or foot pain or redness.   Ned Card ANP/GNP-BC

## 2013-08-27 LAB — CEA: CEA: 1.1 ng/mL (ref 0.0–5.0)

## 2013-08-31 ENCOUNTER — Telehealth (INDEPENDENT_AMBULATORY_CARE_PROVIDER_SITE_OTHER): Payer: Self-pay | Admitting: General Surgery

## 2013-08-31 NOTE — Telephone Encounter (Signed)
Patient called and want to know about the test that Dr Marcello Moores was going to order reg her rectal mass. I called back and talked to Dr Marcello Moores and she stated that she had sent everything to the pathology and it does take awhile and get a answer back from the pathology and once she does she will call the patient and go from there. And patient understand that and will wait from Dr Marcello Moores office to let her know what she will need to do next

## 2013-09-16 ENCOUNTER — Telehealth: Payer: Self-pay | Admitting: *Deleted

## 2013-09-16 ENCOUNTER — Telehealth: Payer: Self-pay | Admitting: Oncology

## 2013-09-16 ENCOUNTER — Other Ambulatory Visit: Payer: BC Managed Care – PPO

## 2013-09-16 ENCOUNTER — Ambulatory Visit: Payer: BC Managed Care – PPO | Admitting: Oncology

## 2013-09-16 NOTE — Telephone Encounter (Signed)
Called patient to follow up on [place] of today. She reports she called and left VM with scheduler yesterday that she could not come. Needs to reschedule. Told her scheduler will call her tomorrow. She completed her last chemo cycle without problem.

## 2013-09-16 NOTE — Telephone Encounter (Signed)
talked to pt a nd she is aware of appt for lab and ML tomorrow

## 2013-09-17 ENCOUNTER — Other Ambulatory Visit (HOSPITAL_BASED_OUTPATIENT_CLINIC_OR_DEPARTMENT_OTHER): Payer: BC Managed Care – PPO

## 2013-09-17 ENCOUNTER — Encounter: Payer: Self-pay | Admitting: *Deleted

## 2013-09-17 ENCOUNTER — Ambulatory Visit (HOSPITAL_BASED_OUTPATIENT_CLINIC_OR_DEPARTMENT_OTHER): Payer: BC Managed Care – PPO | Admitting: Nurse Practitioner

## 2013-09-17 VITALS — BP 100/69 | HR 93 | Temp 97.5°F | Resp 18 | Ht 63.0 in | Wt 106.1 lb

## 2013-09-17 DIAGNOSIS — C20 Malignant neoplasm of rectum: Secondary | ICD-10-CM

## 2013-09-17 DIAGNOSIS — L270 Generalized skin eruption due to drugs and medicaments taken internally: Secondary | ICD-10-CM

## 2013-09-17 DIAGNOSIS — R110 Nausea: Secondary | ICD-10-CM

## 2013-09-17 DIAGNOSIS — F17200 Nicotine dependence, unspecified, uncomplicated: Secondary | ICD-10-CM

## 2013-09-17 LAB — CBC WITH DIFFERENTIAL/PLATELET
BASO%: 1 % (ref 0.0–2.0)
Basophils Absolute: 0 10*3/uL (ref 0.0–0.1)
EOS ABS: 0.3 10*3/uL (ref 0.0–0.5)
EOS%: 8.7 % — ABNORMAL HIGH (ref 0.0–7.0)
HCT: 38.8 % (ref 34.8–46.6)
HGB: 13 g/dL (ref 11.6–15.9)
LYMPH%: 19 % (ref 14.0–49.7)
MCH: 35.6 pg — ABNORMAL HIGH (ref 25.1–34.0)
MCHC: 33.5 g/dL (ref 31.5–36.0)
MCV: 106.1 fL — ABNORMAL HIGH (ref 79.5–101.0)
MONO#: 0.6 10*3/uL (ref 0.1–0.9)
MONO%: 17.9 % — ABNORMAL HIGH (ref 0.0–14.0)
NEUT%: 53.4 % (ref 38.4–76.8)
NEUTROS ABS: 1.7 10*3/uL (ref 1.5–6.5)
Platelets: 281 10*3/uL (ref 145–400)
RBC: 3.66 10*6/uL — ABNORMAL LOW (ref 3.70–5.45)
RDW: 25.9 % — ABNORMAL HIGH (ref 11.2–14.5)
WBC: 3.3 10*3/uL — ABNORMAL LOW (ref 3.9–10.3)
lymph#: 0.6 10*3/uL — ABNORMAL LOW (ref 0.9–3.3)

## 2013-09-17 LAB — COMPREHENSIVE METABOLIC PANEL (CC13)
ALBUMIN: 3.8 g/dL (ref 3.5–5.0)
ALK PHOS: 108 U/L (ref 40–150)
ALT: 27 U/L (ref 0–55)
AST: 24 U/L (ref 5–34)
Anion Gap: 10 mEq/L (ref 3–11)
BUN: 13.1 mg/dL (ref 7.0–26.0)
CO2: 25 mEq/L (ref 22–29)
Calcium: 9.9 mg/dL (ref 8.4–10.4)
Chloride: 106 mEq/L (ref 98–109)
Creatinine: 0.9 mg/dL (ref 0.6–1.1)
GLUCOSE: 74 mg/dL (ref 70–140)
POTASSIUM: 4.1 meq/L (ref 3.5–5.1)
Sodium: 141 mEq/L (ref 136–145)
TOTAL PROTEIN: 7.3 g/dL (ref 6.4–8.3)
Total Bilirubin: 0.45 mg/dL (ref 0.20–1.20)

## 2013-09-17 NOTE — Progress Notes (Signed)
OFFICE PROGRESS NOTE  Interval history:  Ms. Piechocki returns for followup of rectal cancer. She began cycle 4 adjuvant Xeloda today. She continues to have intermittent mild nausea. She takes Compazine and Zofran as needed. She notes that peppermint relieves the nausea as well. Her mouth became sore with the most recent cycle. She did not develop any ulcerations. No diarrhea. She notes her hands are dry. No pain or redness of the hands or feet.   Objective: Filed Vitals:   09/17/13 1002  BP: 100/69  Pulse: 93  Temp: 97.5 F (36.4 C)  Resp: 18   Oropharynx is without thrush or ulceration. Lungs are clear. Regular cardiac rhythm. Abdomen soft and nontender. No hepatomegaly. Right abdomen ileostomy. No leg edema. Palms with mild erythema, dry appearance. No skin breakdown.   Lab Results: Lab Results  Component Value Date   WBC 3.3* 09/17/2013   HGB 13.0 09/17/2013   HCT 38.8 09/17/2013   MCV 106.1* 09/17/2013   PLT 281 09/17/2013   NEUTROABS 1.7 09/17/2013    Chemistry:    Chemistry      Component Value Date/Time   NA 141 08/26/2013 1124   NA 142 07/21/2013 0844   K 4.0 08/26/2013 1124   K 3.9 07/21/2013 0844   CL 103 06/17/2013 0400   CO2 24 08/26/2013 1124   CO2 27 06/17/2013 0400   BUN 10.1 08/26/2013 1124   BUN 5* 06/17/2013 0400   CREATININE 0.8 08/26/2013 1124   CREATININE 0.82 06/17/2013 0400      Component Value Date/Time   CALCIUM 9.5 08/26/2013 1124   CALCIUM 9.0 06/17/2013 0400   ALKPHOS 111 08/26/2013 1124   ALKPHOS 81 02/22/2013 1133   AST 18 08/26/2013 1124   AST 20 02/22/2013 1133   ALT 18 08/26/2013 1124   ALT 19 02/22/2013 1133   BILITOT 0.38 08/26/2013 1124   BILITOT 1.0 02/22/2013 1133       Studies/Results: No results found.  Medications: I have reviewed the patient's current medications.  Assessment/Plan: 1. Rectal cancer, clinical stage III (uT3 uN1) measured at 7 cm from the anal verge.  No evidence for metastatic disease on CTs of the chest, abdomen and pelvis  02/24/2013.  Elevated CEA.  Initiation of neoadjuvant Xeloda and radiation on 03/22/2013, completed 04/29/2013  Status post a low anterior resection and diverting ileostomy on 06/14/2013-ypT2,ypN0.  Cycle 1 adjuvant Xeloda beginning 07/16/2013.  Cycle 2 adjuvant Xeloda beginning 08/06/2013. Cycle 3 adjuvant Xeloda beginning 08/27/2013. Cycle 4 adjuvant Xeloda beginning 09/17/2013. 2. "Rheumatoid" arthritis. 3. Tobacco use. 4. Trachea/bronchial nodularity noted on the chest CT 02/24/2013. 5. Hand-foot syndrome with mild erythema and dryness over the palms.   Dispositon-she appears stable. She is currently completing cycle 4 adjuvant Xeloda. She has mild hand-foot syndrome. She understands to stop the Xeloda and contact the office if symptoms worsen. Otherwise she will return for a followup visit on 10/01/2013.  Plan reviewed with Dr. Benay Spice.   Ned Card ANP/GNP-BC

## 2013-09-17 NOTE — Progress Notes (Signed)
RECEIVED A FAX FROM [employer] CONCERNING A CONFIRMATION OF PRESCRIPTION SHIPMENT FOR XELODA ON 09/16/13.

## 2013-09-20 ENCOUNTER — Ambulatory Visit (INDEPENDENT_AMBULATORY_CARE_PROVIDER_SITE_OTHER): Payer: BC Managed Care – PPO | Admitting: General Surgery

## 2013-09-20 ENCOUNTER — Encounter (INDEPENDENT_AMBULATORY_CARE_PROVIDER_SITE_OTHER): Payer: Self-pay | Admitting: General Surgery

## 2013-09-20 VITALS — BP 100/70 | HR 90 | Temp 97.2°F | Wt 107.4 lb

## 2013-09-20 DIAGNOSIS — C20 Malignant neoplasm of rectum: Secondary | ICD-10-CM

## 2013-09-20 NOTE — Patient Instructions (Signed)
Return to the office in 3-4 weeks 

## 2013-09-20 NOTE — Progress Notes (Signed)
Angela Schmidt is a 53 y.o. female who is status post a proctectomy and coloanal anastomosis. This was complicated by breakdown of the posterior wall of her anastomosis, which was debrided and opened operatively. She is doing well and reports minimal pain and draiange. Objective:  Filed Vitals:   09/20/13 0936  BP: 100/70  Pulse: 90  Temp: 97.2 F (36.2 C)   General appearance: alert and cooperative  GI: normal findings: soft, non-tender  Ostomy: WNL  Incision: posterior cavity healed, minimal rectal stenosis, able to pass my index finger   Assessment:  s/p  Patient Active Problem List    Diagnosis  Date Noted   .  Rectal cancer  02/25/2013   .  Screening for malignant neoplasm of the cervix  12/25/2012   .  Routine gynecological examination  12/25/2012   .  Diarrhea  12/25/2012   Plan:  Continue current management. She is ready for ostomy takedown.  We will wait until her adjuvant chemo is complete.  I will see her back in 4 weeks to schedule surgery.

## 2013-09-30 ENCOUNTER — Other Ambulatory Visit: Payer: Self-pay | Admitting: *Deleted

## 2013-09-30 DIAGNOSIS — C20 Malignant neoplasm of rectum: Secondary | ICD-10-CM

## 2013-09-30 MED ORDER — CAPECITABINE 500 MG PO TABS: 1500.0000 mg | ORAL_TABLET | Freq: Two times a day (BID) | ORAL | Status: DC

## 2013-09-30 NOTE — Addendum Note (Signed)
Addended by: Wyonia Hough on: 09/30/2013 12:28 PM   Modules accepted: Orders

## 2013-09-30 NOTE — Telephone Encounter (Signed)
THIS REFILL REQUEST FOR CAPECITABINE/XELODA WAS GIVEN TO DR.SHERRILL'S NURSE, SUSAN COWARD,RN.

## 2013-10-01 ENCOUNTER — Telehealth: Payer: Self-pay | Admitting: Oncology

## 2013-10-01 ENCOUNTER — Ambulatory Visit (HOSPITAL_BASED_OUTPATIENT_CLINIC_OR_DEPARTMENT_OTHER): Payer: BC Managed Care – PPO | Admitting: Oncology

## 2013-10-01 ENCOUNTER — Other Ambulatory Visit (HOSPITAL_BASED_OUTPATIENT_CLINIC_OR_DEPARTMENT_OTHER): Payer: BC Managed Care – PPO

## 2013-10-01 VITALS — BP 110/68 | HR 82 | Temp 97.5°F | Resp 18 | Ht 63.0 in | Wt 108.7 lb

## 2013-10-01 DIAGNOSIS — C20 Malignant neoplasm of rectum: Secondary | ICD-10-CM

## 2013-10-01 DIAGNOSIS — L270 Generalized skin eruption due to drugs and medicaments taken internally: Secondary | ICD-10-CM

## 2013-10-01 DIAGNOSIS — J398 Other specified diseases of upper respiratory tract: Secondary | ICD-10-CM

## 2013-10-01 DIAGNOSIS — J988 Other specified respiratory disorders: Secondary | ICD-10-CM

## 2013-10-01 DIAGNOSIS — M069 Rheumatoid arthritis, unspecified: Secondary | ICD-10-CM

## 2013-10-01 DIAGNOSIS — F17200 Nicotine dependence, unspecified, uncomplicated: Secondary | ICD-10-CM

## 2013-10-01 LAB — COMPREHENSIVE METABOLIC PANEL (CC13)
ALK PHOS: 94 U/L (ref 40–150)
ALT: 32 U/L (ref 0–55)
AST: 27 U/L (ref 5–34)
Albumin: 3.9 g/dL (ref 3.5–5.0)
Anion Gap: 11 mEq/L (ref 3–11)
BILIRUBIN TOTAL: 0.88 mg/dL (ref 0.20–1.20)
BUN: 13.1 mg/dL (ref 7.0–26.0)
CO2: 24 meq/L (ref 22–29)
Calcium: 10.3 mg/dL (ref 8.4–10.4)
Chloride: 106 mEq/L (ref 98–109)
Creatinine: 1 mg/dL (ref 0.6–1.1)
Glucose: 77 mg/dl (ref 70–140)
Potassium: 3.9 mEq/L (ref 3.5–5.1)
SODIUM: 141 meq/L (ref 136–145)
TOTAL PROTEIN: 7.3 g/dL (ref 6.4–8.3)

## 2013-10-01 LAB — CBC WITH DIFFERENTIAL/PLATELET
BASO%: 0.7 % (ref 0.0–2.0)
Basophils Absolute: 0 10*3/uL (ref 0.0–0.1)
EOS%: 9.6 % — AB (ref 0.0–7.0)
Eosinophils Absolute: 0.3 10*3/uL (ref 0.0–0.5)
HCT: 39.5 % (ref 34.8–46.6)
HGB: 13.4 g/dL (ref 11.6–15.9)
LYMPH%: 16.3 % (ref 14.0–49.7)
MCH: 35.4 pg — AB (ref 25.1–34.0)
MCHC: 33.9 g/dL (ref 31.5–36.0)
MCV: 104.5 fL — ABNORMAL HIGH (ref 79.5–101.0)
MONO#: 0.5 10*3/uL (ref 0.1–0.9)
MONO%: 16.9 % — AB (ref 0.0–14.0)
NEUT#: 1.7 10*3/uL (ref 1.5–6.5)
NEUT%: 56.5 % (ref 38.4–76.8)
PLATELETS: 237 10*3/uL (ref 145–400)
RBC: 3.78 10*6/uL (ref 3.70–5.45)
RDW: 22 % — ABNORMAL HIGH (ref 11.2–14.5)
WBC: 3 10*3/uL — AB (ref 3.9–10.3)
lymph#: 0.5 10*3/uL — ABNORMAL LOW (ref 0.9–3.3)

## 2013-10-01 NOTE — Progress Notes (Signed)
   [facility]    OFFICE PROGRESS NOTE   INTERVAL HISTORY:   She returns for scheduled followup of rectal cancer. She completed another cycle Xeloda yesterday. No mouth sores, hand/foot pain, or diarrhea. She empties the ileostomy bag 4-5 times per day. She has mild nausea while on Xeloda. This is relieved with "peppermint ".  Objective:  Vital signs in last 24 hours:  Blood pressure 110/68, pulse 82, temperature 97.5 F (36.4 C), temperature source Oral, resp. rate 18, height 5\' 3"  (1.6 m), weight 108 lb 11.2 oz (49.306 kg), SpO2 100.00%.    HEENT: No thrush or ulcers Resp: Scattered fine rales on inspiration, no respiratory distress Cardio: Regular rate and rhythm GI: No hepatomegaly, right lower quadrant ileostomy, nontender Vascular: No leg edema    Portacath/PICC-without erythema  Lab Results:  Lab Results  Component Value Date   WBC 3.0* 10/01/2013   HGB 13.4 10/01/2013   HCT 39.5 10/01/2013   MCV 104.5* 10/01/2013   PLT 237 10/01/2013   NEUTROABS 1.7 10/01/2013   potassium 3.9, creatinine 1.0    Medications: I have reviewed the patient's current medications.  Assessment/Plan: 1. Rectal cancer, clinical stage III (uT3 uN1) measured at 7 cm from the anal verge.  No evidence for metastatic disease on CTs of the chest, abdomen and pelvis 02/24/2013.  Elevated CEA.  Initiation of neoadjuvant Xeloda and radiation on 03/22/2013, completed 04/29/2013  Status post a low anterior resection and diverting ileostomy on 06/14/2013-ypT2,ypN0.  Cycle 1 adjuvant Xeloda beginning 07/16/2013.  Cycle 2 adjuvant Xeloda beginning 08/06/2013.  Cycle 3 adjuvant Xeloda beginning 08/27/2013.  Cycle 4 adjuvant Xeloda beginning 09/17/2013. 2. "Rheumatoid" arthritis. 3. Tobacco use. 4. Trachea/bronchial nodularity noted on the chest CT 02/24/2013. 5. Hand-foot syndrome with dryness over the palms.   Disposition:  She continues to tolerate the Xeloda well. She will  complete a fifth and final planned cycle of adjuvant Xeloda beginning 10/08/2013. Ms. Debarge will return for an office visit on 11/04/2013. I encouraged her to push oral hydration. She will see Dr. Marcello Moores in the near future to plan the ostomy takedown.   Betsy Coder, MD  10/01/2013  9:20 AM

## 2013-10-01 NOTE — Telephone Encounter (Signed)
Gave pt appt for lab and ML for march 2015

## 2013-10-07 NOTE — Telephone Encounter (Signed)
RECEIVED A FAX FROM [employer] CONCERNING A CONFIRMATION OF PRESCRIPTION SHIPMENT FOR XELODA ON 10/06/13.

## 2013-10-21 ENCOUNTER — Other Ambulatory Visit: Payer: Self-pay | Admitting: *Deleted

## 2013-10-21 NOTE — Telephone Encounter (Signed)
Denied Xeloda refill request. Pt has completed therapy.

## 2013-10-21 NOTE — Telephone Encounter (Signed)
THIS REFILL REQUEST FOR XELODA WAS GIVEN TO DR.SHERRILL'S NURSE, TANYA WHITLOCK,RN.

## 2013-10-25 ENCOUNTER — Telehealth (INDEPENDENT_AMBULATORY_CARE_PROVIDER_SITE_OTHER): Payer: Self-pay | Admitting: General Surgery

## 2013-10-25 ENCOUNTER — Encounter (INDEPENDENT_AMBULATORY_CARE_PROVIDER_SITE_OTHER): Payer: Self-pay | Admitting: General Surgery

## 2013-10-25 ENCOUNTER — Ambulatory Visit (INDEPENDENT_AMBULATORY_CARE_PROVIDER_SITE_OTHER): Payer: BC Managed Care – PPO | Admitting: General Surgery

## 2013-10-25 VITALS — BP 120/76 | HR 62 | Temp 98.0°F | Resp 18 | Ht 63.0 in | Wt 111.6 lb

## 2013-10-25 DIAGNOSIS — C20 Malignant neoplasm of rectum: Secondary | ICD-10-CM

## 2013-10-25 NOTE — Progress Notes (Signed)
Angela Schmidt is a 53 y.o. female who is status post a proctectomy and coloanal anastomosis. This was complicated by breakdown of the posterior wall of her anastomosis, which was debrided and opened operatively. She is doing well and reports no pain and draiange. She has just completed her adjuvant chemotherapy last week.  She denies any issues with her ileostomy. Past Medical History  Diagnosis Date  . History of kidney stones   . Complication of anesthesia     "shivers"  . Rectal stenosis   . S/P ileostomy   . Cancer of rectosigmoid (colon) stage III (uT3  uN1)  radiation complete 04-29-2013  currently on oral chemo med---  oncologist-- dr Benay Spice    06-14-2013  s/p anterior resection w/ Diverting ileostomy---  . Arthritis     LUMBAR  . Wears contact lenses    Past Surgical History  Procedure Laterality Date  . Bunionectomy Left 1984  . Eus N/A 02/25/2013    Procedure: LOWER ENDOSCOPIC ULTRASOUND (EUS);  Surgeon: Milus Banister, MD;  Location: [facility];  Service: Endoscopy;  Laterality: N/A;  radial  . Laparoscopic low anterior resection N/A 06/14/2013    Procedure: LAPAROSCOPIC LOW ANTERIOR RESECTION AND DIVERTING LAPAROSCOPIC ILEOSTOMY;  Surgeon: Leighton Ruff, MD;  Location: [facility];  Service: General;  Laterality: N/A;  with splenic flexure takdown  . Tubal ligation  1994  . Examination under anesthesia N/A 07/21/2013    Procedure: EXAM UNDER ANESTHESIA/ RECTAL DILITATION;  Surgeon: Leighton Ruff, MD;  Location[facility];  Service: General;  Laterality: N/A;  . Flexible sigmoidoscopy N/A 07/21/2013    Procedure: FLEXIBLE SIGMOIDOSCOPY;  Surgeon: Leighton Ruff, MD;  Location: [facility];  Service: General;  Laterality: N/A;   Current outpatient prescriptions:ibuprofen (ADVIL,MOTRIN) 200 MG tablet, Take 400 mg by mouth every 6 (six) hours as needed., Disp: , Rfl: ;  Multiple Vitamin (MULTIVITAMIN) tablet, Take 1 tablet by mouth daily., Disp: ,  Rfl: ;  nicotine (NICODERM CQ - DOSED IN MG/24 HOURS) 21 mg/24hr patch, Place 1 patch onto the skin daily., Disp: , Rfl:  ondansetron (ZOFRAN) 8 MG tablet, Take 1 tablet (8 mg total) by mouth every 8 (eight) hours as needed for nausea., Disp: 20 tablet, Rfl: 2;  prochlorperazine (COMPAZINE) 10 MG tablet, Take 1 tablet (10 mg total) by mouth every 6 (six) hours as needed (nausea)., Disp: 40 tablet, Rfl: 3;  traMADol (ULTRAM) 50 MG tablet, Take 1 tablet (50 mg total) by mouth every 6 (six) hours as needed for pain., Disp: 30 tablet, Rfl:  capecitabine (XELODA) 500 MG tablet, Take 3 tablets (1,500 mg total) by mouth 2 (two) times daily after a meal. X 14 days, 7 day rest--, Disp: 84 tablet, Rfl: 0 Allergies  Allergen Reactions  . Demerol [Meperidine] Nausea Only   Family History  Problem Relation Age of Onset  . Arthritis Mother   . Diabetes Mother   . Thyroid disease Mother   . Arthritis Father   . Diabetes Father   . Parkinson's disease Father   . Colon cancer Neg Hx    History   Social History  . Marital Status: Divorced    Spouse Name: N/A    Number of Children: 3  . Years of Education: N/A   Occupational History  . Not on file.   Social History Main Topics  . Smoking status: Current Every Day Smoker -- 0.50 packs/day for 30 years    Types: Cigarettes  . Smokeless tobacco: Never  Used     Comment: using Nicotine patch  . Alcohol Use: No  . Drug Use: No  . Sexual Activity: Not on file   Other Topics Concern  . Not on file   Social History Narrative  . No narrative on file   Review of Systems - General ROS: negative for - chills or fever Respiratory ROS: no cough, shortness of breath, or wheezing Cardiovascular ROS: no chest pain or dyspnea on exertion Gastrointestinal ROS: no abdominal pain, change in bowel habits, or black or bloody stools Genito-Urinary ROS: no dysuria, trouble voiding, or hematuria Objective: Filed Vitals:   10/25/13 1026  BP: 120/76  Pulse: 62   Temp: 98 F (36.7 C)  Resp: 18    General appearance: alert and cooperative Resp: clear to auscultation bilaterally Cardio: regular rate and rhythm GI: normal findings: soft, non-tender Anal: Slight rectal stenosis  Assessment and Plan: Angela Schmidt Is a 53 year old female who is status post low anterior resection with coloanal anastomosis. This was complicated by breakdown of the posterior wall. This is healed under secondary intention. She has completed her adjuvant chemotherapy.  She now is ready for Ileostomy closure. I will do a flexible sigmoidoscope at the time of the OR to confirm rectal patency.  We discussed the risk and benefits of the surgery. We also discussed the changes in bowel habits that she will likely noticed after surgery including urgency and leakage. We discussed that these usually get better over time.  We also discussed ways that she can minimize problems. All questions were answered. I believe she understands the risk of surgery and has agreed to proceed.    Rosario Adie, MD [facility], [place]

## 2013-10-25 NOTE — Telephone Encounter (Signed)
Patient met with surgery scheduling went over financial responsibilities and will call back when ready to schedule °

## 2013-10-25 NOTE — Patient Instructions (Signed)
We will schedule you for surgery.  You do not need any bowel prep before surgery.

## 2013-11-04 ENCOUNTER — Ambulatory Visit (HOSPITAL_BASED_OUTPATIENT_CLINIC_OR_DEPARTMENT_OTHER): Payer: BC Managed Care – PPO | Admitting: Nurse Practitioner

## 2013-11-04 ENCOUNTER — Other Ambulatory Visit (HOSPITAL_BASED_OUTPATIENT_CLINIC_OR_DEPARTMENT_OTHER): Payer: BC Managed Care – PPO

## 2013-11-04 ENCOUNTER — Telehealth: Payer: Self-pay | Admitting: Oncology

## 2013-11-04 VITALS — BP 119/70 | HR 83 | Temp 97.4°F | Resp 18 | Ht 63.0 in | Wt 113.5 lb

## 2013-11-04 DIAGNOSIS — C20 Malignant neoplasm of rectum: Secondary | ICD-10-CM

## 2013-11-04 DIAGNOSIS — M069 Rheumatoid arthritis, unspecified: Secondary | ICD-10-CM

## 2013-11-04 DIAGNOSIS — F17200 Nicotine dependence, unspecified, uncomplicated: Secondary | ICD-10-CM

## 2013-11-04 LAB — CBC WITH DIFFERENTIAL/PLATELET
BASO%: 1.2 % (ref 0.0–2.0)
Basophils Absolute: 0 10*3/uL (ref 0.0–0.1)
EOS%: 7.8 % — AB (ref 0.0–7.0)
Eosinophils Absolute: 0.3 10*3/uL (ref 0.0–0.5)
HCT: 39.4 % (ref 34.8–46.6)
HGB: 13.3 g/dL (ref 11.6–15.9)
LYMPH%: 20.5 % (ref 14.0–49.7)
MCH: 37.6 pg — AB (ref 25.1–34.0)
MCHC: 33.9 g/dL (ref 31.5–36.0)
MCV: 110.9 fL — AB (ref 79.5–101.0)
MONO#: 0.5 10*3/uL (ref 0.1–0.9)
MONO%: 16.8 % — ABNORMAL HIGH (ref 0.0–14.0)
NEUT#: 1.7 10*3/uL (ref 1.5–6.5)
NEUT%: 53.7 % (ref 38.4–76.8)
Platelets: 263 10*3/uL (ref 145–400)
RBC: 3.55 10*6/uL — ABNORMAL LOW (ref 3.70–5.45)
RDW: 19 % — ABNORMAL HIGH (ref 11.2–14.5)
WBC: 3.2 10*3/uL — ABNORMAL LOW (ref 3.9–10.3)
lymph#: 0.7 10*3/uL — ABNORMAL LOW (ref 0.9–3.3)

## 2013-11-04 NOTE — Telephone Encounter (Signed)
Gave pt appt for lab ,CT and MD , pt will drink oral contrast  @ radiology, Dr Carlean Purl GI @ [place] will send pt a letter before appt ,template not ready, pt is aware also

## 2013-11-04 NOTE — Progress Notes (Signed)
OFFICE PROGRESS NOTE  Interval history:  Angela Schmidt returns for scheduled followup of rectal cancer. She completed the fifth and final planned cycle of adjuvant Xeloda beginning 10/08/2013. She has mild intermittent nausea. No diarrhea. No mouth sores. She notes her hands are dry. No hand or foot pain. She intermittently feels lightheaded. Arms and hands feel "weak" at times. She denies any neck pain. She remains very active. She has a good appetite. She is gaining weight.   Objective: Filed Vitals:   11/04/13 0958  BP: 119/70  Pulse: 83  Temp: 97.4 F (36.3 C)  Resp: 18   No thrush or ulcerations. No palpable cervical, supraclavicular, axillary or inguinal lymph nodes. Lungs are clear. No wheezes or rales. Regular cardiac rhythm. Abdomen is soft and nontender. No hepatomegaly. Right lower quadrant ileostomy. No leg edema. Palms are nontender and without erythema. Motor strength 5 over 5. Knee DTRs 2+, symmetric.   Lab Results: Lab Results  Component Value Date   WBC 3.2* 11/04/2013   HGB 13.3 11/04/2013   HCT 39.4 11/04/2013   MCV 110.9* 11/04/2013   PLT 263 11/04/2013   NEUTROABS 1.7 11/04/2013    Chemistry:    Chemistry      Component Value Date/Time   NA 141 10/01/2013 0810   NA 142 07/21/2013 0844   K 3.9 10/01/2013 0810   K 3.9 07/21/2013 0844   CL 103 06/17/2013 0400   CO2 24 10/01/2013 0810   CO2 27 06/17/2013 0400   BUN 13.1 10/01/2013 0810   BUN 5* 06/17/2013 0400   CREATININE 1.0 10/01/2013 0810   CREATININE 0.82 06/17/2013 0400      Component Value Date/Time   CALCIUM 10.3 10/01/2013 0810   CALCIUM 9.0 06/17/2013 0400   ALKPHOS 94 10/01/2013 0810   ALKPHOS 81 02/22/2013 1133   AST 27 10/01/2013 0810   AST 20 02/22/2013 1133   ALT 32 10/01/2013 0810   ALT 19 02/22/2013 1133   BILITOT 0.88 10/01/2013 0810   BILITOT 1.0 02/22/2013 1133       Studies/Results: No results found.  Medications: I have reviewed the patient's current medications.  Assessment/Plan: 1. Rectal  cancer, clinical stage III (uT3 uN1) measured at 7 cm from the anal verge.  No evidence for metastatic disease on CTs of the chest, abdomen and pelvis 02/24/2013.  Elevated CEA 02/22/2013 (13.6). Repeat CEA 08/26/2013 normal (1.1).  Initiation of neoadjuvant Xeloda and radiation on 03/22/2013, completed 04/29/2013.  Status post a low anterior resection and diverting ileostomy on 06/14/2013-ypT2,ypN0.  Cycle 1 adjuvant Xeloda beginning 07/16/2013.  Cycle 2 adjuvant Xeloda beginning 08/06/2013.  Cycle 3 adjuvant Xeloda beginning 08/27/2013.  Cycle 4 adjuvant Xeloda beginning 09/17/2013. Cycle 5 adjuvant Xeloda beginning 10/08/2013. 2. "Rheumatoid" arthritis. 3. Tobacco use. 4. Trachea/bronchial nodularity noted on the chest CT 02/24/2013. 5. Hand-foot syndrome with dryness over the palms.  Dispositon-she appears stable. She has completed the course of adjuvant chemotherapy. She will return for labs and CT scans in late June 2015 and a followup visit on 02/18/2014. We made a referral to Dr. Carlean Purl for a one-year colonoscopy July 2015.  Plan reviewed with Dr. Benay Spice.    Ned Card ANP/GNP-BC

## 2013-11-09 ENCOUNTER — Telehealth (INDEPENDENT_AMBULATORY_CARE_PROVIDER_SITE_OTHER): Payer: Self-pay | Admitting: General Surgery

## 2013-11-09 NOTE — Telephone Encounter (Signed)
Her postop recovery will depend on how well her anal sphincter is working after surgery. Healing time for the actual surgery itself is usually 3-4 weeks, but it may take longer (months) before she is able to have good continence without urgency.

## 2013-11-12 ENCOUNTER — Encounter (HOSPITAL_COMMUNITY)
Admission: RE | Admit: 2013-11-12 | Discharge: 2013-11-12 | Disposition: A | Discharge status: Home / Self Care | Payer: BC Managed Care – PPO | Source: Ambulatory Visit | Attending: General Surgery | Admitting: General Surgery

## 2013-11-12 ENCOUNTER — Encounter (HOSPITAL_COMMUNITY): Payer: Self-pay

## 2013-11-12 ENCOUNTER — Encounter (HOSPITAL_COMMUNITY): Payer: Self-pay | Admitting: Pharmacy Technician

## 2013-11-12 DIAGNOSIS — Z01812 Encounter for preprocedural laboratory examination: Secondary | ICD-10-CM | POA: Insufficient documentation

## 2013-11-12 LAB — CBC
HCT: 41.4 % (ref 36.0–46.0)
Hemoglobin: 14.3 g/dL (ref 12.0–15.0)
MCH: 37 pg — ABNORMAL HIGH (ref 26.0–34.0)
MCHC: 34.5 g/dL (ref 30.0–36.0)
MCV: 107.3 fL — ABNORMAL HIGH (ref 78.0–100.0)
PLATELETS: 244 10*3/uL (ref 150–400)
RBC: 3.86 MIL/uL — ABNORMAL LOW (ref 3.87–5.11)
RDW: 15.1 % (ref 11.5–15.5)
WBC: 4 10*3/uL (ref 4.0–10.5)

## 2013-11-12 LAB — BASIC METABOLIC PANEL
BUN: 9 mg/dL (ref 6–23)
CO2: 28 meq/L (ref 19–32)
Calcium: 10.4 mg/dL (ref 8.4–10.5)
Chloride: 102 mEq/L (ref 96–112)
Creatinine, Ser: 1.03 mg/dL (ref 0.50–1.10)
GFR calc Af Amer: 72 mL/min — ABNORMAL LOW (ref >90)
GFR calc non Af Amer: 62 mL/min — ABNORMAL LOW (ref >90)
GLUCOSE: 78 mg/dL (ref 70–99)
Potassium: 4.4 mEq/L (ref 3.7–5.3)
SODIUM: 139 meq/L (ref 137–147)

## 2013-11-12 NOTE — Progress Notes (Signed)
CT chest 02/24/13 on EPIC, EKG 12/25/12 on EPIC

## 2013-11-12 NOTE — Patient Instructions (Addendum)
[name]  11/12/2013   Your procedure is scheduled on: 11/18/13  Report to [facility] at 8:00 AM.  Call this number if you have problems the morning of surgery [phone]: [phone]   Remember:   Do not eat food or drink liquids After Midnight.     Take these medicines the morning of surgery with A SIP OF WATER: nicotine patch, zofran or compazine if needed   Do not wear jewelry, make-up or nail polish.  Do not wear lotions, powders, or perfumes. You may wear deodorant.  Do not shave 48 hours prior to surgery. Men may shave face and neck.  Do not bring valuables to the hospital.  Contacts, dentures or bridgework may not be worn into surgery.  Leave suitcase in the car. After surgery it may be brought to your room.  For patients admitted to the hospital, checkout time is 11:00 AM the day of discharge.   Please read over the following fact sheets that you were given:[place] preparing for surgery sheet  Paulette Blanch, RN  pre op nurse call if needed [phone]    FAILURE TO [name]   Patient Signature: ___________________________________________

## 2013-11-17 NOTE — Anesthesia Preprocedure Evaluation (Addendum)
Anesthesia Evaluation  Patient identified by MRN, date of birth, ID band Patient awake    Reviewed: Allergy & Precautions, H&P , NPO status , Patient's Chart, lab work & pertinent test results  History of Anesthesia Complications Negative for: history of anesthetic complications  Airway Mallampati: II TM Distance: >3 FB Neck ROM: Full    Dental  (+) Edentulous Upper, Edentulous Lower   Pulmonary Current Smoker,  Chest CT: moderate to severe emphysema breath sounds clear to auscultation  Pulmonary exam normal       Cardiovascular Exercise Tolerance: Good negative cardio ROS  Rhythm:Regular Rate:Normal  ECG 12-25-12: negative precordial t waves. Probably normal;   Neuro/Psych negative neurological ROS  negative psych ROS   GI/Hepatic negative GI ROS, Neg liver ROS,   Endo/Other  negative endocrine ROS  Renal/GU negative Renal ROS     Musculoskeletal negative musculoskeletal ROS (+)   Abdominal   Peds  Hematology negative hematology ROS (+)   Anesthesia Other Findings   Reproductive/Obstetrics negative OB ROS                          Anesthesia Physical Anesthesia Plan  ASA: II  Anesthesia Plan: General   Post-op Pain Management:    Induction: Intravenous  Airway Management Planned: Oral ETT  Additional Equipment:   Intra-op Plan:   Post-operative Plan: Extubation in OR  Informed Consent: I have reviewed the patients History and Physical, chart, labs and discussed the procedure including the risks, benefits and alternatives for the proposed anesthesia with the patient or authorized representative who has indicated his/her understanding and acceptance.   Dental advisory given  Plan Discussed with: CRNA  Anesthesia Plan Comments:        Anesthesia Quick Evaluation                                  Anesthesia Evaluation  Patient identified by MRN, date of birth, ID  band Patient awake    Reviewed: Allergy & Precautions, H&P , NPO status , Patient's Chart, lab work & pertinent test results  History of Anesthesia Complications Negative for: history of anesthetic complications  Airway Mallampati: II TM Distance: >3 FB Neck ROM: Full    Dental  (+) Edentulous Upper and Edentulous Lower   Pulmonary Current Smoker,  Chest CT: moderate to severe emphysema breath sounds clear to auscultation  Pulmonary exam normal       Cardiovascular Exercise Tolerance: Good negative cardio ROS  Rhythm:Regular Rate:Normal  ECG 12-25-12: negative precordial t waves. Probably normal;   Neuro/Psych negative neurological ROS  negative psych ROS   GI/Hepatic negative GI ROS, Neg liver ROS,   Endo/Other  negative endocrine ROS  Renal/GU negative Renal ROS     Musculoskeletal negative musculoskeletal ROS (+)   Abdominal   Peds  Hematology negative hematology ROS (+)   Anesthesia Other Findings   Reproductive/Obstetrics negative OB ROS                           Anesthesia Physical  Anesthesia Plan  ASA: III  Anesthesia Plan: General   Post-op Pain Management:    Induction: Intravenous  Airway Management Planned: Oral ETT  Additional Equipment:   Intra-op Plan:   Post-operative Plan: Extubation in OR  Informed Consent: I have reviewed the patients History and Physical, chart, labs and discussed the procedure  including the risks, benefits and alternatives for the proposed anesthesia with the patient or authorized representative who has indicated his/her understanding and acceptance.   Dental advisory given  Plan Discussed with: CRNA  Anesthesia Plan Comments:         Anesthesia Quick Evaluation

## 2013-11-18 ENCOUNTER — Inpatient Hospital Stay (HOSPITAL_COMMUNITY): Payer: BC Managed Care – PPO | Admitting: Anesthesiology

## 2013-11-18 ENCOUNTER — Encounter (HOSPITAL_COMMUNITY): Payer: Self-pay | Admitting: *Deleted

## 2013-11-18 ENCOUNTER — Inpatient Hospital Stay (HOSPITAL_COMMUNITY)
Admission: RE | Admit: 2013-11-18 | Discharge: 2013-11-21 | DRG: 345 | Disposition: A | Discharge status: Home / Self Care | Payer: BC Managed Care – PPO | Source: Ambulatory Visit | Attending: General Surgery | Admitting: General Surgery

## 2013-11-18 ENCOUNTER — Encounter (HOSPITAL_COMMUNITY): Payer: BC Managed Care – PPO | Admitting: Anesthesiology

## 2013-11-18 ENCOUNTER — Encounter (HOSPITAL_COMMUNITY)
Admission: RE | Disposition: A | Discharge status: Home / Self Care | Payer: Self-pay | Source: Ambulatory Visit | Attending: General Surgery

## 2013-11-18 DIAGNOSIS — C20 Malignant neoplasm of rectum: Secondary | ICD-10-CM | POA: Diagnosis present

## 2013-11-18 DIAGNOSIS — Z432 Encounter for attention to ileostomy: Principal | ICD-10-CM

## 2013-11-18 DIAGNOSIS — Z9049 Acquired absence of other specified parts of digestive tract: Secondary | ICD-10-CM

## 2013-11-18 DIAGNOSIS — Z87442 Personal history of urinary calculi: Secondary | ICD-10-CM

## 2013-11-18 DIAGNOSIS — Z79899 Other long term (current) drug therapy: Secondary | ICD-10-CM

## 2013-11-18 DIAGNOSIS — Z9221 Personal history of antineoplastic chemotherapy: Secondary | ICD-10-CM

## 2013-11-18 DIAGNOSIS — Z820 Family history of epilepsy and other diseases of the nervous system: Secondary | ICD-10-CM

## 2013-11-18 DIAGNOSIS — F17200 Nicotine dependence, unspecified, uncomplicated: Secondary | ICD-10-CM | POA: Diagnosis present

## 2013-11-18 DIAGNOSIS — Z833 Family history of diabetes mellitus: Secondary | ICD-10-CM

## 2013-11-18 HISTORY — PX: COLOSTOMY REVERSAL: SHX5782

## 2013-11-18 HISTORY — PX: FLEXIBLE SIGMOIDOSCOPY: SHX5431

## 2013-11-18 SURGERY — COLOSTOMY REVERSAL
Anesthesia: General | Site: Rectum

## 2013-11-18 MED ORDER — NICOTINE 21 MG/24HR TD PT24
21.0000 mg | MEDICATED_PATCH | TRANSDERMAL | Status: DC
  Administered 2013-11-18 – 2013-11-20 (×3): 21 mg via TRANSDERMAL
  Filled 2013-11-18 (×5): qty 1

## 2013-11-18 MED ORDER — NEOSTIGMINE METHYLSULFATE 1 MG/ML IJ SOLN
INTRAMUSCULAR | Status: DC | PRN
  Administered 2013-11-18: 3 mg via INTRAVENOUS

## 2013-11-18 MED ORDER — DEXAMETHASONE SODIUM PHOSPHATE 10 MG/ML IJ SOLN
INTRAMUSCULAR | Status: DC | PRN
  Administered 2013-11-18: 10 mg via INTRAVENOUS

## 2013-11-18 MED ORDER — GLYCOPYRROLATE 0.2 MG/ML IJ SOLN
INTRAMUSCULAR | Status: DC | PRN
Start: 2013-11-18 — End: 2013-11-18
  Administered 2013-11-18: 0.4 mg via INTRAVENOUS

## 2013-11-18 MED ORDER — MIDAZOLAM HCL 2 MG/2ML IJ SOLN
INTRAMUSCULAR | Status: AC
  Filled 2013-11-18: qty 2

## 2013-11-18 MED ORDER — DEXTROSE 5 % IV SOLN
2.0000 g | Freq: Three times a day (TID) | INTRAVENOUS | Status: AC
  Administered 2013-11-18: 2 g via INTRAVENOUS
  Filled 2013-11-18: qty 2

## 2013-11-18 MED ORDER — OXYCODONE HCL 5 MG PO TABS: 5.0000 mg | ORAL_TABLET | Freq: Once | ORAL | Status: DC | PRN

## 2013-11-18 MED ORDER — ACETAMINOPHEN 10 MG/ML IV SOLN
1000.0000 mg | Freq: Once | INTRAVENOUS | Status: AC
  Administered 2013-11-18: 1000 mg via INTRAVENOUS
  Filled 2013-11-18: qty 100

## 2013-11-18 MED ORDER — ENOXAPARIN SODIUM 40 MG/0.4ML ~~LOC~~ SOLN
40.0000 mg | SUBCUTANEOUS | Status: DC
  Administered 2013-11-19 – 2013-11-21 (×3): 40 mg via SUBCUTANEOUS
  Filled 2013-11-18 (×4): qty 0.4

## 2013-11-18 MED ORDER — FENTANYL CITRATE 0.05 MG/ML IJ SOLN
INTRAMUSCULAR | Status: AC
  Filled 2013-11-18: qty 5

## 2013-11-18 MED ORDER — MIDAZOLAM HCL 5 MG/5ML IJ SOLN
INTRAMUSCULAR | Status: DC | PRN
  Administered 2013-11-18: 2 mg via INTRAVENOUS

## 2013-11-18 MED ORDER — FENTANYL CITRATE 0.05 MG/ML IJ SOLN
INTRAMUSCULAR | Status: AC
  Filled 2013-11-18: qty 2

## 2013-11-18 MED ORDER — ONDANSETRON HCL 4 MG PO TABS: 4.0000 mg | ORAL_TABLET | Freq: Four times a day (QID) | ORAL | Status: DC | PRN

## 2013-11-18 MED ORDER — ROCURONIUM BROMIDE 100 MG/10ML IV SOLN
INTRAVENOUS | Status: DC | PRN
  Administered 2013-11-18: 30 mg via INTRAVENOUS
  Administered 2013-11-18: 10 mg via INTRAVENOUS

## 2013-11-18 MED ORDER — MORPHINE SULFATE 2 MG/ML IJ SOLN
2.0000 mg | INTRAMUSCULAR | Status: DC | PRN
  Administered 2013-11-18: 2 mg via INTRAVENOUS
  Filled 2013-11-18 (×2): qty 1

## 2013-11-18 MED ORDER — DEXTROSE 5 % IV SOLN
2.0000 g | INTRAVENOUS | Status: AC
  Administered 2013-11-18: 2 g via INTRAVENOUS

## 2013-11-18 MED ORDER — ALUM & MAG HYDROXIDE-SIMETH 200-200-20 MG/5ML PO SUSP: 30.0000 mL | Freq: Four times a day (QID) | ORAL | Status: DC | PRN

## 2013-11-18 MED ORDER — HYDROMORPHONE HCL PF 1 MG/ML IJ SOLN
0.2500 mg | INTRAMUSCULAR | Status: DC | PRN
  Administered 2013-11-18 (×4): 0.5 mg via INTRAVENOUS

## 2013-11-18 MED ORDER — KCL IN DEXTROSE-NACL 20-5-0.45 MEQ/L-%-% IV SOLN
INTRAVENOUS | Status: DC
  Administered 2013-11-18: 17:00:00 via INTRAVENOUS
  Filled 2013-11-18 (×2): qty 1000

## 2013-11-18 MED ORDER — HYDROMORPHONE HCL PF 1 MG/ML IJ SOLN
INTRAMUSCULAR | Status: AC
  Filled 2013-11-18: qty 1

## 2013-11-18 MED ORDER — LACTATED RINGERS IV SOLN
INTRAVENOUS | Status: DC | PRN
  Administered 2013-11-18 (×2): via INTRAVENOUS

## 2013-11-18 MED ORDER — FENTANYL CITRATE 0.05 MG/ML IJ SOLN
INTRAMUSCULAR | Status: DC | PRN
  Administered 2013-11-18 (×4): 50 ug via INTRAVENOUS

## 2013-11-18 MED ORDER — ONDANSETRON HCL 4 MG/2ML IJ SOLN
INTRAMUSCULAR | Status: DC | PRN
  Administered 2013-11-18: 4 mg via INTRAVENOUS

## 2013-11-18 MED ORDER — NEOSTIGMINE METHYLSULFATE 1 MG/ML IJ SOLN
INTRAMUSCULAR | Status: AC
  Filled 2013-11-18: qty 10

## 2013-11-18 MED ORDER — OXYCODONE HCL 5 MG/5ML PO SOLN
5.0000 mg | Freq: Once | ORAL | Status: DC | PRN
  Filled 2013-11-18: qty 5

## 2013-11-18 MED ORDER — SUCCINYLCHOLINE CHLORIDE 20 MG/ML IJ SOLN
INTRAMUSCULAR | Status: DC | PRN
  Administered 2013-11-18: 100 mg via INTRAVENOUS

## 2013-11-18 MED ORDER — ACETAMINOPHEN 500 MG PO TABS
1000.0000 mg | ORAL_TABLET | Freq: Four times a day (QID) | ORAL | Status: AC
  Administered 2013-11-18 – 2013-11-19 (×4): 1000 mg via ORAL
  Filled 2013-11-18 (×4): qty 2

## 2013-11-18 MED ORDER — GLYCOPYRROLATE 0.2 MG/ML IJ SOLN
INTRAMUSCULAR | Status: AC
  Filled 2013-11-18: qty 2

## 2013-11-18 MED ORDER — PROPOFOL 10 MG/ML IV BOLUS
INTRAVENOUS | Status: AC
  Filled 2013-11-18: qty 20

## 2013-11-18 MED ORDER — FENTANYL CITRATE 0.05 MG/ML IJ SOLN
25.0000 ug | INTRAMUSCULAR | Status: DC | PRN
  Administered 2013-11-18 (×2): 50 ug via INTRAVENOUS

## 2013-11-18 MED ORDER — KETOROLAC TROMETHAMINE 15 MG/ML IJ SOLN
15.0000 mg | Freq: Four times a day (QID) | INTRAMUSCULAR | Status: AC
  Administered 2013-11-18 – 2013-11-20 (×7): 15 mg via INTRAVENOUS
  Filled 2013-11-18 (×8): qty 1

## 2013-11-18 MED ORDER — LIDOCAINE HCL (CARDIAC) 20 MG/ML IV SOLN
INTRAVENOUS | Status: DC | PRN
  Administered 2013-11-18: 100 mg via INTRAVENOUS

## 2013-11-18 MED ORDER — ALVIMOPAN 12 MG PO CAPS
12.0000 mg | ORAL_CAPSULE | Freq: Two times a day (BID) | ORAL | Status: DC
  Filled 2013-11-18 (×2): qty 1

## 2013-11-18 MED ORDER — DEXTROSE 5 % IV SOLN
INTRAVENOUS | Status: AC
  Filled 2013-11-18: qty 2

## 2013-11-18 MED ORDER — ONDANSETRON HCL 4 MG/2ML IJ SOLN: 4.0000 mg | Freq: Four times a day (QID) | INTRAMUSCULAR | Status: DC | PRN

## 2013-11-18 MED ORDER — KETOROLAC TROMETHAMINE 15 MG/ML IJ SOLN
INTRAMUSCULAR | Status: AC
  Filled 2013-11-18: qty 1

## 2013-11-18 MED ORDER — PROPOFOL 10 MG/ML IV BOLUS
INTRAVENOUS | Status: DC | PRN
  Administered 2013-11-18: 150 mg via INTRAVENOUS

## 2013-11-18 MED ORDER — OXYCODONE HCL 5 MG PO TABS
5.0000 mg | ORAL_TABLET | ORAL | Status: DC | PRN
  Administered 2013-11-18: 10 mg via ORAL
  Administered 2013-11-18: 5 mg via ORAL
  Administered 2013-11-19 – 2013-11-21 (×10): 10 mg via ORAL
  Filled 2013-11-18: qty 2
  Filled 2013-11-18: qty 1
  Filled 2013-11-18 (×11): qty 2

## 2013-11-18 MED ORDER — LIDOCAINE HCL (CARDIAC) 20 MG/ML IV SOLN
INTRAVENOUS | Status: AC
  Filled 2013-11-18: qty 5

## 2013-11-18 MED ORDER — PROMETHAZINE HCL 25 MG/ML IJ SOLN: 6.2500 mg | INTRAMUSCULAR | Status: DC | PRN

## 2013-11-18 MED ORDER — 0.9 % SODIUM CHLORIDE (POUR BTL) OPTIME
TOPICAL | Status: DC | PRN
  Administered 2013-11-18: 2000 mL

## 2013-11-18 SURGICAL SUPPLY — 41 items
BLADE EXTENDED COATED 6.5IN (ELECTRODE) ×3 IMPLANT
BLADE HEX COATED 2.75 (ELECTRODE) ×6 IMPLANT
CANISTER SUCTION 2500CC (MISCELLANEOUS) ×3 IMPLANT
CHLORAPREP W/TINT 26ML (MISCELLANEOUS) ×3 IMPLANT
COVER MAYO STAND STRL (DRAPES) ×6 IMPLANT
DRAPE LAPAROSCOPIC ABDOMINAL (DRAPES) ×3 IMPLANT
DRAPE LG THREE QUARTER DISP (DRAPES) IMPLANT
DRAPE WARM FLUID 44X44 (DRAPE) ×3 IMPLANT
DRSG PAD ABDOMINAL 8X10 ST (GAUZE/BANDAGES/DRESSINGS) ×1 IMPLANT
ELECT REM PT RETURN 9FT ADLT (ELECTROSURGICAL) ×3
ELECTRODE REM PT RTRN 9FT ADLT (ELECTROSURGICAL) ×2 IMPLANT
GLOVE BIOGEL PI IND STRL 7.0 (GLOVE) ×4 IMPLANT
GLOVE BIOGEL PI INDICATOR 7.0 (GLOVE) ×2
GOWN STRL REUS W/TWL XL LVL3 (GOWN DISPOSABLE) ×12 IMPLANT
KIT BASIN OR (CUSTOM PROCEDURE TRAY) ×3 IMPLANT
LEGGING LITHOTOMY PAIR STRL (DRAPES) IMPLANT
LIGASURE IMPACT 36 18CM CVD LR (INSTRUMENTS) IMPLANT
NS IRRIG 1000ML POUR BTL (IV SOLUTION) ×6 IMPLANT
PACK GENERAL/GYN (CUSTOM PROCEDURE TRAY) ×3 IMPLANT
PAD TELFA 2X3 NADH STRL (GAUZE/BANDAGES/DRESSINGS) ×1 IMPLANT
SPONGE GAUZE 4X4 12PLY (GAUZE/BANDAGES/DRESSINGS) ×3 IMPLANT
SPONGE LAP 18X18 X RAY DECT (DISPOSABLE) IMPLANT
STAPLER GUN LINEAR PROX 60 (STAPLE) ×1 IMPLANT
STAPLER PROXIMATE 75MM BLUE (STAPLE) ×1 IMPLANT
STAPLER VISISTAT 35W (STAPLE) ×3 IMPLANT
SUCTION POOLE TIP (SUCTIONS) ×3 IMPLANT
SUT NOVA NAB DX-16 0-1 5-0 T12 (SUTURE) ×2 IMPLANT
SUT PDS AB 1 CTX 36 (SUTURE) IMPLANT
SUT PDS AB 1 TP1 96 (SUTURE) IMPLANT
SUT PROLENE 2 0 BLUE (SUTURE) IMPLANT
SUT SILK 2 0 (SUTURE) ×6
SUT SILK 2 0 SH CR/8 (SUTURE) ×4 IMPLANT
SUT SILK 2 0SH CR/8 30 (SUTURE) IMPLANT
SUT SILK 2-0 18XBRD TIE 12 (SUTURE) ×4 IMPLANT
SUT SILK 2-0 30XBRD TIE 12 (SUTURE) IMPLANT
SUT SILK 3 0 (SUTURE) ×3
SUT SILK 3 0 SH CR/8 (SUTURE) ×5 IMPLANT
SUT SILK 3-0 18XBRD TIE 12 (SUTURE) ×4 IMPLANT
TOWEL OR 17X26 10 PK STRL BLUE (TOWEL DISPOSABLE) ×6 IMPLANT
TOWEL OR NON WOVEN STRL DISP B (DISPOSABLE) ×1 IMPLANT
TRAY FOLEY CATH 14FRSI W/METER (CATHETERS) ×3 IMPLANT

## 2013-11-18 NOTE — Interval H&P Note (Signed)
History and Physical Interval Note:  11/18/2013 9:11 AM  Angela Schmidt  has presented today for surgery, with the diagnosis of rectal cancer, loop ileostomy  The various methods of treatment have been discussed with the patient and family. After consideration of risks, benefits and other options for treatment, the patient has consented to  Procedure(s): LOOP ILEOSTOMY CLOSURE, FLEXIBLE SIGMOIDOSCOPY (N/A) FLEXIBLE SIGMOIDOSCOPY (N/A) as a surgical intervention .  The patient's history has been reviewed, patient examined, no change in status, stable for surgery.  I have reviewed the patient's chart and labs.  Questions were answered to the patient's satisfaction.     Rosario Adie, MD  [facility] [place] [facility]

## 2013-11-18 NOTE — Op Note (Signed)
11/18/2013  12:29 PM  PATIENT:  Angela Schmidt  53 y.o. female  Patient Care Team: Midge Minium, MD as PCP - General (Family Medicine) Ladell Pier, MD as Consulting Physician (Oncology) Carola Frost, RN as Registered Nurse Carola Frost, RN as Registered Nurse Ladell Pier, MD as Consulting Physician (Oncology)  PRE-OPERATIVE DIAGNOSIS:  rectal cancer, loop ileostomy  POST-OPERATIVE DIAGNOSIS:  rectal cancer, loop ileostomy  PROCEDURE:  LOOP ILEOSTOMY CLOSURE, FLEXIBLE PROCTOSCOPY  SURGEON:  Surgeon(s): Leighton Ruff, MD Zenovia Jarred, MD  ASSISTANT: Grandville Silos   ANESTHESIA:   general  EBL:  Total I/O In: 2200 [I.V.:2100; IV Piggyback:100] Out: 150 [Urine:150]  DRAINS: none   SPECIMEN:  Source of Specimen:  ileostomy  DISPOSITION OF SPECIMEN:  PATHOLOGY  COUNTS:  YES  PLAN OF CARE: Admit to inpatient   PATIENT DISPOSITION:  PACU - hemodynamically stable.  INDICATION: This is a 53 year old female who is status post low anterior resection for a distal rectal cancer with coloanal anastomosis. She has had a diverting loop Ileostomy to protect her anastomosis. She did have some posterior breakdown of her anastomosis and required debridement in the operating room approximately a month after surgery. This is all healed and she is now ready for takedown of her ileostomy.   OR FINDINGS: Normal appearing anatomy  DESCRIPTION: the patient was identified in the preoperative holding area and taken to the OR where they were laid Supine on the operating room table.  Gen. anesthesia was induced without difficulty. SCDs were also noted to be in place prior to the initiation of anesthesia. A Foley catheter was inserted under sterile conditions. A surgical timeout was performed indicating the correct patient, procedure, positioning and need for preoperative antibiotics. At this point a flexible sigmoidoscope was introduced into the anal canal. This was inserted to  approximately 20 cm. The entire rectum was evaluated. There was no signs of anastomotic breakdown. The posterior portion of the wound had healed well. There was no stricture. There was some scarring of the anal canal causing slight angulation of the anastomosis. The patient was then prepped and draped in the usual sterile fashion.   A surgical timeout was performed indicating the correct patient, procedure, positioning and need for preoperative antibiotics.  I began by making an incision around the mucocutaneous junction of her ileostomy using a Bovie electric cautery. Dissection was carried down through the subcutaneous tissue separating the ileostomy from the surrounding subcutaneous fat. Dissection was carried down to the level of the fascia. The fascia was separated from the ostomy. The rectus muscle was also separated from the ostomy, and then we were able to enter the abdomen without difficulty. Intra-abdominal adhesions around the ileostomy were dissected using blunt dissection. Once the entire ostomy was free it was brought out of the abdomen. The 2 loops of the ostomy were brought together using a 3-0 silk suture on the antimesenteric border. An enterotomy was made using Bovie cautery and both loops. A 75 mm blue load GIA stapler was inserted and the common enterotomy channel was created. A 75 mm blue load TA stapler was used to close the common enterotomy channel. There was a small staple line bleeding vessel which was closed using 3-0 silk suture.  The anastomosis appeared intact. An anti-tension suture was placed. The anastomosis was patent. This was then placed back into the abdomen. The fascia was then closed using interrupted #1 Novafil sutures. The subcutaneous tissue was brought together over this using a pursestring 2-0  Vicryl suture. The dermal layer was then loosely closed using a 2-0 Vicryl pursestring suture. A sterile Telfa wick was placed in the center of the wound. A sterile dressing was  applied. The patient was awakened from anesthesia and sent to the postanesthesia care unit in stable condition. All counts were correct per operating room staff.

## 2013-11-18 NOTE — Anesthesia Postprocedure Evaluation (Signed)
Anesthesia Post Note  Patient: Angela Schmidt  Procedure(s) Performed: Procedure(s) (LRB): LOOP ILEOSTOMY CLOSURE, FLEXIBLE SIGMOIDOSCOPY (N/A) FLEXIBLE SIGMOIDOSCOPY (N/A)  Anesthesia type: General  Patient location: PACU  Post pain: Pain level controlled  Post assessment: Post-op Vital signs reviewed  Last Vitals: BP 122/68  Pulse 54  Temp(Src) 36.7 C (Oral)  Resp 11  Ht 5\' 3"  (1.6 m)  Wt 115 lb 15.4 oz (52.6 kg)  BMI 20.55 kg/m2  SpO2 100%  LMP 03/10/2013  Post vital signs: Reviewed  Level of consciousness: sedated  Complications: No apparent anesthesia complications

## 2013-11-18 NOTE — Transfer of Care (Signed)
Immediate Anesthesia Transfer of Care Note  Patient: Angela Schmidt  Procedure(s) Performed: Procedure(s): LOOP ILEOSTOMY CLOSURE, FLEXIBLE SIGMOIDOSCOPY (N/A) FLEXIBLE SIGMOIDOSCOPY (N/A)  Patient Location: PACU  Anesthesia Type:General  Level of Consciousness: sedated  Airway & Oxygen Therapy: Patient Spontanous Breathing and Patient connected to face mask oxygen  Post-op Assessment: Report given to PACU RN and Post -op Vital signs reviewed and stable  Post vital signs: Reviewed and stable  Complications: No apparent anesthesia complications

## 2013-11-18 NOTE — H&P (View-Only) (Signed)
Angela Schmidt is a 53 y.o. female who is status post a proctectomy and coloanal anastomosis. This was complicated by breakdown of the posterior wall of her anastomosis, which was debrided and opened operatively. She is doing well and reports no pain and draiange. She has just completed her adjuvant chemotherapy last week.  She denies any issues with her ileostomy. Past Medical History  Diagnosis Date  . History of kidney stones   . Complication of anesthesia     "shivers"  . Rectal stenosis   . S/P ileostomy   . Cancer of rectosigmoid (colon) stage III (uT3  uN1)  radiation complete 04-29-2013  currently on oral chemo med---  oncologist-- dr Benay Spice    06-14-2013  s/p anterior resection w/ Diverting ileostomy---  . Arthritis     LUMBAR  . Wears contact lenses    Past Surgical History  Procedure Laterality Date  . Bunionectomy Left 1984  . Eus N/A 02/25/2013    Procedure: LOWER ENDOSCOPIC ULTRASOUND (EUS);  Surgeon: Milus Banister, MD;  Location: [facility];  Service: Endoscopy;  Laterality: N/A;  radial  . Laparoscopic low anterior resection N/A 06/14/2013    Procedure: LAPAROSCOPIC LOW ANTERIOR RESECTION AND DIVERTING LAPAROSCOPIC ILEOSTOMY;  Surgeon: Leighton Ruff, MD;  Location: [facility];  Service: General;  Laterality: N/A;  with splenic flexure takdown  . Tubal ligation  1994  . Examination under anesthesia N/A 07/21/2013    Procedure: EXAM UNDER ANESTHESIA/ RECTAL DILITATION;  Surgeon: Leighton Ruff, MD;  Location: [facility];  Service: General;  Laterality: N/A;  . Flexible sigmoidoscopy N/A 07/21/2013    Procedure: FLEXIBLE SIGMOIDOSCOPY;  Surgeon: Leighton Ruff, MD;  Location: [facility];  Service: General;  Laterality: N/A;   Current outpatient prescriptions:ibuprofen (ADVIL,MOTRIN) 200 MG tablet, Take 400 mg by mouth every 6 (six) hours as needed., Disp: , Rfl: ;  Multiple Vitamin (MULTIVITAMIN) tablet, Take 1 tablet by mouth daily., Disp: ,  Rfl: ;  nicotine (NICODERM CQ - DOSED IN MG/24 HOURS) 21 mg/24hr patch, Place 1 patch onto the skin daily., Disp: , Rfl:  ondansetron (ZOFRAN) 8 MG tablet, Take 1 tablet (8 mg total) by mouth every 8 (eight) hours as needed for nausea., Disp: 20 tablet, Rfl: 2;  prochlorperazine (COMPAZINE) 10 MG tablet, Take 1 tablet (10 mg total) by mouth every 6 (six) hours as needed (nausea)., Disp: 40 tablet, Rfl: 3;  traMADol (ULTRAM) 50 MG tablet, Take 1 tablet (50 mg total) by mouth every 6 (six) hours as needed for pain., Disp: 30 tablet, Rfl:  capecitabine (XELODA) 500 MG tablet, Take 3 tablets (1,500 mg total) by mouth 2 (two) times daily after a meal. X 14 days, 7 day rest--, Disp: 84 tablet, Rfl: 0 Allergies  Allergen Reactions  . Demerol [Meperidine] Nausea Only   Family History  Problem Relation Age of Onset  . Arthritis Mother   . Diabetes Mother   . Thyroid disease Mother   . Arthritis Father   . Diabetes Father   . Parkinson's disease Father   . Colon cancer Neg Hx    History   Social History  . Marital Status: Divorced    Spouse Name: N/A    Number of Children: 3  . Years of Education: N/A   Occupational History  . Not on file.   Social History Main Topics  . Smoking status: Current Every Day Smoker -- 0.50 packs/day for 30 years    Types: Cigarettes  . Smokeless tobacco: Never  Used     Comment: using Nicotine patch  . Alcohol Use: No  . Drug Use: No  . Sexual Activity: Not on file   Other Topics Concern  . Not on file   Social History Narrative  . No narrative on file   Review of Systems - General ROS: negative for - chills or fever Respiratory ROS: no cough, shortness of breath, or wheezing Cardiovascular ROS: no chest pain or dyspnea on exertion Gastrointestinal ROS: no abdominal pain, change in bowel habits, or black or bloody stools Genito-Urinary ROS: no dysuria, trouble voiding, or hematuria Objective: Filed Vitals:   10/25/13 1026  BP: 120/76  Pulse: 62   Temp: 98 F (36.7 C)  Resp: 18    General appearance: alert and cooperative Resp: clear to auscultation bilaterally Cardio: regular rate and rhythm GI: normal findings: soft, non-tender Anal: Slight rectal stenosis  Assessment and Plan: Angela Schmidt Is a 53 year old female who is status post low anterior resection with coloanal anastomosis. This was complicated by breakdown of the posterior wall. This is healed under secondary intention. She has completed her adjuvant chemotherapy.  She now is ready for Ileostomy closure. I will do a flexible sigmoidoscope at the time of the OR to confirm rectal patency.  We discussed the risk and benefits of the surgery. We also discussed the changes in bowel habits that she will likely noticed after surgery including urgency and leakage. We discussed that these usually get better over time.  We also discussed ways that she can minimize problems. All questions were answered. I believe she understands the risk of surgery and has agreed to proceed.    Rosario Adie, MD [facility], [place]

## 2013-11-19 ENCOUNTER — Encounter (HOSPITAL_COMMUNITY): Payer: Self-pay | Admitting: General Surgery

## 2013-11-19 LAB — CBC
HEMATOCRIT: 32.8 % — AB (ref 36.0–46.0)
Hemoglobin: 11.1 g/dL — ABNORMAL LOW (ref 12.0–15.0)
MCH: 35.8 pg — AB (ref 26.0–34.0)
MCHC: 33.8 g/dL (ref 30.0–36.0)
MCV: 105.8 fL — ABNORMAL HIGH (ref 78.0–100.0)
Platelets: 214 10*3/uL (ref 150–400)
RBC: 3.1 MIL/uL — ABNORMAL LOW (ref 3.87–5.11)
RDW: 14.4 % (ref 11.5–15.5)
WBC: 9.6 10*3/uL (ref 4.0–10.5)

## 2013-11-19 LAB — BASIC METABOLIC PANEL
BUN: 13 mg/dL (ref 6–23)
CALCIUM: 9.4 mg/dL (ref 8.4–10.5)
CHLORIDE: 105 meq/L (ref 96–112)
CO2: 23 mEq/L (ref 19–32)
CREATININE: 0.76 mg/dL (ref 0.50–1.10)
Glucose, Bld: 139 mg/dL — ABNORMAL HIGH (ref 70–99)
Potassium: 4.4 mEq/L (ref 3.7–5.3)
Sodium: 138 mEq/L (ref 137–147)

## 2013-11-19 NOTE — Progress Notes (Signed)
1 Day Post-Op ileostomy reversal Subjective: Doing well.  Denies nausea.  Tolerating clears.  Passing flatus.  Pain controlled  Objective: Vital signs in last 24 hours: Temp:  [96.3 F (35.7 C)-98.7 F (37.1 C)] 98 F (36.7 C) (04/03 0645) Pulse Rate:  [51-66] 66 (04/03 0645) Resp:  [11-25] 16 (04/03 0645) BP: (90-140)/(47-95) 90/47 mmHg (04/03 0645) SpO2:  [92 %-100 %] 96 % (04/03 0645)   Intake/Output from previous day: 04/02 0701 - 04/03 0700 In: 4043.3 [P.O.:1180; I.V.:2763.3; IV Piggyback:100] Out: 1950 [Urine:1950] Intake/Output this shift: Total I/O In: 240 [P.O.:240] Out: 400 [Urine:400]   General appearance: alert and cooperative GI: normal findings: soft, non-tender mildly distended  Incision: dressing clean, dry, intact  Lab Results:   Recent Labs  11/19/13 0435  WBC 9.6  HGB 11.1*  HCT 32.8*  PLT 214   BMET  Recent Labs  11/19/13 0435  NA 138  K 4.4  CL 105  CO2 23  GLUCOSE 139*  BUN 13  CREATININE 0.76  CALCIUM 9.4   PT/INR No results found for this basename: LABPROT, INR,  in the last 72 hours ABG No results found for this basename: PHART, PCO2, PO2, HCO3,  in the last 72 hours  MEDS, Scheduled . alvimopan  12 mg Oral BID  . enoxaparin (LOVENOX) injection  40 mg Subcutaneous Q24H  . ketorolac  15 mg Intravenous 4 times per day  . nicotine  21 mg Transdermal Q24H    Studies/Results: No results found.  Assessment: s/p Procedure(s): LOOP ILEOSTOMY CLOSURE, [place] SIGMOIDOSCOPY Patient Active Problem List   Diagnosis Date Noted  . Rectal cancer 02/25/2013  . Screening for malignant neoplasm of the cervix 12/25/2012  . Routine gynecological examination 12/25/2012  . Diarrhea 12/25/2012    Expected post op course  Plan: Advance diet Saline lock IV   LOS: 1 day     .Rosario Adie, [facility], [place] [phone]   11/19/2013 9:57 AM

## 2013-11-19 NOTE — Progress Notes (Signed)
PHARMACY - BRIEF NOTE  Entereg ordered post-op but no documentation of pre-op dose being given (via MAR, Anesthesia report or OR summary).  Thus, Entereg has been discontinued per policy as protocol requires pre-op dose being given.  Thank you,  Doreene Eland, PharmD, BCPS.   Pager: [phone] Pharmacy # [phone] 11/19/2013 11:29 AM

## 2013-11-20 NOTE — Progress Notes (Signed)
2 Days Post-Op ileostomy reversal Subjective: Doing well.  Denies nausea.  Felt a little bloated after dinner. Passing flatus.  Had a small BM. Pain controlled  Objective: Vital signs in last 24 hours: Temp:  [97.8 F (36.6 C)-98 F (36.7 C)] 98 F (36.7 C) (04/04 0648) Pulse Rate:  [69-85] 69 (04/04 0648) Resp:  [16-20] 20 (04/04 0648) BP: (97-108)/(63-68) 108/68 mmHg (04/04 0648) SpO2:  [95 %-98 %] 98 % (04/04 0648)   Intake/Output from previous day: 04/03 0701 - 04/04 0700 In: 1200 [P.O.:600; I.V.:600] Out: 1550 [Urine:1550] Intake/Output this shift: Total I/O In: 240 [P.O.:240] Out: -    General appearance: alert and cooperative GI: normal findings: soft, non-tender mildly distended  Incision: dressing clean, intact  Lab Results:   Recent Labs  11/19/13 0435  WBC 9.6  HGB 11.1*  HCT 32.8*  PLT 214   BMET  Recent Labs  11/19/13 0435  NA 138  K 4.4  CL 105  CO2 23  GLUCOSE 139*  BUN 13  CREATININE 0.76  CALCIUM 9.4   PT/INR No results found for this basename: LABPROT, INR,  in the last 72 hours ABG No results found for this basename: PHART, PCO2, PO2, HCO3,  in the last 72 hours  MEDS, Scheduled . enoxaparin (LOVENOX) injection  40 mg Subcutaneous Q24H  . ketorolac  15 mg Intravenous 4 times per day  . nicotine  21 mg Transdermal Q24H    Studies/Results: No results found.  Assessment: s/p Procedure(s): LOOP ILEOSTOMY CLOSURE, [place] SIGMOIDOSCOPY Patient Active Problem List   Diagnosis Date Noted  . Rectal cancer 02/25/2013  . Screening for malignant neoplasm of the cervix 12/25/2012  . Routine gynecological examination 12/25/2012  . Diarrhea 12/25/2012    Expected post op course  Plan: Cont diet PO narcotics Ambulate Poss d/c tom   LOS: 2 days     .Rosario Adie, [facility], [place] [phone]   11/20/2013 11:33 AM

## 2013-11-21 MED ORDER — OXYCODONE HCL 5 MG PO TABS: 5.0000 mg | ORAL_TABLET | ORAL | Status: DC | PRN

## 2013-11-21 NOTE — Discharge Summary (Signed)
Physician Discharge Summary  Patient ID: Angela Schmidt MRN: [id number] DOB/AGE: Sep 16, 1960 53 y.o.  Admit date: 11/18/2013 Discharge date: 11/21/2013  Admission Diagnoses: Rectal cancer, loop diverting ileostomy   Discharge Diagnoses:  Active Problems:   Rectal cancer   Discharged Condition: good  Hospital Course: The patient was admitted after ileostomy reversal.  Her diet was advanced over the following 3 days.  She began to have BM's on POD 2 with relatively good control.  She was discharged to home in stable condition.    Consults: None  Significant Diagnostic Studies: labs: cbc, chemistry  Treatments: IV hydration, analgesia: acetaminophen w/ codeine and surgery: see above  Discharge Exam: Blood pressure 92/54, pulse 79, temperature 98.5 F (36.9 C), temperature source Oral, resp. rate 12, height 5\' 3"  (1.6 m), weight 115 lb 15.4 oz (52.6 kg), last menstrual period 03/10/2013, SpO2 94.00%. General appearance: alert and cooperative GI: normal findings: soft, appropriately tender, mildly distended Incision/Wound: clean, wick removed  Disposition: 01-Home or Self Care   Future Appointments Provider Department Dept Phone   2/40/9735 3:29 AM Leighton Ruff, MD [facility], [place]   02/14/2014 9:30 AM [facility] [phone]   02/14/2014 12:00 PM [facility] [phone]   Liquids only 4 hours prior to your exam. Any medications can be taken as usual. Please arrive 15 min prior to your scheduled exam time.   02/18/2014 9:45 AM Ladell Pier, MD [facility] [phone]       Medication List         ibuprofen 200 MG tablet  Commonly known as:  ADVIL,MOTRIN  Take 400 mg by mouth every 6 (six) hours as needed (Pain).     multivitamin with minerals Tabs tablet  Take 1 tablet by mouth daily.     nicotine 21 mg/24hr patch  Commonly known  as:  NICODERM CQ - dosed in mg/24 hours  Place 1 patch onto the skin daily.     ondansetron 8 MG tablet  Commonly known as:  ZOFRAN  Take 1 tablet (8 mg total) by mouth every 8 (eight) hours as needed for nausea.     oxyCODONE 5 MG immediate release tablet  Commonly known as:  Oxy IR/ROXICODONE  Take 1-2 tablets (5-10 mg total) by mouth every 4 (four) hours as needed for moderate pain, severe pain or breakthrough pain.     prochlorperazine 10 MG tablet  Commonly known as:  COMPAZINE  Take 1 tablet (10 mg total) by mouth every 6 (six) hours as needed (nausea).     traMADol 50 MG tablet  Commonly known as:  ULTRAM  Take 1 tablet (50 mg total) by mouth every 6 (six) hours as needed for pain.           Follow-up Information   Follow up with Rosario Adie., MD On 4/48/1856. (at 9:30)    Specialty:  General Surgery   Contact information:   [address] [place] [address] [phone]       Signed: Rosario Adie 0/09/6376, 5:88 AM

## 2013-11-21 NOTE — Discharge Instructions (Signed)
ABDOMINAL SURGERY: POST OP INSTRUCTIONS  1. DIET: Follow a light bland diet the first 24 hours after arrival home.  Be sure to include lots of fluids daily.  Avoid fast food or heavy meals as your are more likely to get nauseated.  Do not eat any uncooked fruits or vegetables for the next 2 weeks as your colon heals. 2. Take your usually prescribed home medications unless otherwise directed. 3. PAIN CONTROL: a. Pain is best controlled by a usual combination of three different methods TOGETHER: i. Ice/Heat ii. Over the counter pain medication iii. Prescription pain medication b. Most patients will experience some swelling and bruising around the incisions.  Ice packs or heating pads (30-60 minutes up to 6 times a day) will help. Use ice for the first few days to help decrease swelling and bruising, then switch to heat to help relax tight/sore spots and speed recovery.  Some people prefer to use ice alone, heat alone, alternating between ice & heat.  Experiment to what works for you.  Swelling and bruising can take several weeks to resolve.   c. It is helpful to take an over-the-counter pain medication regularly for the first few weeks.  Choose one of the following that works best for you: i. Naproxen (Aleve, etc)  Two 220mg  tabs twice a day ii. Ibuprofen (Advil, etc) Three 200mg  tabs four times a day (every meal & bedtime) iii. Acetaminophen (Tylenol, etc) 500-650mg  four times a day (every meal & bedtime) d. A  prescription for pain medication (such as oxycodone, hydrocodone, etc) should be given to you upon discharge.  Take your pain medication as prescribed.  i. If you are having problems/concerns with the prescription medicine (does not control pain, nausea, vomiting, rash, itching, etc), please call us [phone] to see if we need to switch you to a different pain medicine that will work better for you and/or control your side effect better. ii. If you need a refill on your pain medication,  please contact your pharmacy.  They will contact our office to request authorization. Prescriptions will not be filled after 5 pm or on week-ends. 4. Avoid getting constipated.  Between the surgery and the pain medications, it is common to experience some constipation.  Increasing fluid intake and taking a fiber supplement (such as Citrucel, FiberCon, etc) 1-2 times a day regularly will usually help prevent this problem from occurring.  A mild laxative (prune juice, Milk of Magnesia, MiraLax, etc) should be taken according to package directions if there are no bowel movements after 48 hours.   5. Watch out for diarrhea.  If you have many loose bowel movements, simplify your diet to bland foods & liquids for a few days.  Stop any stool softeners.  You may add a fiber supplement like Citrucel, Benefiber or Fibercon to help thicken your stools.  Switching to mild anti-diarrheal medications (Imodium, Pepto Bismol) can help.  If this worsens or does not improve, please call us. 6. Wash / shower every day.  You may shower over the incision / wound.  Avoid baths until the skin is fully healed.  Cover with a dry dressing and tape.  Change dressing daily and as needed.  7. ACTIVITIES as tolerated:   a. You may resume regular (light) daily activities beginning the next day--such as daily self-care, walking, climbing stairs--gradually increasing activities as tolerated.  If you can walk 30 minutes without difficulty, it is safe to try more intense activity such as jogging, treadmill, bicycling, low-impact  aerobics, swimming, etc. b. Save the most intensive and strenuous activity for last such as sit-ups, heavy lifting, contact sports, etc  Refrain from any heavy lifting or straining until you are off narcotics for pain control.   c. DO NOT PUSH THROUGH PAIN.  Let pain be your guide: If it hurts to do something, don't do it.  Pain is your body warning you to avoid that activity for another week until the pain goes  down. d. You may drive when you are no longer taking prescription pain medication, you can comfortably wear a seatbelt, and you can safely maneuver your car and apply brakes. e. Dennis Bast may have sexual intercourse when it is comfortable.  8. FOLLOW UP in our office a. Please call [facility] at [phone] to set up an appointment to see your surgeon in the office for a follow-up appointment approximately 1-2 weeks after your surgery. b. Make sure that you call for this appointment the day you arrive home to insure a convenient appointment time. 10. IF YOU HAVE DISABILITY OR FAMILY LEAVE FORMS, BRING THEM TO THE OFFICE FOR PROCESSING.  DO NOT GIVE THEM TO YOUR DOCTOR.   WHEN TO CALL us [phone]: 1. Poor pain control 2. Reactions / problems with new medications (rash/itching, nausea, etc)  3. Fever over 101.5 F (38.5 C) 4. Inability to urinate 5. Nausea and/or vomiting 6. Worsening swelling or bruising 7. Continued bleeding from incision. 8. Increased pain, redness, or drainage from the incision  The clinic staff is available to answer your questions during regular business hours (8:30am-5pm).  Please dont hesitate to call and ask to speak to one of our nurses for clinical concerns.   A surgeon from [facility] is always on call at the hospitals   If you have a medical emergency, go to the nearest emergency room or call 911.    [facility], [place], [place], [place], [place]  [address] ? MAIN: [phone] ? TOLL FREE: [phone] ? FAX [phone] [email or web address]

## 2013-11-21 NOTE — Progress Notes (Signed)
Pt discharged home; discharge instructions explained to patient; pt given copy of discharge instructions and prescription for pain medication; pt verbalized understanding discharge instructions.

## 2013-12-01 ENCOUNTER — Ambulatory Visit (INDEPENDENT_AMBULATORY_CARE_PROVIDER_SITE_OTHER): Payer: BC Managed Care – PPO | Admitting: General Surgery

## 2013-12-01 ENCOUNTER — Encounter (INDEPENDENT_AMBULATORY_CARE_PROVIDER_SITE_OTHER): Payer: Self-pay | Admitting: General Surgery

## 2013-12-01 VITALS — BP 122/76 | HR 72 | Temp 97.1°F | Resp 16 | Ht 63.0 in | Wt 110.0 lb

## 2013-12-01 DIAGNOSIS — Z9889 Other specified postprocedural states: Secondary | ICD-10-CM

## 2013-12-01 NOTE — Progress Notes (Signed)
Angela Schmidt is a 53 y.o. female who is status post a loop ileostomy reversal on 4/2.  She is doing well. She is having episodes of constipation followed by diarrhea for a couple days. This requires her to take some Imodium. She denies any abdominal pain. She has a small amount of pain at her ileostomy site.  Objective: Filed Vitals:   12/01/13 0931  BP: 122/76  Pulse: 72  Temp: 97.1 F (36.2 C)  Resp: 16    General appearance: alert and cooperative GI: normal findings: soft, non-tender  Incision: healing well   Assessment: s/p  Patient Active Problem List   Diagnosis Date Noted  . Rectal cancer 02/25/2013  . Screening for malignant neoplasm of the cervix 12/25/2012  . Diarrhea 12/25/2012    Plan: Return to the office in 3-4 weeks for evaluation of ileostomy site. Using a fiber supplement 1-2 times a day to help with irregular bowel movements.    Rosario Adie, [facility], [place]   12/01/2013 9:43 AM

## 2013-12-01 NOTE — Patient Instructions (Signed)
Try using a fiber supplement one to 2 times a day to help with irregular bowel movements.

## 2013-12-28 ENCOUNTER — Ambulatory Visit (INDEPENDENT_AMBULATORY_CARE_PROVIDER_SITE_OTHER): Payer: BC Managed Care – PPO | Admitting: General Surgery

## 2013-12-28 ENCOUNTER — Encounter (INDEPENDENT_AMBULATORY_CARE_PROVIDER_SITE_OTHER): Payer: Self-pay | Admitting: General Surgery

## 2013-12-28 VITALS — BP 122/80 | HR 76 | Temp 98.4°F | Ht 63.0 in | Wt 108.0 lb

## 2013-12-28 DIAGNOSIS — C20 Malignant neoplasm of rectum: Secondary | ICD-10-CM

## 2013-12-28 NOTE — Patient Instructions (Signed)
Use MiraLax 1-2 doses a day to help with bowel movements. Call the office if this does not get better with this treatment.

## 2013-12-28 NOTE — Progress Notes (Signed)
Angela Schmidt is a 53 y.o. female who is status post a loop ileostomy reversal on 4/2. She is doing well. She is having episodes of constipation followed by diarrhea for a couple days. She is using a fiber supplement, which makes the diarrhea better.  She has a small amount of pain at her ileostomy site.  Objective:  Filed Vitals:   12/28/13 1203  BP: 122/80  Pulse: 76  Temp: 98.4 F (36.9 C)    General appearance: alert and cooperative  GI: normal findings: soft, non-tender  Incision: healing well, no hernias  Assessment:  s/p  Patient Active Problem List    Diagnosis  Date Noted   .  Rectal cancer  02/25/2013   .  Screening for malignant neoplasm of the cervix  12/25/2012   .  Diarrhea  12/25/2012    Plan:  Return to the office in 3 months for recheck anoscopy.  Pt scheduled for CT scans, CEA and colonoscopy later this month. Constipation symptoms most likely due to coloanal scar.  Use Miralax 1-2 times a day to help with irregular bowel movements. Call the office if this doesn't work.      Rosario Adie, MD  [facility], [place]

## 2013-12-30 ENCOUNTER — Encounter: Payer: Self-pay | Admitting: Internal Medicine

## 2014-01-29 ENCOUNTER — Telehealth: Payer: Self-pay | Admitting: Oncology

## 2014-01-29 NOTE — Telephone Encounter (Signed)
HOLIDAY - MOVED 7/3 TO 7/10. S/W PT SHE IS AWARE. OTHER APPTS REMAIN THE SAME.

## 2014-02-04 ENCOUNTER — Other Ambulatory Visit: Payer: BC Managed Care – PPO

## 2014-02-07 ENCOUNTER — Ambulatory Visit (AMBULATORY_SURGERY_CENTER): Payer: Self-pay | Admitting: *Deleted

## 2014-02-07 VITALS — Ht 63.0 in | Wt 105.0 lb

## 2014-02-07 DIAGNOSIS — C20 Malignant neoplasm of rectum: Secondary | ICD-10-CM

## 2014-02-07 MED ORDER — NA SULFATE-K SULFATE-MG SULF 17.5-3.13-1.6 GM/177ML PO SOLN: 1.0000 | Freq: Once | ORAL | Status: DC

## 2014-02-07 NOTE — Progress Notes (Signed)
No allergies to eggs or soy. No problems with anesthesia.  Pt given Emmi instructions for colonoscopy  No oxygen use  No diet drug use  

## 2014-02-11 ENCOUNTER — Encounter: Payer: Self-pay | Admitting: Internal Medicine

## 2014-02-14 ENCOUNTER — Other Ambulatory Visit: Payer: BC Managed Care – PPO

## 2014-02-14 ENCOUNTER — Ambulatory Visit (HOSPITAL_COMMUNITY): Payer: BC Managed Care – PPO

## 2014-02-15 ENCOUNTER — Other Ambulatory Visit: Payer: Self-pay | Admitting: *Deleted

## 2014-02-15 NOTE — Progress Notes (Signed)
CT scan was moved to 02/23/14. CT department does not require labs for her age. Labs were ordered by MD for CBC, CEA, Cmet to complete restaging. POF to scheduler to move her 6/29 missed lab to prior to CT scan.

## 2014-02-16 ENCOUNTER — Telehealth: Payer: Self-pay | Admitting: Oncology

## 2014-02-16 NOTE — Telephone Encounter (Signed)
returned pt call re r/s 6/29 lb/ct. pt already r/s ct for 7/8. lab added for 7/8. s/w pt she is aware. also confirmed 7/10 f/u.

## 2014-02-18 ENCOUNTER — Ambulatory Visit: Payer: BC Managed Care – PPO | Admitting: Oncology

## 2014-02-21 ENCOUNTER — Ambulatory Visit (AMBULATORY_SURGERY_CENTER): Payer: BC Managed Care – PPO | Admitting: Internal Medicine

## 2014-02-21 ENCOUNTER — Encounter: Payer: Self-pay | Admitting: Internal Medicine

## 2014-02-21 VITALS — BP 102/68 | HR 61 | Temp 97.4°F | Resp 24 | Ht 63.0 in | Wt 105.0 lb

## 2014-02-21 DIAGNOSIS — Z85048 Personal history of other malignant neoplasm of rectum, rectosigmoid junction, and anus: Secondary | ICD-10-CM

## 2014-02-21 MED ORDER — SODIUM CHLORIDE 0.9 % IV SOLN: 500.0000 mL | INTRAVENOUS | Status: DC

## 2014-02-21 NOTE — Patient Instructions (Addendum)
There is scarring at the surgery site but it looks ok - no signs of cancer there and no polyps or cancer elsewhere in the colon. There is one little pouch at the anastomosis - will see what Dr. Marcello Moores thinks.  Next colonoscopy in 3 years (2018).  I appreciate the opportunity to care for you. Gatha Mayer, MD, FACG   YOU HAD AN ENDOSCOPIC PROCEDURE TODAY AT [facility]: Refer to the procedure report that was given to you for any specific questions about what was found during the examination.  If the procedure report does not answer your questions, please call your gastroenterologist to clarify.  If you requested that your care partner not be given the details of your procedure findings, then the procedure report has been included in a sealed envelope for you to review at your convenience later.  YOU SHOULD EXPECT: Some feelings of bloating in the abdomen. Passage of more gas than usual.  Walking can help get rid of the air that was put into your GI tract during the procedure and reduce the bloating. If you had a lower endoscopy (such as a colonoscopy or flexible sigmoidoscopy) you may notice spotting of blood in your stool or on the toilet paper. If you underwent a bowel prep for your procedure, then you may not have a normal bowel movement for a few days.  DIET: Your first meal following the procedure should be a light meal and then it is ok to progress to your normal diet.  A half-sandwich or bowl of soup is an example of a good first meal.  Heavy or fried foods are harder to digest and may make you feel nauseous or bloated.  Likewise meals heavy in dairy and vegetables can cause extra gas to form and this can also increase the bloating.  Drink plenty of fluids but you should avoid alcoholic beverages for 24 hours.  ACTIVITY: Your care partner should take you home directly after the procedure.  You should plan to take it easy, moving slowly for the rest of the day.  You can resume  normal activity the day after the procedure however you should NOT DRIVE or use heavy machinery for 24 hours (because of the sedation medicines used during the test).    SYMPTOMS TO REPORT IMMEDIATELY: A gastroenterologist can be reached at any hour.  During normal business hours, 8:30 AM to 5:00 PM Monday through Friday, call [phone].  After hours and on weekends, please call the GI answering service at [phone] who will take a message and have the physician on call contact you.   Following lower endoscopy (colonoscopy or flexible sigmoidoscopy):  Excessive amounts of blood in the stool  Significant tenderness or worsening of abdominal pains  Swelling of the abdomen that is new, acute  Fever of 100F or higher  FOLLOW UP: If any biopsies were taken you will be contacted by phone or by letter within the next 1-3 weeks.  Call your gastroenterologist if you have not heard about the biopsies in 3 weeks.  Our staff will call the home number listed on your records the next business day following your procedure to check on you and address any questions or concerns that you may have at that time regarding the information given to you following your procedure. This is a courtesy call and so if there is no answer at the home number and we have not heard from you through the emergency physician on call,  we will assume that you have returned to your regular daily activities without incident.  SIGNATURES/CONFIDENTIALITY: You and/or your care partner have signed paperwork which will be entered into your electronic medical record.  These signatures attest to the fact that that the information above on your After Visit Summary has been reviewed and is understood.  Full responsibility of the confidentiality of this discharge information lies with you and/or your care-partner.

## 2014-02-21 NOTE — Progress Notes (Signed)
Personal belonging placed with family.

## 2014-02-21 NOTE — Progress Notes (Signed)
Report to PACU, RN, vss, BBS= Clear.  

## 2014-02-21 NOTE — Op Note (Signed)
[place]  [address] [place], [id number]   COLONOSCOPY PROCEDURE REPORT  PATIENT: Angela Schmidt, Angela Schmidt  MR#: [id number] BIRTHDATE: 1960/10/06 , 26  yrs. old GENDER: Female ENDOSCOPIST: Gatha Mayer, MD, [facility] PROCEDURE DATE:  02/21/2014 PROCEDURE:   Colonoscopy, surveillance First Screening Colonoscopy - Avg.  risk and is 50 yrs.  old or older - No.  Prior Negative Screening - Now for repeat screening. N/A  History of Adenoma - Now for follow-up colonoscopy & has been > or = to 3 yrs.  N/A  Polyps Removed Today? No.  Recommend repeat exam, <10 yrs? Yes.  High risk (family or personal hx). ASA CLASS:   Class II INDICATIONS:High risk patient with personal history of rectal cancer.   Tx/resected 2014 MEDICATIONS: propofol (Diprivan) 200mg  IV, MAC sedation, administered by CRNA, and These medications were titrated to patient response per physician's verbal order  DESCRIPTION OF PROCEDURE:   After the risks benefits and alternatives of the procedure were thoroughly explained, informed consent was obtained.  A digital rectal exam revealed stenosis of the anal canal.   The LB [id number]  endoscope was introduced through the anus and advanced to the cecum, which was identified by both the appendix and ileocecal valve. No adverse events experienced.   The quality of the prep was Suprep good  The instrument was then slowly withdrawn as the colon was fully examined.   COLON FINDINGS: There was evidence of end-to-end and moderately stenosed prior surgical anastomosis in the anal canal. there was a small opening in the mucosa at one side in dstal aspect, no mucosal changes that looked suspicious for neoplasia or malignancy.  The colon mucosa was otherwise normal.   A right colon retroflexion was performed.  Retroflexion was not performed due to a narrow rectal vault. The time to cecum=1 minutes 02 seconds.  Withdrawal time=7 minutes 44 seconds.  The scope was  withdrawn and the procedure completed. COMPLICATIONS: There were no complications.  ENDOSCOPIC IMPRESSION: 1.   There was evidence of prior surgical anastomosis in the anal canal, moderate stenosis and a small opening in the mucosa of unclear significance if any. No signs of cancer/polyps. 2.   The colon mucosa was otherwise normal - hx rectal cancer Tx and resected 2014.  RECOMMENDATIONS: 1.  Repeat Colonoscopy in 3 years. 2.   Will ask Dr.  Marcello Moores to look at images of anastomosis - suspect the opening is post-op change without problems given how she is doing (well)  eSigned:  Gatha Mayer, MD, [facility] 02/21/2014 12:01 PM  cc: The Patient, Angela Ruff, MD

## 2014-02-22 ENCOUNTER — Telehealth: Payer: Self-pay | Admitting: Oncology

## 2014-02-22 ENCOUNTER — Telehealth: Payer: Self-pay | Admitting: *Deleted

## 2014-02-22 NOTE — Telephone Encounter (Signed)
Left message that we called for f/u 

## 2014-02-22 NOTE — Telephone Encounter (Signed)
per desk nurse moved 7/10 f/u to 7/9 w/LT. s/w pt she is aware other appts remain the same.

## 2014-02-23 ENCOUNTER — Other Ambulatory Visit (HOSPITAL_BASED_OUTPATIENT_CLINIC_OR_DEPARTMENT_OTHER): Payer: BC Managed Care – PPO

## 2014-02-23 ENCOUNTER — Ambulatory Visit (HOSPITAL_COMMUNITY)
Admission: RE | Admit: 2014-02-23 | Discharge: 2014-02-23 | Disposition: A | Discharge status: Home / Self Care | Payer: BC Managed Care – PPO | Source: Ambulatory Visit | Attending: Nurse Practitioner | Admitting: Nurse Practitioner

## 2014-02-23 ENCOUNTER — Encounter (HOSPITAL_COMMUNITY): Payer: Self-pay

## 2014-02-23 DIAGNOSIS — R109 Unspecified abdominal pain: Secondary | ICD-10-CM | POA: Insufficient documentation

## 2014-02-23 DIAGNOSIS — C20 Malignant neoplasm of rectum: Secondary | ICD-10-CM

## 2014-02-23 LAB — COMPREHENSIVE METABOLIC PANEL (CC13)
ALK PHOS: 99 U/L (ref 40–150)
ALT: 31 U/L (ref 0–55)
AST: 20 U/L (ref 5–34)
Albumin: 3.6 g/dL (ref 3.5–5.0)
Anion Gap: 10 mEq/L (ref 3–11)
BILIRUBIN TOTAL: 0.32 mg/dL (ref 0.20–1.20)
BUN: 7.1 mg/dL (ref 7.0–26.0)
CO2: 26 mEq/L (ref 22–29)
CREATININE: 0.8 mg/dL (ref 0.6–1.1)
Calcium: 10.1 mg/dL (ref 8.4–10.4)
Chloride: 106 mEq/L (ref 98–109)
Glucose: 97 mg/dl (ref 70–140)
Potassium: 3.6 mEq/L (ref 3.5–5.1)
Sodium: 141 mEq/L (ref 136–145)
Total Protein: 7.3 g/dL (ref 6.4–8.3)

## 2014-02-23 LAB — CBC WITH DIFFERENTIAL/PLATELET
BASO%: 1 % (ref 0.0–2.0)
Basophils Absolute: 0 10*3/uL (ref 0.0–0.1)
EOS%: 4.6 % (ref 0.0–7.0)
Eosinophils Absolute: 0.2 10*3/uL (ref 0.0–0.5)
HEMATOCRIT: 42.3 % (ref 34.8–46.6)
HGB: 14.1 g/dL (ref 11.6–15.9)
LYMPH%: 24.6 % (ref 14.0–49.7)
MCH: 31 pg (ref 25.1–34.0)
MCHC: 33.4 g/dL (ref 31.5–36.0)
MCV: 92.7 fL (ref 79.5–101.0)
MONO#: 0.4 10*3/uL (ref 0.1–0.9)
MONO%: 9.9 % (ref 0.0–14.0)
NEUT#: 2.6 10*3/uL (ref 1.5–6.5)
NEUT%: 59.9 % (ref 38.4–76.8)
PLATELETS: 359 10*3/uL (ref 145–400)
RBC: 4.56 10*6/uL (ref 3.70–5.45)
RDW: 13.4 % (ref 11.2–14.5)
WBC: 4.3 10*3/uL (ref 3.9–10.3)
lymph#: 1.1 10*3/uL (ref 0.9–3.3)

## 2014-02-23 LAB — CEA: CEA: 1.5 ng/mL (ref 0.0–5.0)

## 2014-02-23 MED ORDER — IOHEXOL 300 MG/ML  SOLN
80.0000 mL | Freq: Once | INTRAMUSCULAR | Status: AC | PRN
  Administered 2014-02-23: 80 mL via INTRAVENOUS

## 2014-02-24 ENCOUNTER — Ambulatory Visit (HOSPITAL_BASED_OUTPATIENT_CLINIC_OR_DEPARTMENT_OTHER): Payer: BC Managed Care – PPO | Admitting: Nurse Practitioner

## 2014-02-24 VITALS — BP 118/79 | HR 82 | Temp 98.2°F | Resp 18 | Ht 63.0 in | Wt 105.0 lb

## 2014-02-24 DIAGNOSIS — C20 Malignant neoplasm of rectum: Secondary | ICD-10-CM

## 2014-02-24 DIAGNOSIS — E079 Disorder of thyroid, unspecified: Secondary | ICD-10-CM

## 2014-02-24 NOTE — Progress Notes (Signed)
[facility] OFFICE PROGRESS NOTE   Diagnosis:   Rectal cancer.  INTERVAL HISTORY:   Ms. Satterwhite returns as scheduled. She underwent ileostomy reversal by Dr. Marcello Moores on 11/18/2013. Bowels are moving. She notes that her stools are thin. She feels the rectum is "narrow". She estimates a bowel movement takes 30-45 to complete. No rectal bleeding. No urinary symptoms. She has a good appetite. Occasional nausea. No vomiting. No mouth sores. No hand or foot pain or redness. Hands are no longer dry. Energy level is improving. No shortness of breath or cough.   Objective:  Vital signs in last 24 hours:  Blood pressure 118/79, pulse 82, temperature 98.2 F (36.8 C), temperature source Oral, resp. rate 18, height 5\' 3"  (1.6 m), weight 105 lb (47.628 kg).    HEENT: No thrush or ulcerations. Lymphatics: No palpable cervical, supraclavicular, axillary or inguinal lymph nodes. Resp: Lungs are clear. Cardio: Regular cardiac rhythm. GI: Abdomen is soft and nontender. No hepatomegaly. No mass. Ileostomy reversal incision healed. Vascular: No leg edema.  Skin: Palms without erythema.    Lab Results:  Lab Results  Component Value Date   WBC 4.3 02/23/2014   HGB 14.1 02/23/2014   HCT 42.3 02/23/2014   MCV 92.7 02/23/2014   PLT 359 02/23/2014   NEUTROABS 2.6 02/23/2014    Imaging:  Ct Chest W Contrast  02/23/2014   CLINICAL DATA:  Rectal cancer diagnosed in 2014. Chemotherapy completed. Intermittent mid abdominal pain.  EXAM: CT CHEST, ABDOMEN, AND PELVIS WITH CONTRAST  TECHNIQUE: Multidetector CT imaging of the chest, abdomen and pelvis was performed following the standard protocol during bolus administration of intravenous contrast.  CONTRAST:  12mL OMNIPAQUE IOHEXOL 300 MG/ML  SOLN  COMPARISON:  Prior examinations 02/24/2013.  FINDINGS: CT CHEST FINDINGS  There is an 8 mm low-density lesion superiorly in the right thyroid lobe on image number 1, not previously imaged. The thyroid gland  otherwise appears normal. No enlarged mediastinal, hilar or axillary lymph nodes are identified.  There is stable mild atherosclerosis. The heart size is normal. There is no pleural or pericardial effusion.  Advanced emphysematous changes are again noted throughout both lungs. There is stable biapical scarring. No suspicious pulmonary nodules or confluent airspace opacities are present. No residual nodularity within the tracheal bronchial tree is identified.  There are no worrisome osseous findings.  CT ABDOMEN AND PELVIS FINDINGS  There is stable fat within the liver adjacent to the falciform ligament. There are no suspicious hepatic findings. 1.3 cm peripherally calcified gallstone is noted. There is no gallbladder wall thickening or biliary dilatation. The spleen, pancreas, adrenal glands and kidneys are stable in appearance. There is left renal cortical thinning.  There are postsurgical changes within the right anterior abdominal wall attributed to previous ileostomy takedown. Anastomosis of the terminal ileum appears normal. There are radiation changes within the pelvis with presacral fibrotic changes and mild rectal wall thickening. 10 mm soft tissue nodule in the left adnexal on image 102 may be related to the left ovary. Small pelvic sidewall lymph nodes bilaterally are stable and not pathologically enlarged. The uterus and right adnexa appear normal. There is gas within the vagina. No ascites or peritoneal nodularity is seen. The bladder appears normal. Aortoiliac atherosclerosis appears stable.  There is chronic degenerative disc disease at L5-S1. No worrisome osseous findings demonstrated.  IMPRESSION: 1. Postsurgical changes related to low anterior resection, diverting ileostomy and subsequent takedown. 2. Radiation changes within the pelvis without definite residual disease. 3.  No distant metastases identified. 4. Cholelithiasis. 5. Age advanced emphysema.   Electronically Signed   By: Camie Patience M.D.    On: 02/23/2014 10:29   Ct Abdomen Pelvis W Contrast  02/23/2014   CLINICAL DATA:  Rectal cancer diagnosed in 2014. Chemotherapy completed. Intermittent mid abdominal pain.  EXAM: CT CHEST, ABDOMEN, AND PELVIS WITH CONTRAST  TECHNIQUE: Multidetector CT imaging of the chest, abdomen and pelvis was performed following the standard protocol during bolus administration of intravenous contrast.  CONTRAST:  53mL OMNIPAQUE IOHEXOL 300 MG/ML  SOLN  COMPARISON:  Prior examinations 02/24/2013.  FINDINGS: CT CHEST FINDINGS  There is an 8 mm low-density lesion superiorly in the right thyroid lobe on image number 1, not previously imaged. The thyroid gland otherwise appears normal. No enlarged mediastinal, hilar or axillary lymph nodes are identified.  There is stable mild atherosclerosis. The heart size is normal. There is no pleural or pericardial effusion.  Advanced emphysematous changes are again noted throughout both lungs. There is stable biapical scarring. No suspicious pulmonary nodules or confluent airspace opacities are present. No residual nodularity within the tracheal bronchial tree is identified.  There are no worrisome osseous findings.  CT ABDOMEN AND PELVIS FINDINGS  There is stable fat within the liver adjacent to the falciform ligament. There are no suspicious hepatic findings. 1.3 cm peripherally calcified gallstone is noted. There is no gallbladder wall thickening or biliary dilatation. The spleen, pancreas, adrenal glands and kidneys are stable in appearance. There is left renal cortical thinning.  There are postsurgical changes within the right anterior abdominal wall attributed to previous ileostomy takedown. Anastomosis of the terminal ileum appears normal. There are radiation changes within the pelvis with presacral fibrotic changes and mild rectal wall thickening. 10 mm soft tissue nodule in the left adnexal on image 102 may be related to the left ovary. Small pelvic sidewall lymph nodes bilaterally  are stable and not pathologically enlarged. The uterus and right adnexa appear normal. There is gas within the vagina. No ascites or peritoneal nodularity is seen. The bladder appears normal. Aortoiliac atherosclerosis appears stable.  There is chronic degenerative disc disease at L5-S1. No worrisome osseous findings demonstrated.  IMPRESSION: 1. Postsurgical changes related to low anterior resection, diverting ileostomy and subsequent takedown. 2. Radiation changes within the pelvis without definite residual disease. 3. No distant metastases identified. 4. Cholelithiasis. 5. Age advanced emphysema.   Electronically Signed   By: Camie Patience M.D.   On: 02/23/2014 10:29    Medications: I have reviewed the patient's current medications.  Assessment/Plan: 1. Rectal cancer, clinical stage III (uT3 uN1) measured at 7 cm from the anal verge.  No evidence for metastatic disease on CTs of the chest, abdomen and pelvis 02/24/2013.  Elevated CEA 02/22/2013 (13.6). Repeat CEA 08/26/2013 normal (1.1).  Initiation of neoadjuvant Xeloda and radiation on 03/22/2013, completed 04/29/2013.  Status post a low anterior resection and diverting ileostomy on 06/14/2013-ypT2,ypN0.  Cycle 1 adjuvant Xeloda beginning 07/16/2013.  Cycle 2 adjuvant Xeloda beginning 08/06/2013.  Cycle 3 adjuvant Xeloda beginning 08/27/2013.  Cycle 4 adjuvant Xeloda beginning 09/17/2013.  Cycle 5 adjuvant Xeloda beginning 10/08/2013. Colonoscopy 02/21/2014. Evidence of prior surgical anastomosis in the anal canal, moderate stenosis and a small opening in the mucosa of unclear significance if any. No signs of cancer/polyps. Colon mucosa otherwise normal. Repeat colonoscopy recommended in 3 years. 02/23/2014 CEA 1.5. CT chest/abdomen/pelvis 02/23/2014. Postsurgical changes related to the low anterior resection, diverting ileostomy and subsequent takedown. Radiation changes within the pelvis  without definite residual disease. No distant  metastases identified. 2. "Rheumatoid" arthritis. 3. Tobacco use. 4. Trachea/bronchial nodularity noted on the chest CT 02/24/2013. 5. History of hand-foot syndrome with dryness over the palms. 6. Ileostomy reversal 11/18/2013. 7. 8 mm low-density lesion right thyroid identified on chest CT 02/23/2014.   Disposition: Angela Schmidt appears stable. She remains in remission from rectal cancer.  She has symptoms of rectal stenosis. We discussed a referral to physical therapy. She has an upcoming appointment with Dr. Marcello Moores and will discuss further with her at that time.  She was noted to have a right thyroid lesion on the recent chest CT. We will defer further evaluation/management to her PCP. She plans on making an appointment with Dr. Birdie Riddle to discuss this in the near future.  She will return for a followup visit and CEA in 6 months. She will contact the office in the interim with any problems.   Plan reviewed with Dr. Benay Spice.    Ned Card ANP/GNP-BC   02/24/2014  4:35 PM

## 2014-02-25 ENCOUNTER — Telehealth: Payer: Self-pay | Admitting: Oncology

## 2014-02-25 ENCOUNTER — Ambulatory Visit: Payer: BC Managed Care – PPO | Admitting: Oncology

## 2014-02-25 NOTE — Telephone Encounter (Signed)
lvm for pt regarding to Jan 2016 appt...amiled pt appt sched/avs and letter

## 2014-03-07 ENCOUNTER — Encounter: Payer: Self-pay | Admitting: Family Medicine

## 2014-03-07 ENCOUNTER — Ambulatory Visit (INDEPENDENT_AMBULATORY_CARE_PROVIDER_SITE_OTHER): Payer: BC Managed Care – PPO | Admitting: Family Medicine

## 2014-03-07 VITALS — BP 112/80 | HR 88 | Temp 98.2°F | Resp 16 | Wt 105.0 lb

## 2014-03-07 DIAGNOSIS — K5901 Slow transit constipation: Secondary | ICD-10-CM

## 2014-03-07 DIAGNOSIS — E041 Nontoxic single thyroid nodule: Secondary | ICD-10-CM

## 2014-03-07 LAB — TSH: TSH: 0.38 u[IU]/mL (ref 0.35–4.50)

## 2014-03-07 LAB — T4, FREE: Free T4: 0.81 ng/dL (ref 0.60–1.60)

## 2014-03-07 LAB — T3, FREE: T3 FREE: 2.6 pg/mL (ref 2.3–4.2)

## 2014-03-07 MED ORDER — POLYETHYLENE GLYCOL 3350 17 GM/SCOOP PO POWD: 17.0000 g | Freq: Every day | ORAL | Status: DC

## 2014-03-07 NOTE — Patient Instructions (Signed)
Schedule your complete physical in 6 months We'll schedule your repeat thyroid US in 6 months at your physical We'll notify you of your lab results and make any changes if needed Start the Miralax once daily until you see your surgeon.  If not effective, the next step may be Linzess daily- ask the surgeon what they think Call with any questions or concerns Hang in there!  We'll figure this out!

## 2014-03-07 NOTE — Progress Notes (Signed)
Pre visit review using our clinic review tool, if applicable. No additional management support is needed unless otherwise documented below in the visit note. 

## 2014-03-07 NOTE — Progress Notes (Signed)
   Subjective:    Patient ID: Angela Schmidt, female    DOB: 1961-04-23, 53 y.o.   MRN: [id number]  HPI Thyroid nodule- pt had CT done 7/8 and was found to have incidental thyroid nodule superiorly in R lobe, 8 mm low density lesion.  Has not had thyroid labs recently.  Mother w/ hx of thyroid disorder.  Sister also had 'something removed from her thyroid' 5-6 yrs ago.  No pain.  + fatigue but this is not new for pt.  Slow transit constipation- pt has known stenosis of colon and rectum which makes BMs difficult.  Will have small BMs 'rabbit pellets or pencil thin'.  Taking stool softeners daily.  Tried Miralax in the past w/o results but did not take regularly.  Taking Fiber therapy (metamucil).   Review of Systems For ROS see HPI     Objective:   Physical Exam  Vitals reviewed. Constitutional: She is oriented to person, place, and time. She appears well-developed and well-nourished. No distress.  HENT:  Head: Normocephalic and atraumatic.  Neck: Normal range of motion. Neck supple. No thyromegaly present.  Cardiovascular: Normal rate, regular rhythm, normal heart sounds and intact distal pulses.   Pulmonary/Chest: Effort normal and breath sounds normal. No respiratory distress. She has no wheezes. She has no rales.  Abdominal: Soft. Bowel sounds are normal. She exhibits no distension. There is no tenderness. There is no rebound.  Musculoskeletal: She exhibits no edema.  Lymphadenopathy:    She has no cervical adenopathy.  Neurological: She is alert and oriented to person, place, and time.  Skin: Skin is warm and dry.  Psychiatric: She has a normal mood and affect. Her behavior is normal. Thought content normal.          Assessment & Plan:

## 2014-03-08 NOTE — Assessment & Plan Note (Signed)
New.  Recommended pt take Miralax daily.  If no improvement, will progress to [place] if surgery is ok w/ this.  Pt expressed understanding and is in agreement w/ plan.

## 2014-03-08 NOTE — Assessment & Plan Note (Signed)
New.  No imaging recommendations given.  According to guidelines on UTD, will re-image in 6 months.  Check labs.  Will follow closely.

## 2014-03-28 ENCOUNTER — Encounter (INDEPENDENT_AMBULATORY_CARE_PROVIDER_SITE_OTHER): Payer: Self-pay | Admitting: General Surgery

## 2014-03-28 ENCOUNTER — Other Ambulatory Visit (INDEPENDENT_AMBULATORY_CARE_PROVIDER_SITE_OTHER): Payer: Self-pay

## 2014-03-28 ENCOUNTER — Ambulatory Visit (INDEPENDENT_AMBULATORY_CARE_PROVIDER_SITE_OTHER): Payer: BC Managed Care – PPO | Admitting: General Surgery

## 2014-03-28 VITALS — BP 110/72 | HR 76 | Temp 98.1°F | Ht 63.0 in | Wt 104.0 lb

## 2014-03-28 DIAGNOSIS — C20 Malignant neoplasm of rectum: Secondary | ICD-10-CM

## 2014-03-28 DIAGNOSIS — K624 Stenosis of anus and rectum: Secondary | ICD-10-CM

## 2014-03-28 NOTE — Progress Notes (Signed)
Angela Schmidt is a 53 y.o. female who is status post a loop ileostomy reversal on 4/2 and LAR with coloanal anastomosis on 06/14/13.  This was complicated by anastomotic breakdown and stricture.  She is doing well. She denies weight loss.  She is experiencing stacking with her BM's.  Her constipation is better but it can take up to an hour to have a BM.  She is using a fiber supplement, which makes the diarrhea better. She has a small amount of pain at her ileostomy site.  Objective:  BP 110/72  Pulse 76  Temp(Src) 98.1 F (36.7 C)  Ht 5\' 3"  (1.6 m)  Wt 104 lb (47.174 kg)  BMI 18.43 kg/m2  General appearance: alert and cooperative  GI: normal findings: soft, non-tender  Incision: healing well, no hernias  Anal: anastomosis stricture improved, able to admit index finger with some discomfort   Assessment:  s/p  Patient Active Problem List    Diagnosis  Date Noted   .  Rectal cancer  02/25/2013   .  Screening for malignant neoplasm of the cervix  12/25/2012   .  Diarrhea  12/25/2012   Plan:  Return to the office in 3 months for recheck.  Continue bowel regimen.  Stricture getting better.  I have referred her to Earlie Counts to discuss her stricture symptoms.

## 2014-03-28 NOTE — Patient Instructions (Signed)
We will refer you for pelvic floor rehabilitation to talk about your rectal stenosis issues. Return to the office in 3 months for followup.

## 2014-04-14 ENCOUNTER — Ambulatory Visit: Payer: BC Managed Care – PPO | Attending: General Surgery | Admitting: Physical Therapy

## 2014-04-14 DIAGNOSIS — IMO0001 Reserved for inherently not codable concepts without codable children: Secondary | ICD-10-CM | POA: Diagnosis present

## 2014-04-14 DIAGNOSIS — K6289 Other specified diseases of anus and rectum: Secondary | ICD-10-CM | POA: Insufficient documentation

## 2014-04-14 DIAGNOSIS — C20 Malignant neoplasm of rectum: Secondary | ICD-10-CM | POA: Insufficient documentation

## 2014-04-14 DIAGNOSIS — M2420 Disorder of ligament, unspecified site: Secondary | ICD-10-CM | POA: Insufficient documentation

## 2014-04-14 DIAGNOSIS — M629 Disorder of muscle, unspecified: Secondary | ICD-10-CM | POA: Insufficient documentation

## 2014-04-18 ENCOUNTER — Ambulatory Visit: Payer: BC Managed Care – PPO | Admitting: Physical Therapy

## 2014-06-09 ENCOUNTER — Encounter: Payer: Self-pay | Admitting: Family Medicine

## 2014-06-09 ENCOUNTER — Ambulatory Visit (INDEPENDENT_AMBULATORY_CARE_PROVIDER_SITE_OTHER): Payer: BC Managed Care – PPO | Admitting: Family Medicine

## 2014-06-09 VITALS — BP 108/74 | HR 80 | Temp 98.5°F | Resp 16 | Ht 64.0 in | Wt 100.5 lb

## 2014-06-09 DIAGNOSIS — Z1239 Encounter for other screening for malignant neoplasm of breast: Secondary | ICD-10-CM

## 2014-06-09 DIAGNOSIS — Z0000 Encounter for general adult medical examination without abnormal findings: Secondary | ICD-10-CM

## 2014-06-09 DIAGNOSIS — Z23 Encounter for immunization: Secondary | ICD-10-CM

## 2014-06-09 DIAGNOSIS — Z780 Asymptomatic menopausal state: Secondary | ICD-10-CM

## 2014-06-09 NOTE — Progress Notes (Signed)
Pre visit review using our clinic review tool, if applicable. No additional management support is needed unless otherwise documented below in the visit note. 

## 2014-06-09 NOTE — Assessment & Plan Note (Signed)
Pt's PE WNL.  UTD on pap, colonoscopy.  Due for mammo and DEXA- order entered.  Check labs.  Anticipatory guidance provided.

## 2014-06-09 NOTE — Progress Notes (Signed)
   Subjective:    Patient ID: Angela Schmidt, female    DOB: 16-Jul-1961, 53 y.o.   MRN: [id number]  HPI CPE- UTD on colonoscopy, pap.  Due for mammo [facility]).  Completed tx for rectal cancer in April.   Review of Systems Patient reports no vision/ hearing changes, adenopathy,fever, weight change,  persistant/recurrent hoarseness , swallowing issues, chest pain, palpitations, edema, persistant/recurrent cough, hemoptysis, dyspnea (rest/exertional/paroxysmal nocturnal), gastrointestinal bleeding (melena, rectal bleeding), abdominal pain, significant heartburn, bowel changes, GU symptoms (dysuria, hematuria, incontinence), Gyn symptoms (abnormal  bleeding, pain),  syncope, focal weakness, memory loss, numbness & tingling, skin/hair/nail changes, abnormal bruising or bleeding, anxiety, or depression.  + nausea + fatigue     Objective:   Physical Exam General Appearance:    Alert, cooperative, no distress, appears stated age  Head:    Normocephalic, without obvious abnormality, atraumatic  Eyes:    PERRL, conjunctiva/corneas clear, EOM's intact, fundi    benign, both eyes  Ears:    Normal TM's and external ear canals, both ears  Nose:   Nares normal, septum midline, mucosa normal, no drainage    or sinus tenderness  Throat:   Lips, mucosa, and tongue normal; teeth and gums normal  Neck:   Supple, symmetrical, trachea midline, no adenopathy;    Thyroid: no enlargement/tenderness/nodules  Back:     Symmetric, no curvature, ROM normal, no CVA tenderness  Lungs:     Clear to auscultation bilaterally, respirations unlabored  Chest Wall:    No tenderness or deformity   Heart:    Regular rate and rhythm, S1 and S2 normal, no murmur, rub   or gallop  Breast Exam:    Deferred to mammo  Abdomen:     Soft, non-tender, bowel sounds active all four quadrants,    no masses, no organomegaly  Genitalia:    Deferred  Rectal:    Extremities:   Extremities normal, atraumatic, no cyanosis or edema    Pulses:   2+ and symmetric all extremities  Skin:   Skin color, texture, turgor normal, no rashes or lesions  Lymph nodes:   Cervical, supraclavicular, and axillary nodes normal  Neurologic:   CNII-XII intact, normal strength, sensation and reflexes    throughout          Assessment & Plan:

## 2014-06-09 NOTE — Patient Instructions (Signed)
Follow up in 1 year or as needed We'll notify you of your lab results and make any changes if needed Keep up the good work!  You look great! Call with any questions or concerns Hang in there!  You're doing great!

## 2014-06-10 LAB — CBC WITH DIFFERENTIAL/PLATELET
BASOS ABS: 0 10*3/uL (ref 0.0–0.1)
Basophils Relative: 0.5 % (ref 0.0–3.0)
EOS ABS: 0.1 10*3/uL (ref 0.0–0.7)
Eosinophils Relative: 1.8 % (ref 0.0–5.0)
HCT: 40.3 % (ref 36.0–46.0)
HEMOGLOBIN: 13.2 g/dL (ref 12.0–15.0)
LYMPHS PCT: 23.7 % (ref 12.0–46.0)
Lymphs Abs: 1.5 10*3/uL (ref 0.7–4.0)
MCHC: 32.7 g/dL (ref 30.0–36.0)
MCV: 93.7 fl (ref 78.0–100.0)
MONO ABS: 0.4 10*3/uL (ref 0.1–1.0)
Monocytes Relative: 6.6 % (ref 3.0–12.0)
NEUTROS ABS: 4.1 10*3/uL (ref 1.4–7.7)
Neutrophils Relative %: 67.4 % (ref 43.0–77.0)
Platelets: 368 10*3/uL (ref 150.0–400.0)
RBC: 4.3 Mil/uL (ref 3.87–5.11)
RDW: 13.9 % (ref 11.5–15.5)
WBC: 6.1 10*3/uL (ref 4.0–10.5)

## 2014-06-10 LAB — HEPATIC FUNCTION PANEL
ALBUMIN: 3.4 g/dL — AB (ref 3.5–5.2)
ALK PHOS: 96 U/L (ref 39–117)
ALT: 13 U/L (ref 0–35)
AST: 19 U/L (ref 0–37)
Bilirubin, Direct: 0.1 mg/dL (ref 0.0–0.3)
Total Bilirubin: 0.5 mg/dL (ref 0.2–1.2)
Total Protein: 7.7 g/dL (ref 6.0–8.3)

## 2014-06-10 LAB — LIPID PANEL
Cholesterol: 147 mg/dL (ref 0–200)
HDL: 53.8 mg/dL (ref >39.00)
LDL Cholesterol: 76 mg/dL (ref 0–99)
NonHDL: 93.2
Total CHOL/HDL Ratio: 3
Triglycerides: 86 mg/dL (ref 0.0–149.0)
VLDL: 17.2 mg/dL (ref 0.0–40.0)

## 2014-06-10 LAB — BASIC METABOLIC PANEL
BUN: 11 mg/dL (ref 6–23)
CO2: 26 mEq/L (ref 19–32)
Calcium: 9.7 mg/dL (ref 8.4–10.5)
Chloride: 102 mEq/L (ref 96–112)
Creatinine, Ser: 0.9 mg/dL (ref 0.4–1.2)
GFR: 67.14 mL/min (ref >60.00)
Glucose, Bld: 75 mg/dL (ref 70–99)
POTASSIUM: 4.1 meq/L (ref 3.5–5.1)
SODIUM: 137 meq/L (ref 135–145)

## 2014-06-10 LAB — VITAMIN D 25 HYDROXY (VIT D DEFICIENCY, FRACTURES): VITD: 29.47 ng/mL — AB (ref 30.00–100.00)

## 2014-06-10 LAB — TSH: TSH: 0.9 u[IU]/mL (ref 0.35–4.50)

## 2014-08-29 ENCOUNTER — Other Ambulatory Visit (HOSPITAL_BASED_OUTPATIENT_CLINIC_OR_DEPARTMENT_OTHER): Payer: BLUE CROSS/BLUE SHIELD

## 2014-08-29 DIAGNOSIS — C20 Malignant neoplasm of rectum: Secondary | ICD-10-CM

## 2014-08-29 LAB — CBC WITH DIFFERENTIAL/PLATELET
BASO%: 1 % (ref 0.0–2.0)
Basophils Absolute: 0.1 10*3/uL (ref 0.0–0.1)
EOS%: 1.1 % (ref 0.0–7.0)
Eosinophils Absolute: 0.1 10*3/uL (ref 0.0–0.5)
HCT: 40.7 % (ref 34.8–46.6)
HGB: 13.3 g/dL (ref 11.6–15.9)
LYMPH%: 19 % (ref 14.0–49.7)
MCH: 30.4 pg (ref 25.1–34.0)
MCHC: 32.5 g/dL (ref 31.5–36.0)
MCV: 93.5 fL (ref 79.5–101.0)
MONO#: 0.5 10*3/uL (ref 0.1–0.9)
MONO%: 8.5 % (ref 0.0–14.0)
NEUT#: 4 10*3/uL (ref 1.5–6.5)
NEUT%: 70.4 % (ref 38.4–76.8)
PLATELETS: 307 10*3/uL (ref 145–400)
RBC: 4.36 10*6/uL (ref 3.70–5.45)
RDW: 13.6 % (ref 11.2–14.5)
WBC: 5.6 10*3/uL (ref 3.9–10.3)
lymph#: 1.1 10*3/uL (ref 0.9–3.3)

## 2014-08-30 LAB — CEA: CEA: 1.7 ng/mL (ref 0.0–5.0)

## 2014-08-31 ENCOUNTER — Telehealth: Payer: Self-pay | Admitting: Oncology

## 2014-08-31 NOTE — Telephone Encounter (Signed)
Lft msg for pt confirming MD visit with ML, D/T per MD, pt states she had to r/s due to work conflict.... KJ

## 2014-09-01 ENCOUNTER — Ambulatory Visit: Payer: BC Managed Care – PPO | Admitting: Oncology

## 2014-09-01 ENCOUNTER — Other Ambulatory Visit: Payer: BC Managed Care – PPO

## 2014-09-09 ENCOUNTER — Ambulatory Visit: Payer: BLUE CROSS/BLUE SHIELD | Admitting: Nurse Practitioner

## 2014-09-09 ENCOUNTER — Telehealth: Payer: Self-pay | Admitting: Nurse Practitioner

## 2014-09-09 NOTE — Telephone Encounter (Signed)
Pt lft msg to cancel her apt will c/b to r/s due to weather... KJ

## 2015-01-19 ENCOUNTER — Encounter: Payer: Self-pay | Admitting: Family Medicine

## 2015-01-19 ENCOUNTER — Ambulatory Visit (INDEPENDENT_AMBULATORY_CARE_PROVIDER_SITE_OTHER): Payer: BLUE CROSS/BLUE SHIELD | Admitting: Family Medicine

## 2015-01-19 ENCOUNTER — Encounter: Payer: Self-pay | Admitting: Physician Assistant

## 2015-01-19 VITALS — BP 110/72 | HR 73 | Temp 98.5°F | Resp 16 | Wt 105.5 lb

## 2015-01-19 DIAGNOSIS — K5901 Slow transit constipation: Secondary | ICD-10-CM

## 2015-01-19 DIAGNOSIS — C20 Malignant neoplasm of rectum: Secondary | ICD-10-CM

## 2015-01-19 DIAGNOSIS — K6289 Other specified diseases of anus and rectum: Secondary | ICD-10-CM | POA: Insufficient documentation

## 2015-01-19 MED ORDER — POLYETHYLENE GLYCOL 3350 17 GM/SCOOP PO POWD: 17.0000 g | Freq: Every day | ORAL | Status: DC

## 2015-01-19 MED ORDER — ONDANSETRON HCL 8 MG PO TABS: 8.0000 mg | ORAL_TABLET | Freq: Three times a day (TID) | ORAL | Status: DC | PRN

## 2015-01-19 NOTE — Progress Notes (Signed)
Pre visit review using our clinic review tool, if applicable. No additional management support is needed unless otherwise documented below in the visit note. 

## 2015-01-19 NOTE — Patient Instructions (Signed)
Follow up as needed We'll call you with your GI appt Continue the Miralax Drink plenty of fluids Call with any questions or concerns Hang in there!!

## 2015-01-19 NOTE — Assessment & Plan Note (Signed)
New to provider, ongoing for pt for the last few months.  Occuring intermittently- 2-3 days/week.  Based on hx of rectal cancer s/p surgery and radiation and now rectal pain- recommend complete evaluation by GI.  Discussed that she could have pain s/p radiation tx but our 1st step is to r/o recurrence.  Based on this, recommended GI rather than oncology evaluation.  Pt expressed understanding and is in agreement w/ plan.

## 2015-01-19 NOTE — Assessment & Plan Note (Signed)
S/p surgical resection and radiation.  Pt now w/ rectal pain intermittently x2-3 months.  Refer to GI for complete evaluation.  Pt expressed understanding and is in agreement w/ plan.

## 2015-01-19 NOTE — Assessment & Plan Note (Signed)
Refill provided on Miralax.

## 2015-01-19 NOTE — Progress Notes (Signed)
   Subjective:    Patient ID: Angela Schmidt, female    DOB: 1961-03-22, 54 y.o.   MRN: [id number]  HPI Constipation- pt has hx of rectal cancer, 'my bowel movements are still crazy'.  Stools are 'long and thin'- this is normal for pt.  Still having difficulty passing stools.  Pt reports 'major dull pain in rectum' either before or after BM.  + nausea.  Pain improved w/ tramadol and ibuprofen yesterday but pain returned by afternoon.  Has not discussed w/ GI.  No relief of pain w/ BM.  Pt reports pain will occur 2-3x/week x2-3 months.  Pain will last for entire day.  No blood in stool.  Pt taking Miralax regularly.  Denies hemorrhoids.  Will sit on heating pad and butt donut when pain occurs.  Asymptomatic today.  S/p rectal surgery and radiation for cancer.   Review of Systems For ROS see HPI     Objective:   Physical Exam  Constitutional: She is oriented to person, place, and time. She appears well-developed and well-nourished. No distress.  HENT:  Head: Normocephalic and atraumatic.  Abdominal: Soft. Bowel sounds are normal. She exhibits no distension. There is no tenderness. There is no rebound and no guarding.  Genitourinary:  Pt declined rectal exam today  Neurological: She is alert and oriented to person, place, and time.  Psychiatric: She has a normal mood and affect. Her behavior is normal. Thought content normal.  Vitals reviewed.         Assessment & Plan:

## 2015-02-15 ENCOUNTER — Encounter: Payer: Self-pay | Admitting: Physician Assistant

## 2015-02-15 ENCOUNTER — Other Ambulatory Visit (INDEPENDENT_AMBULATORY_CARE_PROVIDER_SITE_OTHER): Payer: BLUE CROSS/BLUE SHIELD

## 2015-02-15 ENCOUNTER — Ambulatory Visit (INDEPENDENT_AMBULATORY_CARE_PROVIDER_SITE_OTHER): Payer: BLUE CROSS/BLUE SHIELD | Admitting: Physician Assistant

## 2015-02-15 VITALS — BP 120/60 | HR 72 | Ht 64.0 in | Wt 103.6 lb

## 2015-02-15 DIAGNOSIS — Z85048 Personal history of other malignant neoplasm of rectum, rectosigmoid junction, and anus: Secondary | ICD-10-CM

## 2015-02-15 DIAGNOSIS — R194 Change in bowel habit: Secondary | ICD-10-CM | POA: Diagnosis not present

## 2015-02-15 DIAGNOSIS — K624 Stenosis of anus and rectum: Secondary | ICD-10-CM

## 2015-02-15 DIAGNOSIS — K6289 Other specified diseases of anus and rectum: Secondary | ICD-10-CM

## 2015-02-15 LAB — CBC WITH DIFFERENTIAL/PLATELET
BASOS ABS: 0 10*3/uL (ref 0.0–0.1)
Basophils Relative: 0.6 % (ref 0.0–3.0)
EOS PCT: 1.8 % (ref 0.0–5.0)
Eosinophils Absolute: 0.1 10*3/uL (ref 0.0–0.7)
HCT: 42.1 % (ref 36.0–46.0)
HEMOGLOBIN: 14 g/dL (ref 12.0–15.0)
LYMPHS PCT: 23.5 % (ref 12.0–46.0)
Lymphs Abs: 1.4 10*3/uL (ref 0.7–4.0)
MCHC: 33.3 g/dL (ref 30.0–36.0)
MCV: 91.1 fl (ref 78.0–100.0)
MONOS PCT: 9.1 % (ref 3.0–12.0)
Monocytes Absolute: 0.6 10*3/uL (ref 0.1–1.0)
NEUTROS PCT: 65 % (ref 43.0–77.0)
Neutro Abs: 4 10*3/uL (ref 1.4–7.7)
Platelets: 383 10*3/uL (ref 150.0–400.0)
RBC: 4.62 Mil/uL (ref 3.87–5.11)
RDW: 13.9 % (ref 11.5–15.5)
WBC: 6.1 10*3/uL (ref 4.0–10.5)

## 2015-02-15 NOTE — Progress Notes (Signed)
Agree with Ms. Esterwood's assessment and plan. Carl E. Gessner, MD, FACG   

## 2015-02-15 NOTE — Patient Instructions (Signed)
Please go to the basement level to have your labs drawn.   You have been scheduled for a colonoscopy. Please follow written instructions given to you at your visit today.  Please pick up your prep supplies at the pharmacy within the next 1-3 days. [facility] [address] [facility].  If you use inhalers (even only as needed), please bring them with you on the day of your procedure. Your physician has requested that you go to www.startemmi.com and enter the access code given to you at your visit today. This web site gives a general overview about your procedure. However, you should still follow specific instructions given to you by our office regarding your preparation for the procedure.

## 2015-02-15 NOTE — Progress Notes (Signed)
Patient ID: Aren Cherne, female   DOB: [birth year], 54 y.o.   MRN: [id number]   Subjective:    Patient ID: Perry Mount, female    DOB: [birth year], 54 y.o.   MRN: [id number]  HPI Hila is a very nice 54 year old white female known to Dr. Carlean Purl who comes in today with complaints of 1 month history of rectal pain. Patient has history of rectal cancer which would's diagnosed in 2014 she initially underwent a low anterior resection 06/14/2013. This was complicated by anastomotic breakdown and stricture and required a second operation about a month later. She ultimately underwent reversal of loop ileostomy in April 2015 per Dr. Marcello Moores. In the interim she completed a course of chemotherapy with Xeloda. Patient has had problems with known anastomotic stenosis since. She last underwent colonoscopy July 2015 per Dr. Carlean Purl with finding of anal stenosis, and prior surgical anastomosis in the anal canal with moderate stenosis and a small opening in the mucosa of unclear significance no sign of cancer or polyps. Plan was to repeat colon in 3 years. Patient says she has always had very narrow caliber stools since her surgery and says it takes a long time to pass a bowel movement. Now over the past 1-2 months she has developed internal rectal pain which is bothering her 3-4 days per week. She says on the days when she's having pain she has to take tramadol ibuprofen and then set on a heating pad. The pain may last 6-8 hours will gradually improve, may come back at night and wake her up at which time she takes more pain medicine. She says she'll have a good day in between and then the pain comes back the following day. She describes it as a deep achy type pain. She has not been having any bleeding but has noticed a change in her bowel habits with bowels being very erratic. She says she's having some days were all she is passing is watery stool and other days may have to try several times to complete a bowel  movement. She denies any abdominal or pelvic pain. No fever. Last CEA was done in January 2016 was 1.7.  Review of Systems Pertinent positive and negative review of systems were noted in the above HPI section.  All other review of systems was otherwise negative.  Outpatient Encounter Prescriptions as of 02/15/2015  Medication Sig  . ibuprofen (ADVIL,MOTRIN) 200 MG tablet Take 400 mg by mouth every 6 (six) hours as needed (Pain).   . Multiple Vitamin (MULTIVITAMIN WITH MINERALS) TABS tablet Take 1 tablet by mouth daily.  . nicotine (NICODERM CQ - DOSED IN MG/24 HOURS) 21 mg/24hr patch Place 1 patch onto the skin daily.  . ondansetron (ZOFRAN) 8 MG tablet Take 1 tablet (8 mg total) by mouth every 8 (eight) hours as needed for nausea.  . polyethylene glycol powder (GLYCOLAX/MIRALAX) powder Take 17 g by mouth daily.  . prochlorperazine (COMPAZINE) 10 MG tablet Take 1 tablet (10 mg total) by mouth every 6 (six) hours as needed (nausea).  . traMADol (ULTRAM) 50 MG tablet Take 1 tablet (50 mg total) by mouth every 6 (six) hours as needed for pain.   No facility-administered encounter medications on file as of 02/15/2015.   Allergies  Allergen Reactions  . Demerol [Meperidine] Nausea Only   Patient Active Problem List   Diagnosis Date Noted  . Rectal pain 01/19/2015  . Physical exam 06/09/2014  . Right thyroid nodule 03/07/2014  . Slow transit constipation 03/07/2014  .  Personal history of malignant neoplasm of rectum 2014 02/21/2014  . Rectal cancer 02/25/2013  . Screening for malignant neoplasm of the cervix 12/25/2012  . Diarrhea 12/25/2012   History   Social History  . Marital Status: Divorced    Spouse Name: N/A  . Number of Children: 3  . Years of Education: N/A   Occupational History  . title closer    Social History Main Topics  . Smoking status: Current Some Day Smoker -- 30 years    Types: Cigarettes  . Smokeless tobacco: Never Used     Comment: using Nicotine patch-  maybe 2 cig  . Alcohol Use: No  . Drug Use: No  . Sexual Activity: Not on file   Other Topics Concern  . Not on file   Social History Narrative    Ms. Newport's family history includes Arthritis in her father and mother; Diabetes in her father and mother; Parkinson's disease in her father; Thyroid disease in her mother. There is no history of Colon cancer.      Objective:    Filed Vitals:   02/15/15 1026  BP: 120/60  Pulse: 72    Physical Exam  well-developed white female in no acute distress, pleasant blood pressure 120/60 pulse 72 height 5 foot 4 weight 103 HEENT; nontraumatic normocephalic EOMI PERRLA sclera anicteric, Neck; supple no JVD, Cardiovascular; regular rate and rhythm with S1-S2 no murmur or gallop, Pulmonary ;clear bilaterally, Abdomen ;soft nontender nondistended bowel sounds are active there is no palpable mass or hepatosplenomegaly line incisional scar and a scar in the right lower quadrant from prior ileostomy, Rectal ;exam not done, Extremities; no clubbing cyanosis or edema skin warm and dry, Psych ;mood and affect appropriate       Assessment & Plan:   #1 54 yo female with history of rectal cancer status post low anterior resection 08/27/3233, complicated by an elastic moderate breakdown and stricture with revision surgery one month later. She is also now status post reversal of loop ileostomy 11/18/2013. Patient had completed a course of Xeloda in February 2015. Patient has known anastomotic stenosis. #2 new rectal pain 6 weeks and change in bowel habits with episodes of watery stool. Will need to rule out recurrent cancer, ulceration of anastomosis and or worsening of stricture.  Plan; CBC with differential and CEA Schedule for colonoscopy with Dr. Carlean Purl. Procedure discussed in detail with the patient and she is agreeable to proceed. Patient may need to be reevaluated by surgery/Dr. Marcello Moores pending results of that colonoscopy.   Amy S Esterwood  PA-C 02/15/2015   Cc: Midge Minium, MD

## 2015-02-16 LAB — CEA: CEA: 1.8 ng/mL (ref 0.0–5.0)

## 2015-02-28 ENCOUNTER — Telehealth: Payer: Self-pay | Admitting: Internal Medicine

## 2015-02-28 ENCOUNTER — Telehealth: Payer: Self-pay

## 2015-02-28 MED ORDER — NA SULFATE-K SULFATE-MG SULF 17.5-3.13-1.6 GM/177ML PO SOLN: ORAL | Status: DC

## 2015-02-28 NOTE — Telephone Encounter (Signed)
Suprep sent in earlier today needed a prior authorization.  We located a sample and put it up front for pick up.  Left detailed message for patient.

## 2015-02-28 NOTE — Telephone Encounter (Signed)
Left patient detailed message that [facility].

## 2015-03-03 ENCOUNTER — Ambulatory Visit (AMBULATORY_SURGERY_CENTER): Payer: BLUE CROSS/BLUE SHIELD | Admitting: Internal Medicine

## 2015-03-03 ENCOUNTER — Encounter: Payer: Self-pay | Admitting: Internal Medicine

## 2015-03-03 VITALS — BP 92/63 | HR 72 | Temp 96.5°F | Resp 17 | Ht 64.0 in | Wt 103.0 lb

## 2015-03-03 DIAGNOSIS — K621 Rectal polyp: Secondary | ICD-10-CM | POA: Diagnosis not present

## 2015-03-03 DIAGNOSIS — Z85048 Personal history of other malignant neoplasm of rectum, rectosigmoid junction, and anus: Secondary | ICD-10-CM | POA: Diagnosis not present

## 2015-03-03 DIAGNOSIS — K624 Stenosis of anus and rectum: Secondary | ICD-10-CM | POA: Diagnosis not present

## 2015-03-03 DIAGNOSIS — K6289 Other specified diseases of anus and rectum: Secondary | ICD-10-CM | POA: Diagnosis present

## 2015-03-03 MED ORDER — SODIUM CHLORIDE 0.9 % IV SOLN: 500.0000 mL | INTRAVENOUS | Status: DC

## 2015-03-03 NOTE — Op Note (Signed)
[place]  [address] [place], [id number]   COLONOSCOPY PROCEDURE REPORT  PATIENT: Angela Schmidt, Angela Schmidt  MR#: [id number] BIRTHDATE: 12/04/1960 , 89  yrs. old GENDER: female ENDOSCOPIST: Gatha Mayer, MD, [facility] PROCEDURE DATE:  03/03/2015 PROCEDURE:   Colonoscopy with biopsy incomplete First Screening Colonoscopy - Avg.  risk and is 50 yrs.  old or older - No.  Prior Negative Screening - Now for repeat screening. N/A  History of Adenoma - Now for follow-up colonoscopy & has been > or = to 3 yrs.  N/A  Polyps removed today? No Recommend repeat exam, <10 yrs? Yes high risk ASA CLASS:   Class II INDICATIONS:Patient is not applicable for Colorectal Neoplasm Risk Assessment for this procedure.   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  The digital rectal exam revealed a rectal stricture - distal , firm and smooth and finger would not pass.. The LB [id number]  endoscope was introduced through the anus and advanced to the sigmoid colon. No adverse events experienced. Limited by poor preparation.   The quality of the prep was poor. The instrument was then slowly withdrawn as the colon was fully examined. Estimated blood loss is zero unless otherwise noted in this procedure report.      COLON FINDINGS: There was a short slightly friable and benign appearing stricture in the rectum. there was an associated diverticulum from prior surgery I think. The stricture was traversable.  Multiple biopsies were performed using cold forceps. Sigmoid  Retroflexion was not performed due to a narrow rectal vault. The time to cecum = 0.4 Withdrawal time = 2.6   The scope was withdrawn and the procedure completed. COMPLICATIONS: There were no immediate  complications.  ENDOSCOPIC IMPRESSION: There was a short stricture in the distal rectum; finger would not pass on rectal excam; multiple biopsies were performed using cold forceps Limited sigmoid views - poor prep ? if she had been dealing with impaction above this Gastroscope used for procedure  RECOMMENDATIONS: Await bxs - may need imagimng (MR vs CT) BID MiraLax  eSigned:  Gatha Mayer, MD, [facility] 37/11/8887 1:69 AM   cc: Leighton Ruff, MD, Dimple Nanas, MD and The Patient

## 2015-03-03 NOTE — Progress Notes (Signed)
Called to room to assist during endoscopic procedure.  Patient ID and intended procedure confirmed with present staff. Received instructions for my participation in the procedure from the performing physician.  

## 2015-03-03 NOTE — Patient Instructions (Addendum)
Things look similar to last year with a narrow area or stricture in the rectum. It does not look like cancer but I took biopsies of the area.  I think yo will need either an MRI or CT scan of the area but I want to review the biopsies first.  It is possible that you have been constipated causing the problem because the prep did not flush out the colon well so I could only look in the rectum and just above.  I will call you next week with results and plans. Please increase MiraLax to 2 doses every day.  I appreciate the opportunity to care for you. Gatha Mayer, MD, FACG  YOU HAD AN ENDOSCOPIC PROCEDURE TODAY AT [facility]:   Refer to the procedure report that was given to you for any specific questions about what was found during the examination.  If the procedure report does not answer your questions, please call your gastroenterologist to clarify.  If you requested that your care partner not be given the details of your procedure findings, then the procedure report has been included in a sealed envelope for you to review at your convenience later.  YOU SHOULD EXPECT: Some feelings of bloating in the abdomen. Passage of more gas than usual.  Walking can help get rid of the air that was put into your GI tract during the procedure and reduce the bloating. If you had a lower endoscopy (such as a colonoscopy or flexible sigmoidoscopy) you may notice spotting of blood in your stool or on the toilet paper. If you underwent a bowel prep for your procedure, you may not have a normal bowel movement for a few days.  Please Note:  You might notice some irritation and congestion in your nose or some drainage.  This is from the oxygen used during your procedure.  There is no need for concern and it should clear up in a day or so.  SYMPTOMS TO REPORT IMMEDIATELY:   Following lower endoscopy (colonoscopy or flexible sigmoidoscopy):  Excessive amounts of blood in the  stool  Significant tenderness or worsening of abdominal pains  Swelling of the abdomen that is new, acute  Fever of 100F or higher  For urgent or emergent issues, a gastroenterologist can be reached at any hour by calling [phone].   DIET: Your first meal following the procedure should be a small meal and then it is ok to progress to your normal diet. Heavy or fried foods are harder to digest and may make you feel nauseous or bloated.  Likewise, meals heavy in dairy and vegetables can increase bloating.  Drink plenty of fluids but you should avoid alcoholic beverages for 24 hours.  ACTIVITY:  You should plan to take it easy for the rest of today and you should NOT DRIVE or use heavy machinery until tomorrow (because of the sedation medicines used during the test).    FOLLOW UP: Our staff will call the number listed on your records the next business day following your procedure to check on you and address any questions or concerns that you may have regarding the information given to you following your procedure. If we do not reach you, we will leave a message.  However, if you are feeling well and you are not experiencing any problems, there is no need to return our call.  We will assume that you have returned to your regular daily activities without incident.  If any biopsies were  taken you will be contacted by phone or by letter within the next 1-3 weeks.  Please call us at [phone] if you have not heard about the biopsies in 3 weeks.    SIGNATURES/CONFIDENTIALITY: You and/or your care partner have signed paperwork which will be entered into your electronic medical record.  These signatures attest to the fact that that the information above on your After Visit Summary has been reviewed and is understood.  Full responsibility of the confidentiality of this discharge information lies with you and/or your care-partner.

## 2015-03-03 NOTE — Progress Notes (Signed)
Incomplete procedure, to recovery, report to [place], RN, VSS.

## 2015-03-06 ENCOUNTER — Telehealth: Payer: Self-pay | Admitting: *Deleted

## 2015-03-06 NOTE — Telephone Encounter (Signed)
  Follow up Call-  Call back number 03/03/2015 02/21/2014 02/22/2013  Post procedure Call Back phone  # [phone]  Permission to leave phone message Yes Yes Yes     No answer at # given.  Left message on voicemail.

## 2015-03-07 NOTE — Progress Notes (Signed)
Quick Note:  Biopsies benign - no cancer Please see if she is moving bowels ok   LEC No letter She should have en existing colon recall - let me know if not ______

## 2015-03-10 NOTE — Progress Notes (Signed)
Quick Note:  Have increase the MiraLax to 3-4 doses a day and I am going to confer with Dr Marcello Moores about this ______

## 2015-03-15 NOTE — Progress Notes (Signed)
Quick Note:  Please tell her that Dr. Marcello Moores (and I) think she may benefit from balloon dilation of the narrow area. Before we do that she needs a single column limited barium enema (they only need to evaluate the rectal stricture so do not need to fill out whole colon with barium)  Will await that study and then most likely schedule the balloon dilation of stricture but I will explain to her by phone before we do that ______

## 2015-03-20 ENCOUNTER — Other Ambulatory Visit: Payer: Self-pay

## 2015-03-20 DIAGNOSIS — K624 Stenosis of anus and rectum: Secondary | ICD-10-CM

## 2015-03-29 ENCOUNTER — Ambulatory Visit (HOSPITAL_COMMUNITY): Admission: RE | Admit: 2015-03-29 | Payer: BLUE CROSS/BLUE SHIELD | Source: Ambulatory Visit

## 2015-04-04 ENCOUNTER — Encounter: Payer: Self-pay | Admitting: Family Medicine

## 2015-04-04 ENCOUNTER — Ambulatory Visit (INDEPENDENT_AMBULATORY_CARE_PROVIDER_SITE_OTHER): Payer: BLUE CROSS/BLUE SHIELD | Admitting: Family Medicine

## 2015-04-04 VITALS — BP 118/78 | HR 74 | Temp 97.9°F | Resp 16 | Wt 102.5 lb

## 2015-04-04 DIAGNOSIS — R21 Rash and other nonspecific skin eruption: Secondary | ICD-10-CM | POA: Insufficient documentation

## 2015-04-04 DIAGNOSIS — K624 Stenosis of anus and rectum: Secondary | ICD-10-CM | POA: Insufficient documentation

## 2015-04-04 MED ORDER — NYSTATIN 100000 UNIT/GM EX OINT: 1.0000 "application " | TOPICAL_OINTMENT | Freq: Two times a day (BID) | CUTANEOUS | Status: DC

## 2015-04-04 NOTE — Assessment & Plan Note (Signed)
New.  Reviewed colonoscopy results w/ pt and did locate GI's tx plan in the chart notes.  Reviewed need for barium enema and subsequent plan for balloon dilation.  Pt appreciative as this now makes more sense to her.  Encouraged her to f/u w/ GI to arrange procedures.  Will follow.

## 2015-04-04 NOTE — Progress Notes (Signed)
Pre visit review using our clinic review tool, if applicable. No additional management support is needed unless otherwise documented below in the visit note. 

## 2015-04-04 NOTE — Patient Instructions (Signed)
Follow up as needed Please contact GI so you can discuss scheduling the barium enema and subsequent balloon dilation Apply the Nystatin twice daily to allow the rash to heal Call with any questions or concerns Hang in there!!!

## 2015-04-04 NOTE — Progress Notes (Signed)
   Subjective:    Patient ID: Angela Schmidt, female    DOB: 1961/03/12, 54 y.o.   MRN: [id number]  HPI Rash- occuring when pt is having acidic, watery stools.  Rash will clear but then return as the stools change.  Rash burns.  Rectal stenosis- found on colonoscopy.  Pt was scheduled for barium enema but cancelled this b/c she did not know what it was for.  Reviewed chart and told pt that enema was to precede the balloon dilation of the stricture but pt was unaware of this.  Pt is frustrated b/c she feels none of this was explained to her- 'starting w/ even the presence of the stricture let alone the follow up plan'.   Review of Systems For ROS see HPI     Objective:   Physical Exam  Constitutional: She is oriented to person, place, and time. She appears well-developed and well-nourished. No distress.  HENT:  Head: Normocephalic and atraumatic.  Abdominal: Soft. Bowel sounds are normal. She exhibits no distension. There is no tenderness. There is no rebound.  Neurological: She is alert and oriented to person, place, and time.  Skin: Skin is warm and dry. Rash (mildly erythematous rash around rectum consistent w/ fungal dermatitis) noted.  Psychiatric: She has a normal mood and affect. Her behavior is normal. Thought content normal.  Vitals reviewed.         Assessment & Plan:

## 2015-04-04 NOTE — Assessment & Plan Note (Signed)
New.  Pt w/ apparent fungal dermatitis around rectum due to acidic, watery stools.  Start topical nystatin ointment.  Reviewed supportive care and red flags that should prompt return.  Pt expressed understanding and is in agreement w/ plan.

## 2015-06-13 ENCOUNTER — Telehealth: Payer: Self-pay | Admitting: Behavioral Health

## 2015-06-13 NOTE — Telephone Encounter (Signed)
Unable to reach patient at time of Pre-Visit Call.  Left message for patient to return call when available.    

## 2015-06-14 ENCOUNTER — Encounter: Payer: Self-pay | Admitting: Family Medicine

## 2015-06-14 ENCOUNTER — Other Ambulatory Visit: Payer: Self-pay | Admitting: General Practice

## 2015-06-14 ENCOUNTER — Ambulatory Visit (INDEPENDENT_AMBULATORY_CARE_PROVIDER_SITE_OTHER): Payer: BLUE CROSS/BLUE SHIELD | Admitting: Family Medicine

## 2015-06-14 VITALS — BP 118/78 | HR 93 | Temp 98.0°F | Resp 16 | Ht 64.0 in | Wt 101.0 lb

## 2015-06-14 DIAGNOSIS — C20 Malignant neoplasm of rectum: Secondary | ICD-10-CM

## 2015-06-14 DIAGNOSIS — Z0000 Encounter for general adult medical examination without abnormal findings: Secondary | ICD-10-CM

## 2015-06-14 LAB — CBC WITH DIFFERENTIAL/PLATELET
BASOS ABS: 0 10*3/uL (ref 0.0–0.1)
BASOS PCT: 0.4 % (ref 0.0–3.0)
EOS PCT: 1.3 % (ref 0.0–5.0)
Eosinophils Absolute: 0.1 10*3/uL (ref 0.0–0.7)
HEMATOCRIT: 42 % (ref 36.0–46.0)
Hemoglobin: 13.9 g/dL (ref 12.0–15.0)
LYMPHS ABS: 1.2 10*3/uL (ref 0.7–4.0)
Lymphocytes Relative: 17.5 % (ref 12.0–46.0)
MCHC: 33 g/dL (ref 30.0–36.0)
MCV: 91.3 fl (ref 78.0–100.0)
MONOS PCT: 7.6 % (ref 3.0–12.0)
Monocytes Absolute: 0.5 10*3/uL (ref 0.1–1.0)
NEUTROS ABS: 5 10*3/uL (ref 1.4–7.7)
NEUTROS PCT: 73.2 % (ref 43.0–77.0)
PLATELETS: 513 10*3/uL — AB (ref 150.0–400.0)
RBC: 4.61 Mil/uL (ref 3.87–5.11)
RDW: 14 % (ref 11.5–15.5)
WBC: 6.8 10*3/uL (ref 4.0–10.5)

## 2015-06-14 LAB — LIPID PANEL
CHOLESTEROL: 143 mg/dL (ref 0–200)
HDL: 52.8 mg/dL (ref >39.00)
LDL CALC: 73 mg/dL (ref 0–99)
NonHDL: 90.12
TRIGLYCERIDES: 88 mg/dL (ref 0.0–149.0)
Total CHOL/HDL Ratio: 3
VLDL: 17.6 mg/dL (ref 0.0–40.0)

## 2015-06-14 LAB — HEPATIC FUNCTION PANEL
ALK PHOS: 100 U/L (ref 39–117)
ALT: 11 U/L (ref 0–35)
AST: 14 U/L (ref 0–37)
Albumin: 3.7 g/dL (ref 3.5–5.2)
Bilirubin, Direct: 0.1 mg/dL (ref 0.0–0.3)
TOTAL PROTEIN: 7.3 g/dL (ref 6.0–8.3)
Total Bilirubin: 0.4 mg/dL (ref 0.2–1.2)

## 2015-06-14 LAB — BASIC METABOLIC PANEL
BUN: 9 mg/dL (ref 6–23)
CO2: 29 mEq/L (ref 19–32)
Calcium: 9.9 mg/dL (ref 8.4–10.5)
Chloride: 105 mEq/L (ref 96–112)
Creatinine, Ser: 0.84 mg/dL (ref 0.40–1.20)
GFR: 75.22 mL/min (ref >60.00)
GLUCOSE: 76 mg/dL (ref 70–99)
POTASSIUM: 3.7 meq/L (ref 3.5–5.1)
Sodium: 142 mEq/L (ref 135–145)

## 2015-06-14 LAB — TSH: TSH: 1.23 u[IU]/mL (ref 0.35–4.50)

## 2015-06-14 LAB — VITAMIN D 25 HYDROXY (VIT D DEFICIENCY, FRACTURES): VITD: 17.61 ng/mL — ABNORMAL LOW (ref 30.00–100.00)

## 2015-06-14 MED ORDER — ONDANSETRON HCL 8 MG PO TABS: 8.0000 mg | ORAL_TABLET | Freq: Three times a day (TID) | ORAL | Status: DC | PRN

## 2015-06-14 MED ORDER — VITAMIN D (ERGOCALCIFEROL) 1.25 MG (50000 UNIT) PO CAPS: 50000.0000 [IU] | ORAL_CAPSULE | ORAL | Status: DC

## 2015-06-14 MED ORDER — PROCHLORPERAZINE MALEATE 10 MG PO TABS: 10.0000 mg | ORAL_TABLET | Freq: Four times a day (QID) | ORAL | Status: DC | PRN

## 2015-06-14 NOTE — Assessment & Plan Note (Signed)
Pt's PE WNL.  Overdue for mammo- ordered.  Due for pap next year.  UTD on colonoscopy.  Encouraged smoking cessation.  Check labs.  Anticipatory guidance provided.

## 2015-06-14 NOTE — Progress Notes (Signed)
   Subjective:    Patient ID: Angela Schmidt, female    DOB: 1960-09-05, 54 y.o.   MRN: [id number]  HPI CPE- UTD on colonoscopy (due for repeat 2019), due for pap next year, overdue for mammo.  Declines flu and Tdap.   Review of Systems Patient reports no vision/ hearing changes, adenopathy,fever, weight change,  persistant/recurrent hoarseness , swallowing issues, chest pain, palpitations, edema, persistant/recurrent cough, hemoptysis, dyspnea (rest/exertional/paroxysmal nocturnal), gastrointestinal bleeding (melena, rectal bleeding), abdominal pain, significant heartburn, bowel changes, GU symptoms (dysuria, hematuria, incontinence), Gyn symptoms (abnormal  bleeding, pain),  syncope, focal weakness, memory loss, numbness & tingling, skin/hair/nail changes, abnormal bruising or bleeding, anxiety, or depression.     Objective:   Physical Exam General Appearance:    Alert, cooperative, no distress, appears stated age  Head:    Normocephalic, without obvious abnormality, atraumatic  Eyes:    PERRL, conjunctiva/corneas clear, EOM's intact, fundi    benign, both eyes  Ears:    Normal TM's and external ear canals, both ears  Nose:   Nares normal, septum midline, mucosa normal, no drainage    or sinus tenderness  Throat:   Lips, mucosa, and tongue normal; teeth and gums normal  Neck:   Supple, symmetrical, trachea midline, no adenopathy;    Thyroid: no enlargement/tenderness/nodules  Back:     Symmetric, no curvature, ROM normal, no CVA tenderness  Lungs:     Clear to auscultation bilaterally, respirations unlabored  Chest Wall:    No tenderness or deformity   Heart:    Regular rate and rhythm, S1 and S2 normal, no murmur, rub   or gallop  Breast Exam:    Deferred to mammo  Abdomen:     Soft, non-tender, bowel sounds active all four quadrants,    no masses, no organomegaly  Genitalia:    Deferred to GYN  Rectal:    Extremities:   Extremities normal, atraumatic, no cyanosis or edema    Pulses:   2+ and symmetric all extremities  Skin:   Skin color, texture, turgor normal, no rashes or lesions  Lymph nodes:   Cervical, supraclavicular, and axillary nodes normal  Neurologic:   CNII-XII intact, normal strength, sensation and reflexes    throughout          Assessment & Plan:

## 2015-06-14 NOTE — Progress Notes (Signed)
Pre visit review using our clinic review tool, if applicable. No additional management support is needed unless otherwise documented below in the visit note. 

## 2015-06-14 NOTE — Assessment & Plan Note (Signed)
Pt has appt pending w/ Dr Oletta Lamas at [facility]

## 2015-06-14 NOTE — Patient Instructions (Signed)
Follow up in 1 year or as needed We'll notify you of your lab results and make any changes if needed Keep up the good work!  You look great! Make sure you are eating regularly We'll call you with you mammogram appt Call with any questions or concerns If you want to join [place] at the new [place] office, any scheduled appointments will automatically transfer and we will see you at [address] [place] [address] [place], [place] [address]  Happy Fall!!!!

## 2015-06-20 ENCOUNTER — Ambulatory Visit (HOSPITAL_BASED_OUTPATIENT_CLINIC_OR_DEPARTMENT_OTHER)
Admission: RE | Admit: 2015-06-20 | Discharge: 2015-06-20 | Disposition: A | Discharge status: Home / Self Care | Payer: BLUE CROSS/BLUE SHIELD | Source: Ambulatory Visit | Attending: Family Medicine | Admitting: Family Medicine

## 2015-06-20 DIAGNOSIS — Z0000 Encounter for general adult medical examination without abnormal findings: Secondary | ICD-10-CM

## 2015-06-20 DIAGNOSIS — Z1231 Encounter for screening mammogram for malignant neoplasm of breast: Secondary | ICD-10-CM | POA: Diagnosis present

## 2015-07-25 ENCOUNTER — Other Ambulatory Visit: Payer: Self-pay | Admitting: General Surgery

## 2015-07-25 DIAGNOSIS — K624 Stenosis of anus and rectum: Secondary | ICD-10-CM

## 2015-07-29 ENCOUNTER — Emergency Department (HOSPITAL_COMMUNITY): Payer: BLUE CROSS/BLUE SHIELD

## 2015-07-29 ENCOUNTER — Inpatient Hospital Stay (HOSPITAL_COMMUNITY)
Admission: EM | Admit: 2015-07-29 | Discharge: 2015-08-08 | DRG: 329 | Disposition: A | Discharge status: Home / Self Care | Payer: BLUE CROSS/BLUE SHIELD | Attending: General Surgery | Admitting: General Surgery

## 2015-07-29 ENCOUNTER — Encounter (HOSPITAL_COMMUNITY): Payer: Self-pay | Admitting: *Deleted

## 2015-07-29 DIAGNOSIS — Z7952 Long term (current) use of systemic steroids: Secondary | ICD-10-CM | POA: Diagnosis not present

## 2015-07-29 DIAGNOSIS — Z820 Family history of epilepsy and other diseases of the nervous system: Secondary | ICD-10-CM

## 2015-07-29 DIAGNOSIS — K5669 Other intestinal obstruction: Secondary | ICD-10-CM | POA: Diagnosis present

## 2015-07-29 DIAGNOSIS — Z87442 Personal history of urinary calculi: Secondary | ICD-10-CM | POA: Diagnosis not present

## 2015-07-29 DIAGNOSIS — E876 Hypokalemia: Secondary | ICD-10-CM | POA: Diagnosis present

## 2015-07-29 DIAGNOSIS — E43 Unspecified severe protein-calorie malnutrition: Secondary | ICD-10-CM | POA: Diagnosis present

## 2015-07-29 DIAGNOSIS — K6819 Other retroperitoneal abscess: Principal | ICD-10-CM | POA: Diagnosis present

## 2015-07-29 DIAGNOSIS — Z85048 Personal history of other malignant neoplasm of rectum, rectosigmoid junction, and anus: Secondary | ICD-10-CM

## 2015-07-29 DIAGNOSIS — Z79899 Other long term (current) drug therapy: Secondary | ICD-10-CM

## 2015-07-29 DIAGNOSIS — Z681 Body mass index (BMI) 19 or less, adult: Secondary | ICD-10-CM

## 2015-07-29 DIAGNOSIS — Z885 Allergy status to narcotic agent status: Secondary | ICD-10-CM | POA: Diagnosis not present

## 2015-07-29 DIAGNOSIS — Z833 Family history of diabetes mellitus: Secondary | ICD-10-CM

## 2015-07-29 DIAGNOSIS — M1990 Unspecified osteoarthritis, unspecified site: Secondary | ICD-10-CM | POA: Diagnosis present

## 2015-07-29 DIAGNOSIS — K5900 Constipation, unspecified: Secondary | ICD-10-CM | POA: Diagnosis present

## 2015-07-29 DIAGNOSIS — N739 Female pelvic inflammatory disease, unspecified: Secondary | ICD-10-CM | POA: Diagnosis present

## 2015-07-29 DIAGNOSIS — R1010 Upper abdominal pain, unspecified: Secondary | ICD-10-CM | POA: Diagnosis present

## 2015-07-29 DIAGNOSIS — F17210 Nicotine dependence, cigarettes, uncomplicated: Secondary | ICD-10-CM | POA: Diagnosis present

## 2015-07-29 LAB — CBC WITH DIFFERENTIAL/PLATELET
BASOS ABS: 0 10*3/uL (ref 0.0–0.1)
BASOS PCT: 0 %
Eosinophils Absolute: 0 10*3/uL (ref 0.0–0.7)
Eosinophils Relative: 0 %
HEMATOCRIT: 39.3 % (ref 36.0–46.0)
HEMOGLOBIN: 12.8 g/dL (ref 12.0–15.0)
Lymphocytes Relative: 10 %
Lymphs Abs: 1.1 10*3/uL (ref 0.7–4.0)
MCH: 29.4 pg (ref 26.0–34.0)
MCHC: 32.6 g/dL (ref 30.0–36.0)
MCV: 90.3 fL (ref 78.0–100.0)
MONOS PCT: 10 %
Monocytes Absolute: 1.1 10*3/uL — ABNORMAL HIGH (ref 0.1–1.0)
NEUTROS ABS: 8.3 10*3/uL — AB (ref 1.7–7.7)
NEUTROS PCT: 80 %
Platelets: 809 10*3/uL — ABNORMAL HIGH (ref 150–400)
RBC: 4.35 MIL/uL (ref 3.87–5.11)
RDW: 13.7 % (ref 11.5–15.5)
WBC MORPHOLOGY: INCREASED
WBC: 10.5 10*3/uL (ref 4.0–10.5)

## 2015-07-29 LAB — I-STAT CG4 LACTIC ACID, ED
Lactic Acid, Venous: 6.5 mmol/L (ref 0.5–2.0)
Lactic Acid, Venous: 2.59 mmol/L (ref 0.5–2.0)

## 2015-07-29 LAB — COMPREHENSIVE METABOLIC PANEL
ALBUMIN: 2.9 g/dL — AB (ref 3.5–5.0)
ALK PHOS: 152 U/L — AB (ref 38–126)
ALT: 20 U/L (ref 14–54)
ANION GAP: 17 — AB (ref 5–15)
AST: 25 U/L (ref 15–41)
BILIRUBIN TOTAL: 0.8 mg/dL (ref 0.3–1.2)
BUN: 11 mg/dL (ref 6–20)
CALCIUM: 9.3 mg/dL (ref 8.9–10.3)
CO2: 26 mmol/L (ref 22–32)
CREATININE: 0.81 mg/dL (ref 0.44–1.00)
Chloride: 91 mmol/L — ABNORMAL LOW (ref 101–111)
GFR calc Af Amer: >60 mL/min (ref >60)
GFR calc non Af Amer: >60 mL/min (ref >60)
GLUCOSE: 213 mg/dL — AB (ref 65–99)
Potassium: 3.3 mmol/L — ABNORMAL LOW (ref 3.5–5.1)
Sodium: 134 mmol/L — ABNORMAL LOW (ref 135–145)
TOTAL PROTEIN: 6.8 g/dL (ref 6.5–8.1)

## 2015-07-29 LAB — LIPASE, BLOOD: Lipase: 18 U/L (ref 11–51)

## 2015-07-29 MED ORDER — NALOXONE HCL 0.4 MG/ML IJ SOLN: 0.4000 mg | INTRAMUSCULAR | Status: DC | PRN

## 2015-07-29 MED ORDER — HYDROMORPHONE 1 MG/ML IV SOLN
INTRAVENOUS | Status: DC
  Administered 2015-07-29: 21:00:00 via INTRAVENOUS
  Administered 2015-07-30: 3.3 mg via INTRAVENOUS
  Administered 2015-07-30: 4.5 mg via INTRAVENOUS
  Administered 2015-07-30: 0.9 mg via INTRAVENOUS
  Administered 2015-07-30: 1.4 mg via INTRAVENOUS
  Administered 2015-07-30: 1.2 mg via INTRAVENOUS
  Administered 2015-07-30: 3.9 mg via INTRAVENOUS
  Administered 2015-07-31: 0.9 mg via INTRAVENOUS
  Administered 2015-07-31: 4.8 mg via INTRAVENOUS
  Administered 2015-07-31: 2.7 mg via INTRAVENOUS
  Administered 2015-07-31 (×2): 1.8 mg via INTRAVENOUS
  Administered 2015-07-31: 1.5 mg via INTRAVENOUS
  Administered 2015-07-31: 3 mg via INTRAVENOUS
  Administered 2015-08-01: 0.9 mg via INTRAVENOUS
  Administered 2015-08-01: 18:00:00 via INTRAVENOUS
  Administered 2015-08-01: 1.5 mg via INTRAVENOUS
  Administered 2015-08-01: 1.2 mg via INTRAVENOUS
  Administered 2015-08-01: 5 mg via INTRAVENOUS
  Administered 2015-08-01: 1.8 mg via INTRAVENOUS
  Administered 2015-08-02: 2.7 mg via INTRAVENOUS
  Administered 2015-08-02: 0.9 mg via INTRAVENOUS
  Administered 2015-08-02 (×2): 0.6 mg via INTRAVENOUS
  Administered 2015-08-02: 3.3 mg via INTRAVENOUS
  Administered 2015-08-03: 0.9 mg via INTRAVENOUS
  Administered 2015-08-03 (×2): 1.8 mg via INTRAVENOUS
  Administered 2015-08-03: 3.6 mg via INTRAVENOUS
  Administered 2015-08-03: 1.8 mg via INTRAVENOUS
  Administered 2015-08-04: 0 mg via INTRAVENOUS
  Administered 2015-08-04: 1.2 mg via INTRAVENOUS
  Administered 2015-08-04: 1.5 mg via INTRAVENOUS
  Administered 2015-08-04: 1.2 mg via INTRAVENOUS
  Administered 2015-08-04: 4.2 mg via INTRAVENOUS
  Administered 2015-08-04: 2.4 mg via INTRAVENOUS
  Administered 2015-08-04: 1.2 mg via INTRAVENOUS
  Administered 2015-08-05: 3 mg via INTRAVENOUS
  Administered 2015-08-05: 1.2 mg via INTRAVENOUS
  Administered 2015-08-05: 1.8 mg via INTRAVENOUS
  Administered 2015-08-05: 1.2 mg via INTRAVENOUS
  Administered 2015-08-05: 1.5 mg via INTRAVENOUS
  Administered 2015-08-06: 04:00:00 via INTRAVENOUS
  Administered 2015-08-06: 2.7 mg via INTRAVENOUS
  Administered 2015-08-06: 0.6 mg via INTRAVENOUS
  Administered 2015-08-06: 2.1 mg via INTRAVENOUS
  Administered 2015-08-06: 1.8 mg via INTRAVENOUS
  Administered 2015-08-06 (×2): 1.2 mg via INTRAVENOUS
  Administered 2015-08-07: 2.4 mg via INTRAVENOUS
  Administered 2015-08-07: 2.1 mg via INTRAVENOUS
  Filled 2015-07-29 (×5): qty 25

## 2015-07-29 MED ORDER — HYDROMORPHONE HCL 1 MG/ML IJ SOLN
1.0000 mg | Freq: Once | INTRAMUSCULAR | Status: AC
  Administered 2015-07-29: 1 mg via INTRAVENOUS
  Filled 2015-07-29: qty 1

## 2015-07-29 MED ORDER — IOHEXOL 300 MG/ML  SOLN
100.0000 mL | Freq: Once | INTRAMUSCULAR | Status: AC | PRN
  Administered 2015-07-29: 100 mL via INTRAVENOUS

## 2015-07-29 MED ORDER — INFLUENZA VAC SPLIT QUAD 0.5 ML IM SUSY
0.5000 mL | PREFILLED_SYRINGE | INTRAMUSCULAR | Status: AC
  Administered 2015-07-30: 0.5 mL via INTRAMUSCULAR
  Filled 2015-07-29 (×3): qty 0.5

## 2015-07-29 MED ORDER — SODIUM CHLORIDE 0.9 % IV SOLN
INTRAVENOUS | Status: DC
  Administered 2015-07-29 – 2015-07-30 (×2): via INTRAVENOUS

## 2015-07-29 MED ORDER — SODIUM CHLORIDE 0.9 % IV SOLN
1.0000 g | INTRAVENOUS | Status: AC
  Administered 2015-07-29 – 2015-08-05 (×8): 1 g via INTRAVENOUS
  Filled 2015-07-29 (×8): qty 1

## 2015-07-29 MED ORDER — ONDANSETRON HCL 4 MG/2ML IJ SOLN
4.0000 mg | Freq: Once | INTRAMUSCULAR | Status: AC
  Administered 2015-07-29: 4 mg via INTRAVENOUS
  Filled 2015-07-29: qty 2

## 2015-07-29 MED ORDER — SODIUM CHLORIDE 0.9 % IJ SOLN: 9.0000 mL | INTRAMUSCULAR | Status: DC | PRN

## 2015-07-29 MED ORDER — IOHEXOL 300 MG/ML  SOLN
25.0000 mL | Freq: Once | INTRAMUSCULAR | Status: AC | PRN
  Administered 2015-07-29: 25 mL via ORAL

## 2015-07-29 MED ORDER — ONDANSETRON HCL 4 MG/2ML IJ SOLN
4.0000 mg | Freq: Four times a day (QID) | INTRAMUSCULAR | Status: DC | PRN
  Administered 2015-07-29 – 2015-08-06 (×6): 4 mg via INTRAVENOUS
  Filled 2015-07-29 (×6): qty 2

## 2015-07-29 MED ORDER — DIPHENHYDRAMINE HCL 50 MG/ML IJ SOLN: 12.5000 mg | Freq: Four times a day (QID) | INTRAMUSCULAR | Status: DC | PRN

## 2015-07-29 MED ORDER — DIPHENHYDRAMINE HCL 12.5 MG/5ML PO ELIX: 12.5000 mg | ORAL_SOLUTION | Freq: Four times a day (QID) | ORAL | Status: DC | PRN

## 2015-07-29 MED ORDER — SODIUM CHLORIDE 0.9 % IV BOLUS (SEPSIS)
1000.0000 mL | Freq: Once | INTRAVENOUS | Status: AC
  Administered 2015-07-29: 1000 mL via INTRAVENOUS

## 2015-07-29 MED ORDER — NICOTINE 21 MG/24HR TD PT24
21.0000 mg | MEDICATED_PATCH | TRANSDERMAL | Status: DC
  Administered 2015-07-30 – 2015-08-08 (×10): 21 mg via TRANSDERMAL
  Filled 2015-07-29 (×10): qty 1

## 2015-07-29 MED ORDER — ENOXAPARIN SODIUM 40 MG/0.4ML ~~LOC~~ SOLN
40.0000 mg | SUBCUTANEOUS | Status: DC
  Administered 2015-07-29: 40 mg via SUBCUTANEOUS
  Filled 2015-07-29: qty 0.4

## 2015-07-29 NOTE — ED Notes (Signed)
Notified PA and nurse results from lactic acid

## 2015-07-29 NOTE — ED Provider Notes (Signed)
CSN: [id number]     Arrival date & time 07/29/15  1336 History   First MD Initiated Contact with Patient 07/29/15 1424     Chief Complaint  Patient presents with  . Abdominal Pain     (Consider location/radiation/quality/duration/timing/severity/associated sxs/prior Treatment) HPI   Patient to the ER with complaints of severe abdominal pain worse the the upper abdomen. She has a PMH of colon cancer with bowel resection. She has a known colon stricture. She has had an ileostomy and it reversed in 2015. She has never had a bowel obstruction but this past week she has had belly pain with constipation and diarrhea that has been worsening. Today she started to vomit and the pain became unbearable. She has seen her general surgeon for this problem and is scheduled for a CT of the abdomen this upcoming Thursday. The patient is in severe pain, ill looking, tachypnic, and pale.  Past Medical History  Diagnosis Date  . History of kidney stones   . Rectal stenosis   . S/P ileostomy ([place])   . 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 Arthritis     LUMBAR  . Wears contact lenses   . Complication of anesthesia     "shivers"   Past Surgical History  Procedure Laterality Date  . Bunionectomy Left 1984  . Eus N/A 02/25/2013    Procedure: LOWER ENDOSCOPIC ULTRASOUND (EUS);  Surgeon: Milus Banister, MD;  Location: [facility];  Service: Endoscopy;  Laterality: N/A;  radial  . Laparoscopic low anterior resection N/A 06/14/2013    Procedure: LAPAROSCOPIC LOW ANTERIOR RESECTION AND DIVERTING LAPAROSCOPIC ILEOSTOMY;  Surgeon: Leighton Ruff, MD;  Location: [facility];  Service: General;  Laterality: N/A;  with splenic flexure takdown  . Tubal ligation  1994  . Examination under anesthesia N/A 07/21/2013    Procedure: EXAM UNDER  ANESTHESIA/ RECTAL DILITATION;  Surgeon: Leighton Ruff, MD;  Location: [facility];  Service: General;  Laterality: N/A;  . Flexible sigmoidoscopy N/A 07/21/2013    Procedure: FLEXIBLE SIGMOIDOSCOPY;  Surgeon: Leighton Ruff, MD;  Location: [facility];  Service: General;  Laterality: N/A;  . Colostomy reversal N/A 11/18/2013    Procedure: LOOP ILEOSTOMY CLOSURE, FLEXIBLE SIGMOIDOSCOPY;  Surgeon: Leighton Ruff, MD;  Location: [facility];  Service: General;  Laterality: N/A;  . Flexible sigmoidoscopy N/A 11/18/2013    Procedure: FLEXIBLE SIGMOIDOSCOPY;  Surgeon: Leighton Ruff, MD;  Location: [facility];  Service: General;  Laterality: N/A;   Family History  Problem Relation Age of Onset  . Arthritis Mother   . Diabetes Mother   . Thyroid disease Mother   . Arthritis Father   . Diabetes Father   . Parkinson's disease Father   . Colon cancer Neg Hx    Social History  Substance Use Topics  . Smoking status: Current Some Day Smoker -- 30 years    Types: Cigarettes  . Smokeless tobacco: Never Used     Comment: 9 cigarettes/ per day. cut back from 2packs per day   . Alcohol Use: No   OB History    No data available     Review of Systems   Review of Systems  Gen: no weight loss, fevers, chills, night sweats  Eyes: no occular draining, occular pain,  No visual changes  Nose: no epistaxis or  rhinorrhea  Mouth: no dental pain, no sore throat  Neck: no neck pain  Lungs: No hemoptysis. No wheezing or coughing CV:  No palpitations, dependent edema or orthopnea. No chest pain Abd: + diarrhea. No nausea or vomiting, No abdominal pain  GU: no dysuria or gross hematuria  MSK:  No muscle weakness, No muscular pain Neuro: no headache, no focal neurologic deficits  Skin: no rash , no wounds Psyche: no complaints of depression or anxiety    Allergies  Demerol  Home Medications   Prior to Admission medications   Medication Sig Start Date End Date Taking? Authorizing  Provider  docusate sodium (COLACE) 100 MG capsule Take 100 mg by mouth daily as needed for mild constipation.   Yes Historical Provider, MD  ibuprofen (ADVIL,MOTRIN) 200 MG tablet Take 400 mg by mouth every 6 (six) hours as needed (Pain).    Yes Historical Provider, MD  Multiple Vitamin (MULTIVITAMIN WITH MINERALS) TABS tablet Take 1 tablet by mouth daily.   Yes Historical Provider, MD  nicotine (NICODERM CQ - DOSED IN MG/24 HOURS) 21 mg/24hr patch Place 1 patch onto the skin daily. 04/04/13  Yes Historical Provider, MD  nystatin ointment (MYCOSTATIN) Apply 1 application topically 2 (two) times daily. 04/04/15  Yes Midge Minium, MD  ondansetron (ZOFRAN) 8 MG tablet Take 1 tablet (8 mg total) by mouth every 8 (eight) hours as needed for nausea. 06/14/15  Yes Midge Minium, MD  polyethylene glycol powder (GLYCOLAX/MIRALAX) powder TAKE 17 G BY MOUTH DAILY. 03/14/15  Yes Historical Provider, MD  prochlorperazine (COMPAZINE) 10 MG tablet Take 1 tablet (10 mg total) by mouth every 6 (six) hours as needed (nausea). 06/14/15  Yes Midge Minium, MD  traMADol (ULTRAM) 50 MG tablet Take 1 tablet (50 mg total) by mouth every 6 (six) hours as needed for pain. [id number]  Yes Leighton Ruff, MD  Vitamin D, Ergocalciferol, (DRISDOL) 50000 UNITS CAPS capsule Take 1 capsule (50,000 Units total) by mouth every 7 (seven) days. 06/14/15  Yes Midge Minium, MD  predniSONE (STERAPRED UNI-PAK 21 TAB) 10 MG (21) TBPK tablet Take 1-2 tablets by mouth See admin instructions. As directed on package 07/17/15   Historical Provider, MD   BP 116/54 mmHg  Pulse 75  Temp(Src) 98.6 F (37 C) (Rectal)  Resp 24  SpO2 100% Physical Exam  Constitutional: She appears well-developed and well-nourished. She appears ill. She appears distressed.  pale  HENT:  Head: Normocephalic and atraumatic.  Eyes: Pupils are equal, round, and reactive to light.  Neck: Normal range of motion. Neck supple.  Cardiovascular: Normal  rate and regular rhythm.   Pulmonary/Chest: Effort normal. Tachypnea noted.  Abdominal: Soft. There is tenderness in the right upper quadrant, epigastric area, periumbilical area and left upper quadrant. There is rigidity and guarding. There is no rebound and no CVA tenderness.  Neurological: She is alert.  Skin: Skin is warm and dry.  Nursing note and vitals reviewed.   ED Course  Procedures (including critical care time) Labs Review Labs Reviewed  I-STAT CG4 LACTIC ACID, ED - Abnormal; Notable for the following:    Lactic Acid, Venous 6.50 (*)    All other components within normal limits  CBC WITH DIFFERENTIAL/PLATELET  COMPREHENSIVE METABOLIC PANEL  LIPASE, BLOOD    Imaging Review No results found. I have personally reviewed and evaluated these images and lab results as part of my medical decision-making.   EKG Interpretation None      MDM  Final diagnoses:  None   Dr. Winfred Leeds has seen the patient as well. Concerned for surgical abdomen. 3: 45 pm- Pt sign out to [employer], PA-C and Blanchie Dessert, MD at end of shift.  Fluids and pain medication ordered as well as acute abd series.  Filed Vitals:   07/29/15 1411 07/29/15 1533  BP: 133/68 116/54  Pulse: 73 75  Temp: 97.8 F (36.6 C) 98.6 F (37 C)  Resp: 20 [id number], PA-C 07/29/15 Rogers, MD 07/29/15 1656

## 2015-07-29 NOTE — ED Notes (Signed)
Pt wants pain meds before blood draw

## 2015-07-29 NOTE — ED Provider Notes (Signed)
Complains of upper abdominal pain gradual onset pressing one week ago becoming worse within the past 3 hours. She's had vomiting onset this afternoon. On exam patient is ill-appearing alert Glasgow Coma Score 15 lungs clear to auscultation heart regular rate and rhythm abdomen surgical scar horizontal, across umbilicus, bowel sounds absent, diffuse tenderness with guarding   Orlie Dakin, MD 07/29/15 1643

## 2015-07-29 NOTE — H&P (Signed)
Angela Schmidt is an 54 y.o. female.   Chief Complaint: abd pain HPI: 54 y.o. F with h/o rectal cancer.  S/P LAR with coloanal anastomosis and diverting ileostomy.  Closure delayed due to anastomotic disruption posteriorly.  Now with several months of obstructive symptoms and worsening upper abd pain.  Came in tonight with worsening of pain.  No fevers.  Some nausea noted.  No appetite.    Past Medical History  Diagnosis Date  . History of kidney stones   . Rectal stenosis   . S/P ileostomy ([place])   . 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 Arthritis     LUMBAR  . Wears contact lenses   . Complication of anesthesia     "shivers"    Past Surgical History  Procedure Laterality Date  . Bunionectomy Left 1984  . Eus N/A 02/25/2013    Procedure: LOWER ENDOSCOPIC ULTRASOUND (EUS);  Surgeon: Milus Banister, MD;  Location: [facility];  Service: Endoscopy;  Laterality: N/A;  radial  . Laparoscopic low anterior resection N/A 06/14/2013    Procedure: LAPAROSCOPIC LOW ANTERIOR RESECTION AND DIVERTING LAPAROSCOPIC ILEOSTOMY;  Surgeon: Leighton Ruff, MD;  Location: [facility];  Service: General;  Laterality: N/A;  with splenic flexure takdown  . Tubal ligation  1994  . Examination under anesthesia N/A 07/21/2013    Procedure: EXAM UNDER ANESTHESIA/ RECTAL DILITATION;  Surgeon: Leighton Ruff, MD;  Location[facility];  Service: General;  Laterality: N/A;  . Flexible sigmoidoscopy N/A 07/21/2013    Procedure: FLEXIBLE SIGMOIDOSCOPY;  Surgeon: Leighton Ruff, MD;  Location: [facility];  Service: General;  Laterality: N/A;  . Colostomy reversal N/A 11/18/2013    Procedure: LOOP ILEOSTOMY CLOSURE, FLEXIBLE SIGMOIDOSCOPY;  Surgeon: Leighton Ruff, MD;  Location: [facility];  Service: General;  Laterality: N/A;  .  Flexible sigmoidoscopy N/A 11/18/2013    Procedure: FLEXIBLE SIGMOIDOSCOPY;  Surgeon: Leighton Ruff, MD;  Location: [facility];  Service: General;  Laterality: N/A;    Family History  Problem Relation Age of Onset  . Arthritis Mother   . Diabetes Mother   . Thyroid disease Mother   . Arthritis Father   . Diabetes Father   . Parkinson's disease Father   . Colon cancer Neg Hx    Social History:  reports that she has been smoking Cigarettes.  She has smoked for the past 30 years. She has never used smokeless tobacco. She reports that she does not drink alcohol or use illicit drugs.  Allergies:  Allergies  Allergen Reactions  . Demerol [Meperidine] Nausea Only     (Not in a hospital admission)  Results for orders placed or performed during the hospital encounter of 07/29/15 (from the past 48 hour(s))  CBC with Differential/Platelet     Status: Abnormal   Collection Time: 07/29/15  2:35 PM  Result Value Ref Range   WBC 10.5 4.0 - 10.5 K/uL   RBC 4.35 3.87 - 5.11 MIL/uL   Hemoglobin 12.8 12.0 - 15.0 g/dL   HCT 39.3 36.0 - 46.0 %   MCV 90.3 78.0 - 100.0 fL   MCH 29.4 26.0 - 34.0 pg   MCHC 32.6 30.0 - 36.0 g/dL   RDW 13.7 11.5 - 15.5 %   Platelets 809 (H) 150 - 400 K/uL   Neutrophils Relative % 80 %  Lymphocytes Relative 10 %   Monocytes Relative 10 %   Eosinophils Relative 0 %   Basophils Relative 0 %   Neutro Abs 8.3 (H) 1.7 - 7.7 K/uL   Lymphs Abs 1.1 0.7 - 4.0 K/uL   Monocytes Absolute 1.1 (H) 0.1 - 1.0 K/uL   Eosinophils Absolute 0.0 0.0 - 0.7 K/uL   Basophils Absolute 0.0 0.0 - 0.1 K/uL   WBC Morphology INCREASED BANDS (>20% BANDS)   Comprehensive metabolic panel     Status: Abnormal   Collection Time: 07/29/15  2:35 PM  Result Value Ref Range   Sodium 134 (L) 135 - 145 mmol/L   Potassium 3.3 (L) 3.5 - 5.1 mmol/L   Chloride 91 (L) 101 - 111 mmol/L   CO2 26 22 - 32 mmol/L   Glucose, Bld 213 (H) 65 - 99 mg/dL   BUN 11 6 - 20 mg/dL   Creatinine, Ser 0.81 0.44 - 1.00  mg/dL   Calcium 9.3 8.9 - 10.3 mg/dL   Total Protein 6.8 6.5 - 8.1 g/dL   Albumin 2.9 (L) 3.5 - 5.0 g/dL   AST 25 15 - 41 U/L   ALT 20 14 - 54 U/L   Alkaline Phosphatase 152 (H) 38 - 126 U/L   Total Bilirubin 0.8 0.3 - 1.2 mg/dL   GFR calc non Af Amer >60 >60 mL/min   GFR calc Af Amer >60 >60 mL/min    Comment: (NOTE) The eGFR has been calculated using the CKD EPI equation. This calculation has not been validated in all clinical situations. eGFR's persistently <60 mL/min signify possible Chronic Kidney Disease.    Anion gap 17 (H) 5 - 15  Lipase, blood     Status: None   Collection Time: 07/29/15  2:35 PM  Result Value Ref Range   Lipase 18 11 - 51 U/L  I-Stat CG4 Lactic Acid, ED     Status: Abnormal   Collection Time: 07/29/15  3:20 PM  Result Value Ref Range   Lactic Acid, Venous 6.50 (HH) 0.5 - 2.0 mmol/L   Comment NOTIFIED PHYSICIAN   I-Stat CG4 Lactic Acid, ED     Status: Abnormal   Collection Time: 07/29/15  7:17 PM  Result Value Ref Range   Lactic Acid, Venous 2.59 (HH) 0.5 - 2.0 mmol/L   Comment NOTIFIED PHYSICIAN    Ct Abdomen Pelvis W Contrast  07/29/2015  CLINICAL DATA:  Severe abdominal pain particularly in the upper abdomen, personal history of colon cancer, bowel resection, rectal stenosis, ileostomy in 2014; severe abdominal pain with nausea and vomiting EXAM: CT ABDOMEN AND PELVIS WITH CONTRAST TECHNIQUE: Multidetector CT imaging of the abdomen and pelvis was performed using the standard protocol following bolus administration of intravenous contrast. CONTRAST:  171m OMNIPAQUE IOHEXOL 300 MG/ML  SOLN COMPARISON:  02/23/2014, 07/29/2015 FINDINGS: Lower chest:  Clear Hepatobiliary: 1 cm calcified gallstone. Mild periportal edema throughout the liver. Pancreas: Normal Spleen: Normal Adrenals/Urinary Tract: Adrenal glands are normal. There is left renal atrophy. This is stable. Bladder is normal. Stomach/Bowel: Small bowel is distended to nearly 4cm. There is a large  volume of stool in the cecum, and also in the distal large bowel. There is the suggestion of a swirl sign in the mid abdomen, as can be seen with volvulus, although the appearance is equivocal particularly in this postoperative patient. In the presacral region, there is fascial thickening. The rectal wall is thickened. There is extraluminal air to the left and right of the rectum, and  there is a tract, filled with stool and air, extending from the rectum inferolaterally through the posterior pelvis to terminate posterior to theleft femoral neck. Vascular/Lymphatic: Mild calcification of the abdominal aorta. Mild calcification of the iliac arteries. No acute vascular abnormalities. Small retroperitoneal periaortic lymph nodes similar to prior study. Reproductive: negative Other: None Musculoskeletal: No acute findings. IMPRESSION: 1. Although not definitive, the findings are concerning for a mild small bowel obstruction 2. There is inflammatory change involving the rectum in the presacral region, with evidence of breakdown of the large bowel wall. There is extraluminal air in the area, and a fistulous tract as described above. 3.  Other nonacute findings as described above. Critical Value/emergent results were called by telephone at the time of interpretation on 07/29/2015 at 5:58 pm to Dr. Maryan Rued, who verbally acknowledged these results. Electronically Signed   By: Skipper Cliche M.D.   On: 07/29/2015 17:59   Dg Abd Acute W/chest  07/29/2015  CLINICAL DATA:  One day history abdominal pain. History of colon carcinoma EXAM: DG ABDOMEN ACUTE W/ 1V CHEST COMPARISON:  Chest CT February 23, 2014; CT abdomen and pelvis February 23, 2014 FINDINGS: PA chest: There is underlying emphysematous change. There is no demonstrable edema or consolidation. Heart size and pulmonary vascularity within normal limits. No adenopathy apparent. Supine and left lateral decubitus abdomen: There is mild generalized bowel dilatation without  demonstrable air-fluid levels. There is moderate stool in the colon. There is no evident free air. No abnormal calcifications. IMPRESSION: Bowel gas pattern raises question of a degree of ileus. No obstruction or free air is appreciable. Underlying emphysematous changes present. There is no lung edema or consolidation apparent. Electronically Signed   By: Lowella Grip III M.D.   On: 07/29/2015 16:10    Review of Systems  Constitutional: Positive for weight loss. Negative for fever and chills.  HENT: Negative for congestion and sore throat.   Eyes: Negative for blurred vision.  Respiratory: Negative for cough, sputum production and shortness of breath.   Cardiovascular: Negative for chest pain.  Gastrointestinal: Positive for nausea, abdominal pain and diarrhea. Negative for vomiting and blood in stool.  Genitourinary: Negative for dysuria, urgency and frequency.  Skin: Negative for rash.  Neurological: Negative for dizziness and headaches.    Blood pressure 142/85, pulse 89, temperature 98.6 F (37 C), temperature source Rectal, resp. rate 20, SpO2 98 %. Physical Exam  Constitutional: She is oriented to person, place, and time. She appears well-developed and well-nourished. No distress.  HENT:  Head: Normocephalic and atraumatic.  Eyes: Conjunctivae and EOM are normal. Pupils are equal, round, and reactive to light.  Neck: Normal range of motion.  Cardiovascular: Normal rate and regular rhythm.   Respiratory: Effort normal and breath sounds normal. No respiratory distress.  GI: Soft. She exhibits distension. There is no tenderness. There is no rebound and no guarding.  Neurological: She is alert and oriented to person, place, and time.  Skin: Skin is warm and dry. She is not diaphoretic.     Assessment/Plan 54 y.o. F with a h/o rectal cancer being treated for coloanal stricture.  Now with new findings of pelvic abscess and fistulization towards the hip.  This is concerning for  possible recurrence.  I have recommended that she be admitted and placed on IV antibiotics.  We will keep her NPO and place her on a dilaudid PCA for pain.  I think we will need to place a diverting colostomy and wash out her pelvis in  the next few days.  We will need to biopsy this abscess cavity and r/o recurrence.  THOMAS, ALICIA C. 99/35/7017, 7:35 PM

## 2015-07-29 NOTE — ED Provider Notes (Signed)
  Pt signed out at shift change by Delos Haring PA-C at shift change pending CT abdomen. Pt is a 46 YOF with a history of colon cancer  With bowel resection and known stricture. Ileostomy reversal in 2015. Pain free until 1 week ago, worsened over the last week with diarrhea over the last several days, worsening pain over last two days and severe pain starting late morning. Also reports n/v; denies fever. Pt appears uncomfortable on exam with hypoactive bowel sounds, distention, with abdominal guarding. Pt received intial dose of diluadid with no improvement in pain second dose ordered. DG acute abdomen ordered radiology made aware that this patient is ill and need stat imagining.   Imaging shows mild small bowel obstruction, Percodan of large bowel wall with extraluminal air and fistulous tract. Surgery consulted who will be admitting the patient for IV antibiotics and surgery. Patient remained stable here in the ED  Okey Regal, PA-C 07/29/15 2358  Blanchie Dessert, MD 07/30/15 [phone]

## 2015-07-30 LAB — CBC
HCT: 29.2 % — ABNORMAL LOW (ref 36.0–46.0)
Hemoglobin: 9.5 g/dL — ABNORMAL LOW (ref 12.0–15.0)
MCH: 29.3 pg (ref 26.0–34.0)
MCHC: 32.5 g/dL (ref 30.0–36.0)
MCV: 90.1 fL (ref 78.0–100.0)
PLATELETS: 619 10*3/uL — AB (ref 150–400)
RBC: 3.24 MIL/uL — ABNORMAL LOW (ref 3.87–5.11)
RDW: 13.8 % (ref 11.5–15.5)
WBC: 9.4 10*3/uL (ref 4.0–10.5)

## 2015-07-30 LAB — BASIC METABOLIC PANEL
Anion gap: 10 (ref 5–15)
BUN: 7 mg/dL (ref 6–20)
CALCIUM: 7.6 mg/dL — AB (ref 8.9–10.3)
CHLORIDE: 101 mmol/L (ref 101–111)
CO2: 26 mmol/L (ref 22–32)
CREATININE: 0.58 mg/dL (ref 0.44–1.00)
GFR calc Af Amer: >60 mL/min (ref >60)
GFR calc non Af Amer: >60 mL/min (ref >60)
GLUCOSE: 95 mg/dL (ref 65–99)
Potassium: 3 mmol/L — ABNORMAL LOW (ref 3.5–5.1)
Sodium: 137 mmol/L (ref 135–145)

## 2015-07-30 MED ORDER — KCL IN DEXTROSE-NACL 20-5-0.45 MEQ/L-%-% IV SOLN
INTRAVENOUS | Status: DC
  Administered 2015-07-30 – 2015-08-06 (×14): via INTRAVENOUS
  Filled 2015-07-30 (×20): qty 1000

## 2015-07-30 MED ORDER — ENOXAPARIN SODIUM 30 MG/0.3ML ~~LOC~~ SOLN
30.0000 mg | SUBCUTANEOUS | Status: DC
  Administered 2015-07-30 – 2015-08-07 (×7): 30 mg via SUBCUTANEOUS
  Filled 2015-07-30 (×7): qty 0.3

## 2015-07-30 MED ORDER — POTASSIUM CHLORIDE CRYS ER 20 MEQ PO TBCR
40.0000 meq | EXTENDED_RELEASE_TABLET | Freq: Once | ORAL | Status: AC
  Administered 2015-07-30: 40 meq via ORAL
  Filled 2015-07-30: qty 2

## 2015-07-30 NOTE — Progress Notes (Signed)
Pelvic abscess in female  Subjective: Pt getting good pain relief with PCA and heating pad, no nausea, had a BM this am  Objective: Vital signs in last 24 hours: Temp:  [97.8 F (36.6 C)-98.6 F (37 C)] 98.5 F (36.9 C) (12/11 0429) Pulse Rate:  [63-89] 87 (12/11 0429) Resp:  [12-24] 14 (12/11 0823) BP: (116-149)/(54-85) 122/70 mmHg (12/11 0429) SpO2:  [96 %-100 %] 97 % (12/11 0823) Weight:  [42.865 kg (94 lb 8 oz)] 42.865 kg (94 lb 8 oz) (12/10 2039)    Intake/Output from previous day: 12/10 0701 - 12/11 0700 In: -  Out: 2 [Urine:1; Stool:1] Intake/Output this shift:    General appearance: alert and cooperative Cardio: regular rate and rhythm GI: normal findings: soft, non-tender, less distended Lab Results:  Results for orders placed or performed during the hospital encounter of 07/29/15 (from the past 24 hour(s))  CBC with Differential/Platelet     Status: Abnormal   Collection Time: 07/29/15  2:35 PM  Result Value Ref Range   WBC 10.5 4.0 - 10.5 K/uL   RBC 4.35 3.87 - 5.11 MIL/uL   Hemoglobin 12.8 12.0 - 15.0 g/dL   HCT 39.3 36.0 - 46.0 %   MCV 90.3 78.0 - 100.0 fL   MCH 29.4 26.0 - 34.0 pg   MCHC 32.6 30.0 - 36.0 g/dL   RDW 13.7 11.5 - 15.5 %   Platelets 809 (H) 150 - 400 K/uL   Neutrophils Relative % 80 %   Lymphocytes Relative 10 %   Monocytes Relative 10 %   Eosinophils Relative 0 %   Basophils Relative 0 %   Neutro Abs 8.3 (H) 1.7 - 7.7 K/uL   Lymphs Abs 1.1 0.7 - 4.0 K/uL   Monocytes Absolute 1.1 (H) 0.1 - 1.0 K/uL   Eosinophils Absolute 0.0 0.0 - 0.7 K/uL   Basophils Absolute 0.0 0.0 - 0.1 K/uL   WBC Morphology INCREASED BANDS (>20% BANDS)   Comprehensive metabolic panel     Status: Abnormal   Collection Time: 07/29/15  2:35 PM  Result Value Ref Range   Sodium 134 (L) 135 - 145 mmol/L   Potassium 3.3 (L) 3.5 - 5.1 mmol/L   Chloride 91 (L) 101 - 111 mmol/L   CO2 26 22 - 32 mmol/L   Glucose, Bld 213 (H) 65 - 99 mg/dL   BUN 11 6 - 20 mg/dL   Creatinine, Ser 0.81 0.44 - 1.00 mg/dL   Calcium 9.3 8.9 - 10.3 mg/dL   Total Protein 6.8 6.5 - 8.1 g/dL   Albumin 2.9 (L) 3.5 - 5.0 g/dL   AST 25 15 - 41 U/L   ALT 20 14 - 54 U/L   Alkaline Phosphatase 152 (H) 38 - 126 U/L   Total Bilirubin 0.8 0.3 - 1.2 mg/dL   GFR calc non Af Amer >60 >60 mL/min   GFR calc Af Amer >60 >60 mL/min   Anion gap 17 (H) 5 - 15  Lipase, blood     Status: None   Collection Time: 07/29/15  2:35 PM  Result Value Ref Range   Lipase 18 11 - 51 U/L  I-Stat CG4 Lactic Acid, ED     Status: Abnormal   Collection Time: 07/29/15  3:20 PM  Result Value Ref Range   Lactic Acid, Venous 6.50 (HH) 0.5 - 2.0 mmol/L   Comment NOTIFIED PHYSICIAN   I-Stat CG4 Lactic Acid, ED     Status: Abnormal   Collection Time: 07/29/15  7:17  PM  Result Value Ref Range   Lactic Acid, Venous 2.59 (HH) 0.5 - 2.0 mmol/L   Comment NOTIFIED PHYSICIAN   Basic metabolic panel     Status: Abnormal   Collection Time: 07/30/15  4:11 AM  Result Value Ref Range   Sodium 137 135 - 145 mmol/L   Potassium 3.0 (L) 3.5 - 5.1 mmol/L   Chloride 101 101 - 111 mmol/L   CO2 26 22 - 32 mmol/L   Glucose, Bld 95 65 - 99 mg/dL   BUN 7 6 - 20 mg/dL   Creatinine, Ser 0.58 0.44 - 1.00 mg/dL   Calcium 7.6 (L) 8.9 - 10.3 mg/dL   GFR calc non Af Amer >60 >60 mL/min   GFR calc Af Amer >60 >60 mL/min   Anion gap 10 5 - 15  CBC     Status: Abnormal   Collection Time: 07/30/15  4:11 AM  Result Value Ref Range   WBC 9.4 4.0 - 10.5 K/uL   RBC 3.24 (L) 3.87 - 5.11 MIL/uL   Hemoglobin 9.5 (L) 12.0 - 15.0 g/dL   HCT 29.2 (L) 36.0 - 46.0 %   MCV 90.1 78.0 - 100.0 fL   MCH 29.3 26.0 - 34.0 pg   MCHC 32.5 30.0 - 36.0 g/dL   RDW 13.8 11.5 - 15.5 %   Platelets 619 (H) 150 - 400 K/uL     Studies/Results Radiology     [facility], Scheduled . enoxaparin (LOVENOX) injection  30 mg Subcutaneous Q24H  . ertapenem  1 g Intravenous Q24H  . HYDROmorphone   Intravenous 6 times per day  . nicotine  21 mg Transdermal  Q24H     Assessment: Pelvic abscess in female H/o rectal cancer  Plan: Cont Invanz for pelvic abscess, wbc decreasing Cont PCA for pain control Will try some clears since she is having some bowel function Ostomy RN to mark LLQ for colostomy Hypokalemia: replace K PO Rectal cancer: Will get CT chest tom to eval for metastatic disease, CEA in office was 1.8   Potentially plan for OR Tues for pelvic washout and diverting stoma    LOS: 1 day    Rosario Adie, MD [facility], [place]   07/30/2015 11:52 AM

## 2015-07-31 ENCOUNTER — Encounter (HOSPITAL_COMMUNITY): Payer: Self-pay | Admitting: Radiology

## 2015-07-31 ENCOUNTER — Inpatient Hospital Stay (HOSPITAL_COMMUNITY): Payer: BLUE CROSS/BLUE SHIELD

## 2015-07-31 LAB — SURGICAL PCR SCREEN
MRSA, PCR: NEGATIVE
Staphylococcus aureus: POSITIVE — AB

## 2015-07-31 MED ORDER — CHLORHEXIDINE GLUCONATE CLOTH 2 % EX PADS
6.0000 | MEDICATED_PAD | Freq: Every day | CUTANEOUS | Status: DC
  Administered 2015-07-31 – 2015-08-01 (×2): 6 via TOPICAL

## 2015-07-31 MED ORDER — MUPIROCIN 2 % EX OINT
1.0000 "application " | TOPICAL_OINTMENT | Freq: Two times a day (BID) | CUTANEOUS | Status: AC
  Administered 2015-07-31 – 2015-08-05 (×8): 1 via NASAL
  Filled 2015-07-31 (×5): qty 22

## 2015-07-31 MED ORDER — IOHEXOL 300 MG/ML  SOLN
75.0000 mL | Freq: Once | INTRAMUSCULAR | Status: AC | PRN
  Administered 2015-07-31: 75 mL via INTRAVENOUS

## 2015-07-31 NOTE — Consult Note (Signed)
WOC ostomy consult note Patient seen per Dr. Marcello Moores' request for assessment of abdomen and selection of a LLQ colostomy site.  Patient's abdomen assessed in the sitting, standing and lying positions. Patient had an ileostomy in the past on the RLQ. She reports skin irritation with ostomy products, so her routine will be closely assessed post operatively.  Site selected is in the LLQ, 6cm to the left of the umbilicus and [id number] below.  The skin is cleansed with a CHG wipe x2 and allowed to dry. The site is marked with a surgical skin marking pen and covered with a thin film transparent dressing. Patient understands the role of the [place] nurse in the post op period. [place] nursing team will follow, and will remain available to this patient, the nursing, surgical and medical teams.   Thanks, Maudie Flakes, MSN, RN, [place], Arther Abbott  Pager# [phone]

## 2015-07-31 NOTE — Progress Notes (Signed)
Pelvic abscess in female  Subjective: Pt getting good pain relief with PCA and heating pad, no nausea, had a BM this am  Objective: Vital signs in last 24 hours: Temp:  [98.2 F (36.8 C)-100.5 F (38.1 C)] 98.5 F (36.9 C) (12/12 0444) Pulse Rate:  [94-95] 94 (12/12 0444) Resp:  [11-20] 17 (12/12 0812) BP: (123-132)/(69-81) 132/75 mmHg (12/12 0444) SpO2:  [94 %-99 %] 97 % (12/12 0812) Last BM Date: 07/30/15  Intake/Output from previous day: 12/11 0701 - 12/12 0700 In: 562 [P.O.:562] Out: 2 [Urine:2] Intake/Output this shift:    General appearance: alert and cooperative Cardio: regular rate and rhythm GI: normal findings: soft, non-tender, less distended Lab Results:  No results found for this or any previous visit (from the past 24 hour(s)).   Studies/Results Radiology     MEDS, Scheduled . enoxaparin (LOVENOX) injection  30 mg Subcutaneous Q24H  . ertapenem  1 g Intravenous Q24H  . HYDROmorphone   Intravenous 6 times per day  . nicotine  21 mg Transdermal Q24H     Assessment: Pelvic abscess in female H/o rectal cancer  Plan: Cont Invanz for pelvic abscess Cont PCA for pain control Cont clears since she is having some bowel function Ostomy RN to mark LLQ for colostomy Hypokalemia: replace K PO, will recheck in AM Rectal cancer: Will get CT chest tom to eval for metastatic disease, CEA in office was 1.8   Potentially plan for OR Tues for pelvic washout and diverting stoma    LOS: 2 days    Rosario Adie, MD [facility], [place]   07/31/2015 8:40 AM

## 2015-07-31 NOTE — Progress Notes (Signed)
Initial Nutrition Assessment  DOCUMENTATION CODES:   Severe malnutrition in context of chronic illness, Underweight  INTERVENTION:   Diet advancement per MD Once diet is advance after surgery, will discuss nutritional supplement options with patient Patient requesting ostomy diet education prior to discharge  RD to continue to monitor for education and supplement needs   NUTRITION DIAGNOSIS:   Malnutrition related to chronic illness as evidenced by percent weight loss, severe depletion of body fat, severe depletion of muscle mass.  GOAL:   Patient will meet greater than or equal to 90% of their needs  MONITOR:   PO intake, Diet advancement, Labs, Weight trends, Skin, I & O's  REASON FOR ASSESSMENT:   Malnutrition Screening Tool, Other (Comment) (Low BMI)    ASSESSMENT:   54 y.o. F with h/o rectal cancer. S/P LAR with coloanal anastomosis and diverting ileostomy. Closure delayed due to anastomotic disruption posteriorly. Now with several months of obstructive symptoms and worsening upper abd pain. Came in tonight with worsening of pain. No fevers. Some nausea noted. No appetite.  Patient in room with daughter at bedside. Pt reports N/V x 1 day PTA but with consistent nausea for a long time. Pt currently eating 75% of clear liquids. Pt is NPO after midnight for surgery tomorrow (diverting colostomy). Pt requests RD provide some diet education for ostomy. RD to follow-up prior to discharge.  Once diet is advanced, pt would benefit from nutritional supplements.  Pt has lost 7 lb since 10/26 (7% weight loss x 1.5 months, significant for time frame).  Nutrition-Focused physical exam completed. Findings are severe fat depletion, severe muscle depletion, and no edema.   Labs reviewed: Low K  Diet Order:  Diet clear liquid Room service appropriate?: Yes; Fluid consistency:: Thin Diet NPO time specified Except for: Sips with Meds  Skin:  Reviewed, no issues  Last BM:   12/11  Height:   Ht Readings from Last 1 Encounters:  07/29/15 5\' 3"  (1.6 m)    Weight:   Wt Readings from Last 1 Encounters:  07/29/15 94 lb 8 oz (42.865 kg)    Ideal Body Weight:  52.3 kg  BMI:  Body mass index is 16.74 kg/(m^2).  Estimated Nutritional Needs:   Kcal:  1400-1600  Protein:  65-75g  Fluid:  1.5L/day  EDUCATION NEEDS:   Education needs addressed  Clayton Bibles, MS, RD, LDN Pager: [phone] After Hours Pager: [phone]

## 2015-08-01 ENCOUNTER — Encounter (HOSPITAL_COMMUNITY): Payer: Self-pay | Admitting: Anesthesiology

## 2015-08-01 ENCOUNTER — Inpatient Hospital Stay (HOSPITAL_COMMUNITY): Payer: BLUE CROSS/BLUE SHIELD | Admitting: Anesthesiology

## 2015-08-01 ENCOUNTER — Encounter (HOSPITAL_COMMUNITY)
Admission: EM | Disposition: A | Discharge status: Home / Self Care | Payer: Self-pay | Source: Home / Self Care | Attending: General Surgery

## 2015-08-01 HISTORY — PX: LAPAROSCOPY: SHX197

## 2015-08-01 HISTORY — PX: BOWEL RESECTION: SHX1257

## 2015-08-01 HISTORY — PX: FLEXIBLE SIGMOIDOSCOPY: SHX5431

## 2015-08-01 LAB — BASIC METABOLIC PANEL
ANION GAP: 5 (ref 5–15)
BUN: <5 mg/dL — ABNORMAL LOW (ref 6–20)
CALCIUM: 8.3 mg/dL — AB (ref 8.9–10.3)
CHLORIDE: 100 mmol/L — AB (ref 101–111)
CO2: 26 mmol/L (ref 22–32)
Creatinine, Ser: 0.51 mg/dL (ref 0.44–1.00)
GFR calc non Af Amer: >60 mL/min (ref >60)
Glucose, Bld: 102 mg/dL — ABNORMAL HIGH (ref 65–99)
POTASSIUM: 3.8 mmol/L (ref 3.5–5.1)
Sodium: 131 mmol/L — ABNORMAL LOW (ref 135–145)

## 2015-08-01 LAB — CBC
HCT: 32 % — ABNORMAL LOW (ref 36.0–46.0)
HEMOGLOBIN: 10.5 g/dL — AB (ref 12.0–15.0)
MCH: 29.2 pg (ref 26.0–34.0)
MCHC: 32.8 g/dL (ref 30.0–36.0)
MCV: 89.1 fL (ref 78.0–100.0)
Platelets: 439 10*3/uL — ABNORMAL HIGH (ref 150–400)
RBC: 3.59 MIL/uL — AB (ref 3.87–5.11)
RDW: 13.8 % (ref 11.5–15.5)
WBC: 7.2 10*3/uL (ref 4.0–10.5)

## 2015-08-01 LAB — TYPE AND SCREEN
ABO/RH(D): O POS
Antibody Screen: NEGATIVE

## 2015-08-01 SURGERY — EXCISION, SMALL INTESTINE
Anesthesia: General | Site: Rectum

## 2015-08-01 MED ORDER — HYDROMORPHONE HCL 1 MG/ML IJ SOLN
INTRAMUSCULAR | Status: AC
  Filled 2015-08-01: qty 1

## 2015-08-01 MED ORDER — DEXAMETHASONE SODIUM PHOSPHATE 10 MG/ML IJ SOLN
INTRAMUSCULAR | Status: DC | PRN
  Administered 2015-08-01: 8 mg via INTRAVENOUS

## 2015-08-01 MED ORDER — SUCCINYLCHOLINE CHLORIDE 20 MG/ML IJ SOLN
INTRAMUSCULAR | Status: DC | PRN
  Administered 2015-08-01: 60 mg via INTRAVENOUS

## 2015-08-01 MED ORDER — KETOROLAC TROMETHAMINE 30 MG/ML IJ SOLN
30.0000 mg | Freq: Four times a day (QID) | INTRAMUSCULAR | Status: DC
  Administered 2015-08-01 (×2): 30 mg via INTRAVENOUS
  Filled 2015-08-01 (×2): qty 1

## 2015-08-01 MED ORDER — METOCLOPRAMIDE HCL 5 MG/ML IJ SOLN
INTRAMUSCULAR | Status: AC
  Filled 2015-08-01: qty 2

## 2015-08-01 MED ORDER — HYDROMORPHONE HCL 1 MG/ML IJ SOLN
0.2500 mg | INTRAMUSCULAR | Status: DC | PRN
  Administered 2015-08-01 (×3): 0.5 mg via INTRAVENOUS

## 2015-08-01 MED ORDER — PHENYLEPHRINE 40 MCG/ML (10ML) SYRINGE FOR IV PUSH (FOR BLOOD PRESSURE SUPPORT)
PREFILLED_SYRINGE | INTRAVENOUS | Status: AC
  Filled 2015-08-01: qty 10

## 2015-08-01 MED ORDER — ROCURONIUM BROMIDE 100 MG/10ML IV SOLN
INTRAVENOUS | Status: DC | PRN
  Administered 2015-08-01: 25 mg via INTRAVENOUS
  Administered 2015-08-01: 10 mg via INTRAVENOUS
  Administered 2015-08-01: 5 mg via INTRAVENOUS
  Administered 2015-08-01 (×2): 10 mg via INTRAVENOUS

## 2015-08-01 MED ORDER — ALBUMIN HUMAN 5 % IV SOLN
INTRAVENOUS | Status: DC | PRN
  Administered 2015-08-01: 13:00:00 via INTRAVENOUS

## 2015-08-01 MED ORDER — CISATRACURIUM BESYLATE 20 MG/10ML IV SOLN
INTRAVENOUS | Status: AC
  Filled 2015-08-01: qty 10

## 2015-08-01 MED ORDER — PROPOFOL 10 MG/ML IV BOLUS
INTRAVENOUS | Status: DC | PRN
  Administered 2015-08-01: 80 mg via INTRAVENOUS

## 2015-08-01 MED ORDER — DEXAMETHASONE SODIUM PHOSPHATE 10 MG/ML IJ SOLN
INTRAMUSCULAR | Status: AC
  Filled 2015-08-01: qty 1

## 2015-08-01 MED ORDER — ONDANSETRON HCL 4 MG/2ML IJ SOLN
INTRAMUSCULAR | Status: AC
  Filled 2015-08-01: qty 2

## 2015-08-01 MED ORDER — BUPIVACAINE-EPINEPHRINE (PF) 0.25% -1:200000 IJ SOLN
INTRAMUSCULAR | Status: AC
Start: 2015-08-01 — End: 2015-08-01
  Filled 2015-08-01: qty 30

## 2015-08-01 MED ORDER — PHENYLEPHRINE HCL 10 MG/ML IJ SOLN
INTRAMUSCULAR | Status: DC | PRN
  Administered 2015-08-01 (×2): 80 ug via INTRAVENOUS
  Administered 2015-08-01: 40 ug via INTRAVENOUS
  Administered 2015-08-01: 80 ug via INTRAVENOUS

## 2015-08-01 MED ORDER — FENTANYL CITRATE (PF) 250 MCG/5ML IJ SOLN
INTRAMUSCULAR | Status: AC
  Filled 2015-08-01: qty 5

## 2015-08-01 MED ORDER — BUPIVACAINE-EPINEPHRINE (PF) 0.25% -1:200000 IJ SOLN
INTRAMUSCULAR | Status: DC | PRN
  Administered 2015-08-01: 7 mL via PERINEURAL

## 2015-08-01 MED ORDER — ONDANSETRON HCL 4 MG/2ML IJ SOLN
INTRAMUSCULAR | Status: DC | PRN
  Administered 2015-08-01: 4 mg via INTRAVENOUS

## 2015-08-01 MED ORDER — LACTATED RINGERS IV SOLN
INTRAVENOUS | Status: DC | PRN
  Administered 2015-08-01 (×3): via INTRAVENOUS

## 2015-08-01 MED ORDER — LACTATED RINGERS IV SOLN
INTRAVENOUS | Status: DC | PRN
  Administered 2015-08-01: 13:00:00 via INTRAVENOUS

## 2015-08-01 MED ORDER — SUGAMMADEX SODIUM 200 MG/2ML IV SOLN
INTRAVENOUS | Status: AC
  Filled 2015-08-01: qty 2

## 2015-08-01 MED ORDER — HYDROMORPHONE HCL 1 MG/ML IJ SOLN
INTRAMUSCULAR | Status: DC | PRN
  Administered 2015-08-01 (×2): .4 mg via INTRAVENOUS
  Administered 2015-08-01: .2 mg via INTRAVENOUS
  Administered 2015-08-01 (×2): .4 mg via INTRAVENOUS

## 2015-08-01 MED ORDER — EPHEDRINE SULFATE 50 MG/ML IJ SOLN
INTRAMUSCULAR | Status: AC
  Filled 2015-08-01: qty 1

## 2015-08-01 MED ORDER — FENTANYL CITRATE (PF) 100 MCG/2ML IJ SOLN
INTRAMUSCULAR | Status: DC | PRN
  Administered 2015-08-01: 50 ug via INTRAVENOUS
  Administered 2015-08-01: 100 ug via INTRAVENOUS
  Administered 2015-08-01 (×2): 50 ug via INTRAVENOUS

## 2015-08-01 MED ORDER — MIDAZOLAM HCL 5 MG/5ML IJ SOLN
INTRAMUSCULAR | Status: DC | PRN
  Administered 2015-08-01: 2 mg via INTRAVENOUS

## 2015-08-01 MED ORDER — PROMETHAZINE HCL 25 MG/ML IJ SOLN
INTRAMUSCULAR | Status: AC
  Filled 2015-08-01: qty 1

## 2015-08-01 MED ORDER — 0.9 % SODIUM CHLORIDE (POUR BTL) OPTIME
TOPICAL | Status: DC | PRN
  Administered 2015-08-01: 4000 mL

## 2015-08-01 MED ORDER — HYDROMORPHONE HCL 2 MG/ML IJ SOLN
INTRAMUSCULAR | Status: AC
  Filled 2015-08-01: qty 1

## 2015-08-01 MED ORDER — ALBUMIN HUMAN 5 % IV SOLN
INTRAVENOUS | Status: AC
  Filled 2015-08-01: qty 250

## 2015-08-01 MED ORDER — SUGAMMADEX SODIUM 200 MG/2ML IV SOLN
INTRAVENOUS | Status: DC | PRN
  Administered 2015-08-01: 125 mg via INTRAVENOUS

## 2015-08-01 MED ORDER — SODIUM CHLORIDE 0.9 % IJ SOLN
INTRAMUSCULAR | Status: AC
  Filled 2015-08-01: qty 20

## 2015-08-01 MED ORDER — LIDOCAINE HCL (CARDIAC) 20 MG/ML IV SOLN
INTRAVENOUS | Status: DC | PRN
  Administered 2015-08-01: 50 mg via INTRAVENOUS

## 2015-08-01 MED ORDER — MIDAZOLAM HCL 2 MG/2ML IJ SOLN
INTRAMUSCULAR | Status: AC
  Filled 2015-08-01: qty 2

## 2015-08-01 MED ORDER — PROMETHAZINE HCL 25 MG/ML IJ SOLN
6.2500 mg | INTRAMUSCULAR | Status: DC | PRN
  Administered 2015-08-01: 6.25 mg via INTRAVENOUS

## 2015-08-01 MED ORDER — HYDRALAZINE HCL 20 MG/ML IJ SOLN
INTRAMUSCULAR | Status: AC
  Filled 2015-08-01: qty 3

## 2015-08-01 MED ORDER — METOCLOPRAMIDE HCL 5 MG/ML IJ SOLN
INTRAMUSCULAR | Status: DC | PRN
  Administered 2015-08-01: 8 mg via INTRAVENOUS

## 2015-08-01 MED ORDER — EPHEDRINE SULFATE 50 MG/ML IJ SOLN
INTRAMUSCULAR | Status: DC | PRN
  Administered 2015-08-01: 5 mg via INTRAVENOUS

## 2015-08-01 SURGICAL SUPPLY — 63 items
APPLIER CLIP 5 13 M/L LIGAMAX5 (MISCELLANEOUS) ×3
APR CLP MED LRG 5 ANG JAW (MISCELLANEOUS) ×2
BLADE EXTENDED COATED 6.5IN (ELECTRODE) ×3 IMPLANT
BLADE HEX COATED 2.75 (ELECTRODE) ×6 IMPLANT
BRR ADH 6X5 SEPRAFILM 1 SHT (MISCELLANEOUS) ×2
CABLE HIGH FREQUENCY MONO STRZ (ELECTRODE) ×3 IMPLANT
CELLS DAT CNTRL 66122 CELL SVR (MISCELLANEOUS) ×2 IMPLANT
CLIP APPLIE 5 13 M/L LIGAMAX5 (MISCELLANEOUS) ×2 IMPLANT
COVER SURGICAL LIGHT HANDLE (MISCELLANEOUS) ×3 IMPLANT
DECANTER SPIKE VIAL GLASS SM (MISCELLANEOUS) ×3 IMPLANT
DRAIN CHANNEL 19F RND (DRAIN) ×1 IMPLANT
DRAPE LAPAROSCOPIC ABDOMINAL (DRAPES) ×3 IMPLANT
DRAPE UTILITY 15X26 (DRAPE) ×6 IMPLANT
DRSG OPSITE POSTOP 4X8 (GAUZE/BANDAGES/DRESSINGS) ×1 IMPLANT
ELECT REM PT RETURN 9FT ADLT (ELECTROSURGICAL) ×3
ELECTRODE REM PT RTRN 9FT ADLT (ELECTROSURGICAL) ×2 IMPLANT
EVACUATOR SILICONE 100CC (DRAIN) ×4 IMPLANT
GAUZE SPONGE 4X4 12PLY STRL (GAUZE/BANDAGES/DRESSINGS) ×3 IMPLANT
GLOVE BIO SURGEON STRL SZ 6.5 (GLOVE) ×9 IMPLANT
GLOVE BIOGEL PI IND STRL 7.0 (GLOVE) ×4 IMPLANT
GLOVE BIOGEL PI INDICATOR 7.0 (GLOVE) ×2
GOWN L4 XXLG W/PAP TWL (GOWN DISPOSABLE) ×6 IMPLANT
LEGGING LITHOTOMY PAIR STRL (DRAPES) ×3 IMPLANT
LIGASURE IMPACT 36 18CM CVD LR (INSTRUMENTS) IMPLANT
LIQUID BAND (GAUZE/BANDAGES/DRESSINGS) ×1 IMPLANT
NS IRRIG 1000ML POUR BTL (IV SOLUTION) ×3 IMPLANT
PACK COLON (CUSTOM PROCEDURE TRAY) ×3 IMPLANT
RELOAD PROXIMATE 75MM BLUE (ENDOMECHANICALS) ×6 IMPLANT
RELOAD STAPLE 75 3.8 BLU REG (ENDOMECHANICALS) IMPLANT
RETRACTOR WND ALEXIS 18 MED (MISCELLANEOUS) IMPLANT
RTRCTR WOUND ALEXIS 18CM MED (MISCELLANEOUS) ×3
SCISSORS LAP 5X35 DISP (ENDOMECHANICALS) ×3 IMPLANT
SEPRAFILM MEMBRANE 5X6 (MISCELLANEOUS) ×1 IMPLANT
SET IRRIG TUBING LAPAROSCOPIC (IRRIGATION / IRRIGATOR) ×3 IMPLANT
SLEEVE XCEL OPT CAN 5 100 (ENDOMECHANICALS) ×7 IMPLANT
SOLUTION ANTI FOG 6CC (MISCELLANEOUS) ×3 IMPLANT
SPONGE DRAIN TRACH 4X4 STRL 2S (GAUZE/BANDAGES/DRESSINGS) ×1 IMPLANT
STAPLER GUN LINEAR PROX 60 (STAPLE) ×1 IMPLANT
STAPLER PROXIMATE 75MM BLUE (STAPLE) ×3 IMPLANT
STAPLER VISISTAT 35W (STAPLE) ×3 IMPLANT
SUT ETHILON 2 0 PS N (SUTURE) ×3 IMPLANT
SUT NOVA NAB DX-16 0-1 5-0 T12 (SUTURE) IMPLANT
SUT PDS AB 1 CTX 36 (SUTURE) ×6 IMPLANT
SUT PDS AB 1 TP1 96 (SUTURE) IMPLANT
SUT PROLENE 2 0 KS (SUTURE) IMPLANT
SUT SILK 2 0 (SUTURE) ×6
SUT SILK 2 0 SH CR/8 (SUTURE) ×8 IMPLANT
SUT SILK 2-0 18XBRD TIE 12 (SUTURE) ×2 IMPLANT
SUT SILK 3 0 (SUTURE) ×3
SUT SILK 3 0 SH CR/8 (SUTURE) ×4 IMPLANT
SUT SILK 3-0 18XBRD TIE 12 (SUTURE) ×2 IMPLANT
SUT VIC AB 2-0 SH 18 (SUTURE) IMPLANT
SUT VIC AB 4-0 PS2 18 (SUTURE) ×1 IMPLANT
SUT VICRYL 2 0 18  UND BR (SUTURE)
SUT VICRYL 2 0 18 UND BR (SUTURE) IMPLANT
SYS LAPSCP GELPORT 120MM (MISCELLANEOUS)
SYSTEM LAPSCP GELPORT 120MM (MISCELLANEOUS) IMPLANT
TAPE CLOTH SURG 4X10 WHT LF (GAUZE/BANDAGES/DRESSINGS) ×1 IMPLANT
TRAY FOLEY W/METER SILVER 14FR (SET/KITS/TRAYS/PACK) ×3 IMPLANT
TRAY FOLEY W/METER SILVER 16FR (SET/KITS/TRAYS/PACK) ×3 IMPLANT
TROCAR BLADELESS OPT 5 100 (ENDOMECHANICALS) ×3 IMPLANT
TROCAR XCEL BLUNT TIP 100MML (ENDOMECHANICALS) ×3 IMPLANT
TUBING FILTER THERMOFLATOR (ELECTROSURGICAL) ×3 IMPLANT

## 2015-08-01 NOTE — Transfer of Care (Signed)
Immediate Anesthesia Transfer of Care Note  Patient: Angela Schmidt  Procedure(s) Performed: Procedure(s): SMALL BOWEL RESECTION, takedown of colorectal anastomosis with colostomy (N/A) FLEXIBLE SIGMOIDOSCOPY with biopsies (N/A) LAPAROSCOPY  (N/A)  Patient Location: PACU  Anesthesia Type:General  Level of Consciousness: awake  Airway & Oxygen Therapy: Patient Spontanous Breathing and Patient connected to face mask oxygen  Post-op Assessment: Report given to RN and Post -op Vital signs reviewed and stable  Post vital signs: Reviewed and stable  Last Vitals:  Filed Vitals:   08/01/15 1152 08/01/15 1153  BP:  111/69  Pulse:  83  Temp:  36.8 C  Resp: 12 12    Complications: No apparent anesthesia complications

## 2015-08-01 NOTE — Anesthesia Preprocedure Evaluation (Addendum)
Anesthesia Evaluation  Patient identified by MRN, date of birth, ID band Patient awake    Reviewed: Allergy & Precautions, H&P , NPO status , Patient's Chart, lab work & pertinent test results  Airway Mallampati: II  TM Distance: >3 FB Neck ROM: Full    Dental  (+) Dental Advisory Given, Edentulous Upper, Edentulous Lower   Pulmonary Current Smoker,    Pulmonary exam normal        Cardiovascular negative cardio ROS Normal cardiovascular exam     Neuro/Psych negative neurological ROS  negative psych ROS   GI/Hepatic Neg liver ROS, Colon CA   Endo/Other  negative endocrine ROS  Renal/GU negative Renal ROS  negative genitourinary   Musculoskeletal  (+) Arthritis ,   Abdominal   Peds  Hematology negative hematology ROS (+)   Anesthesia Other Findings   Reproductive/Obstetrics negative OB ROS                          Anesthesia Physical Anesthesia Plan  ASA: III  Anesthesia Plan: General   Post-op Pain Management:    Induction: Intravenous  Airway Management Planned: Oral ETT  Additional Equipment:   Intra-op Plan:   Post-operative Plan: Extubation in OR  Informed Consent: I have reviewed the patients History and Physical, chart, labs and discussed the procedure including the risks, benefits and alternatives for the proposed anesthesia with the patient or authorized representative who has indicated his/her understanding and acceptance.   Dental advisory given  Plan Discussed with: CRNA  Anesthesia Plan Comments:         Anesthesia Quick Evaluation                                  Anesthesia Evaluation  Patient identified by MRN, date of birth, ID band Patient awake    Reviewed: Allergy & Precautions, H&P , NPO status , Patient's Chart, lab work & pertinent test results  History of Anesthesia Complications Negative for: history of anesthetic  complications  Airway Mallampati: II TM Distance: >3 FB Neck ROM: Full    Dental  (+) Edentulous Upper, Edentulous Lower   Pulmonary Current Smoker,  Chest CT: moderate to severe emphysema breath sounds clear to auscultation  Pulmonary exam normal       Cardiovascular Exercise Tolerance: Good negative cardio ROS  Rhythm:Regular Rate:Normal  ECG 12-25-12: negative precordial t waves. Probably normal;   Neuro/Psych negative neurological ROS  negative psych ROS   GI/Hepatic negative GI ROS, Neg liver ROS,   Endo/Other  negative endocrine ROS  Renal/GU negative Renal ROS     Musculoskeletal negative musculoskeletal ROS (+)   Abdominal   Peds  Hematology negative hematology ROS (+)   Anesthesia Other Findings   Reproductive/Obstetrics negative OB ROS                          Anesthesia Physical Anesthesia Plan  ASA: II  Anesthesia Plan: General   Post-op Pain Management:    Induction: Intravenous  Airway Management Planned: Oral ETT  Additional Equipment:   Intra-op Plan:   Post-operative Plan: Extubation in OR  Informed Consent: I have reviewed the patients History and Physical, chart, labs and discussed the procedure including the risks, benefits and alternatives for the proposed anesthesia with the patient or authorized representative who has indicated his/her understanding and acceptance.   Dental advisory given  Plan Discussed with: CRNA  Anesthesia Plan Comments:        Anesthesia Quick Evaluation                                  Anesthesia Evaluation  Patient identified by MRN, date of birth, ID band Patient awake    Reviewed: Allergy & Precautions, H&P , NPO status , Patient's Chart, lab work & pertinent test results  History of Anesthesia Complications Negative for: history of anesthetic complications  Airway Mallampati: II TM Distance: >3 FB Neck ROM: Full    Dental  (+) Edentulous Upper  and Edentulous Lower   Pulmonary Current Smoker,  Chest CT: moderate to severe emphysema breath sounds clear to auscultation  Pulmonary exam normal       Cardiovascular Exercise Tolerance: Good negative cardio ROS  Rhythm:Regular Rate:Normal  ECG 12-25-12: negative precordial t waves. Probably normal;   Neuro/Psych negative neurological ROS  negative psych ROS   GI/Hepatic negative GI ROS, Neg liver ROS,   Endo/Other  negative endocrine ROS  Renal/GU negative Renal ROS     Musculoskeletal negative musculoskeletal ROS (+)   Abdominal   Peds  Hematology negative hematology ROS (+)   Anesthesia Other Findings   Reproductive/Obstetrics negative OB ROS                           Anesthesia Physical  Anesthesia Plan  ASA: III  Anesthesia Plan: General   Post-op Pain Management:    Induction: Intravenous  Airway Management Planned: Oral ETT  Additional Equipment:   Intra-op Plan:   Post-operative Plan: Extubation in OR  Informed Consent: I have reviewed the patients History and Physical, chart, labs and discussed the procedure including the risks, benefits and alternatives for the proposed anesthesia with the patient or authorized representative who has indicated his/her understanding and acceptance.   Dental advisory given  Plan Discussed with: CRNA  Anesthesia Plan Comments:         Anesthesia Quick Evaluation

## 2015-08-01 NOTE — Anesthesia Procedure Notes (Signed)
Procedure Name: Intubation Date/Time: 08/01/2015 1:00 PM Performed by: Ajna Moors, Virgel Gess Pre-anesthesia Checklist: Patient identified, Emergency Drugs available, Suction available and Patient being monitored Patient Re-evaluated:Patient Re-evaluated prior to inductionOxygen Delivery Method: Circle system utilized Preoxygenation: Pre-oxygenation with 100% oxygen Intubation Type: IV induction Ventilation: Mask ventilation without difficulty Laryngoscope Size: Miller and 2 Grade View: Grade I Tube type: Oral Tube size: 7.0 mm Number of attempts: 1 Airway Equipment and Method: Stylet Placement Confirmation: ETT inserted through vocal cords under direct vision,  positive ETCO2 and breath sounds checked- equal and bilateral Secured at: 21 cm Tube secured with: Tape Dental Injury: Teeth and Oropharynx as per pre-operative assessment

## 2015-08-01 NOTE — Progress Notes (Signed)
Pelvic abscess in female  Subjective: Pt getting good pain relief with PCA and heating pad, no nausea, had a BM this am  Objective: Vital signs in last 24 hours: Temp:  [97.8 F (36.6 C)-99.1 F (37.3 C)] 98.2 F (36.8 C) (12/13 1153) Pulse Rate:  [71-90] 83 (12/13 1153) Resp:  [12-39] 12 (12/13 1153) BP: (103-117)/(67-74) 111/69 mmHg (12/13 1153) SpO2:  [95 %-99 %] 99 % (12/13 1153) Last BM Date: 07/31/15  Intake/Output from previous day:   Intake/Output this shift:    General appearance: alert and cooperative Cardio: regular rate and rhythm GI: normal findings: soft, non-tender, less distended Lab Results:  Results for orders placed or performed during the hospital encounter of 07/29/15 (from the past 24 hour(s))  Surgical pcr screen     Status: Abnormal   Collection Time: 07/31/15 12:12 PM  Result Value Ref Range   MRSA, PCR NEGATIVE NEGATIVE   Staphylococcus aureus POSITIVE (A) NEGATIVE  LAB REPORT - SCANNED     Status: None   Collection Time: 07/31/15  1:13 PM   Narrative   Ordered by an unspecified provider.  Basic metabolic panel     Status: Abnormal   Collection Time: 08/01/15  4:34 AM  Result Value Ref Range   Sodium 131 (L) 135 - 145 mmol/L   Potassium 3.8 3.5 - 5.1 mmol/L   Chloride 100 (L) 101 - 111 mmol/L   CO2 26 22 - 32 mmol/L   Glucose, Bld 102 (H) 65 - 99 mg/dL   BUN <5 (L) 6 - 20 mg/dL   Creatinine, Ser 0.51 0.44 - 1.00 mg/dL   Calcium 8.3 (L) 8.9 - 10.3 mg/dL   GFR calc non Af Amer >60 >60 mL/min   GFR calc Af Amer >60 >60 mL/min   Anion gap 5 5 - 15  CBC     Status: Abnormal   Collection Time: 08/01/15  4:34 AM  Result Value Ref Range   WBC 7.2 4.0 - 10.5 K/uL   RBC 3.59 (L) 3.87 - 5.11 MIL/uL   Hemoglobin 10.5 (L) 12.0 - 15.0 g/dL   HCT 32.0 (L) 36.0 - 46.0 %   MCV 89.1 78.0 - 100.0 fL   MCH 29.2 26.0 - 34.0 pg   MCHC 32.8 30.0 - 36.0 g/dL   RDW 13.8 11.5 - 15.5 %   Platelets 439 (H) 150 - 400 K/uL      Studies/Results Radiology     MEDS, Scheduled . [MAR Hold] Chlorhexidine Gluconate Cloth  6 each Topical Daily  . [MAR Hold] enoxaparin (LOVENOX) injection  30 mg Subcutaneous Q24H  . [MAR Hold] ertapenem  1 g Intravenous Q24H  . [MAR Hold] HYDROmorphone   Intravenous 6 times per day  . [MAR Hold] mupirocin ointment  1 application Nasal BID  . [MAR Hold] nicotine  21 mg Transdermal Q24H     Assessment: Pelvic abscess in female H/o rectal cancer  Plan: Cont Invanz for pelvic abscess Cont PCA for pain control Ostomy site marked Hypokalemia: better this am Rectal cancer: Will get CT chest tom to eval for metastatic disease, CEA in office was 1.8 We will plan on performing a pelvic washout with biopsy and diverting ostomy.  She understands that this ostomy will most likely be permanent.  Risks mainly include damage to adjacent structures due to pelvic scarring as well as bleeding and continued infection.    The surgery and anatomy were described to the patient as well as the risks of surgery and  the possible complications.  These include: Bleeding, deep abdominal infections and possible wound complications such as hernia and infection, damage to adjacent structures, possible need for other procedures, such as abscess drains in radiology, possible prolonged hospital stay, possible diarrhea from removal of part of the colon, possible constipation from narcotics, prolonged fatigue/weakness or appetite loss, possible early recurrence of of disease, possible complications of their medical problems such as heart disease or arrhythmias or lung problems, death (less than 1%). I believe the patient understands and wishes to proceed with the surgery.    Potentially plan for OR Tues for pelvic washout and diverting stoma    LOS: 3 days    Rosario Adie, MD [facility], [place]   08/01/2015 12:06 PM

## 2015-08-01 NOTE — Op Note (Signed)
07/29/2015 - 08/01/2015  4:05 PM  PATIENT:  Angela Schmidt  54 y.o. female  Patient Care Team: Midge Minium, MD as PCP - General (Family Medicine) Ladell Pier, MD as Consulting Physician (Oncology) Teena Irani, MD as Consulting Physician (Gastroenterology)  PRE-OPERATIVE DIAGNOSIS:  pelvic abscess and fistula  POST-OPERATIVE DIAGNOSIS:  pelvic abscess and fistula  PROCEDURE:   SMALL BOWEL RESECTION, takedown of colorectal anastomosis with colostomy FLEXIBLE SIGMOIDOSCOPY WITH BIOPSY DIAGNOSTIC LAPAROSCOPY    Surgeon(s): Leighton Ruff, MD  ASSISTANT: none   ANESTHESIA:   general  EBL:  Total I/O In: 3200 [I.V.:2950; IV Piggyback:250] Out: 975 [Urine:625; Blood:350]  DRAINS: (19F) Jackson-Pratt drain(s) with closed bulb suction in the pelvis   SPECIMEN:  Source of Specimen:  small bowel and large bowel involved in abscess cavity and fistula  DISPOSITION OF SPECIMEN:  PATHOLOGY  COUNTS:  YES  PLAN OF CARE: patient admitted  PATIENT DISPOSITION:  PACU - hemodynamically stable.  INDICATION: 54 year old female who presents to the office with an anal rectal stricture and worsening pain. She then developed an acute change in her pain and presents to the emergency department. On CT scan she appeared to have a posterior pelvic abscess with fistulization to the left and right hip. She has a history of a coloanal anastomosis due to a low rectal cancer. It was felt that this was due to anastomotic disruption and she would need a permanent colostomy.   OR FINDINGS: Posterior anastomotic disruption with abscess cavity. Small bowel walling off abscess cavity anteriorly  DESCRIPTION: the patient was identified in the preoperative holding area and taken to the OR where they were laid supine on the operating room table.  General anesthesia was induced without difficulty. SCDs were also noted to be in place prior to the initiation of anesthesia.  The patient was placed in  lithotomy position and appropriately padded. A Foley catheter was inserted under sterile conditions. I began by performing a Betadine enema to wash out her pelvic abscess. After this we irrigated with normal saline. I then inserted the flexible sigmoidoscope. The distal end of the colon was anterior and the posterior portion of this was disrupted. There was a large posterior abscess cavity tracking towards the left and the right hips. Biopsies of the abscess cavity and distal colon were taken using forceps. This was sent to pathology for evaluation for malignancy.  The patient was then prepped and draped in the usual sterile fashion.  A second surgical timeout was performed indicating the correct patient, procedure, positioning and need for preoperative antibioticsI then made a infraumbilical incision using a 15 blade scalpel. Dissection was carried down to the fascia. The fascia was elevated with Kocher clamps the fascia was incised at midline with a 15 blade scalpel. The peritoneum was entered bluntly. A stay suture was placed an Hassan port was placed without difficulty. A 5 mm laparoscope was placed into the abdomen after insufflation.  The patient was placed in Trendelenburg and we evaluated the pelvis. The colon was identified on the left sidewall and appeared mildly adherent to the pelvis. The left ureter could be visualized and appeared to be free from adhesions. There were several loops of small bowel that were adherent to the pelvis. I tried to laparoscopically mobilize this but they were down to deep in the pelvis for me to visualize safely. I evaluated the rest of the abdomen. There was no other signs of metastatic disease. I decided that the colon just could be mobilized out  of the pelvis with correct dissection and therefore I converted to an open procedure. My incision was extended down towards the pubis at midline. I placed an IT sales professional. I then began to dissect out the small bowel from  the pelvis using Metzenbaum scissors. There was a loop of small bowel that was adherent to the abscess cavity and was walling off this from the rest of the pelvis. This was bluntly mobilized out of the pelvis. This portion of small bowel was resected using 2 GIA blue load staplers of 75 mm in length. An anastomosis was then created using a 75 mm blue load stapler. The common enterotomy channel was closed using a TA 60 mm stapler blue load. 3-0 silk sutures were used to obtain hemostasis at the staple line. An anti-tension suture was also placed. This was then placed back into the abdomen and the small bowel was packed out of the pelvis. I then mobilized the colon from the left lateral sidewall using blunt dissection. I was able to bluntly free the remaining portion of the anterior coloanal anastomosis and bring this out of the pelvis. The distal portion of the colon was stapled using a blue load 75 mm GIA stapler. This was then packed into the upper abdomen as well. The pelvis was then packed with sponges for hemostasis. We evaluated the rest of the abdomen. A few small bleeding vessels were tied using 3-0 silk suture.  I identified the left ureter once again and confirmed that it was intact and not disrupted. I mobilized the remaining portion of the colon off of the left lateral sidewall using blunt dissection and Bovie electric cautery. I mobilized the mesentery off of midline using blunt dissection and electrocautery as well. Once the colon was mobilized appropriately, I made a incision in the skin in the previously marked ostomy site in the left lower quadrant. Dissection was carried down through the subcutaneous tissue using electrocautery. The fascia was incised in a cruciate manner. The rectus muscle fibers were split and the peritoneum was entered using electrocautery as well. The colostomy site was dilated to 1-1/2 fingerbreadths. The colon was then brought out through this colostomy site and secured. We  then irrigated the abdomen with 2 L of warm normal saline. I then ran the small bowel from the terminal ileum to the ligament of Treitz. There were no other areas of injury to the bowel that were noted. The bowel was then placed back into the abdomen and the omentum was brought over this. We then evaluated the pelvis once again. A few small bleeding areas were controlled with electrocautery. A 19 [place] Blake drain was then placed into the pelvis and brought out through the right lower quadrant port site. This was secured into place with a 2-0 nylon suture.  We then switched to clean gowns, gloves, drapes and instruments.  A piece of Seprafilm was then placed over the omentum that was lying over the small bowel at midline. The fascia was then closed using #1 PDS suture 2. This was done in a running fashion. The subcuticular tissue was reapproximated using 2-0 Vicryl sutures. The skin was closed with a 4-0 Vicryl suture. The port site was also closed with a 4-0 Vicryl suture. Dermabond was applied to the port site. A dressing was applied to midline. I then matured the colostomy in standard [place] fashion. This was done using 2-0 Vicryl sutures. A colostomy appliance was placed over this. The patient was then awakened from anesthesia and  sent to the post anesthesia care unit in stable condition. All counts were correct per operating room staff.

## 2015-08-02 ENCOUNTER — Encounter (HOSPITAL_COMMUNITY): Payer: Self-pay | Admitting: General Surgery

## 2015-08-02 LAB — BASIC METABOLIC PANEL
Anion gap: 7 (ref 5–15)
BUN: <5 mg/dL — ABNORMAL LOW (ref 6–20)
CALCIUM: 7.5 mg/dL — AB (ref 8.9–10.3)
CO2: 25 mmol/L (ref 22–32)
CREATININE: 0.53 mg/dL (ref 0.44–1.00)
Chloride: 99 mmol/L — ABNORMAL LOW (ref 101–111)
GFR calc non Af Amer: >60 mL/min (ref >60)
Glucose, Bld: 129 mg/dL — ABNORMAL HIGH (ref 65–99)
Potassium: 4.1 mmol/L (ref 3.5–5.1)
SODIUM: 131 mmol/L — AB (ref 135–145)

## 2015-08-02 LAB — CBC
HCT: 28.5 % — ABNORMAL LOW (ref 36.0–46.0)
Hemoglobin: 9.6 g/dL — ABNORMAL LOW (ref 12.0–15.0)
MCH: 29.9 pg (ref 26.0–34.0)
MCHC: 33.7 g/dL (ref 30.0–36.0)
MCV: 88.8 fL (ref 78.0–100.0)
PLATELETS: 394 10*3/uL (ref 150–400)
RBC: 3.21 MIL/uL — ABNORMAL LOW (ref 3.87–5.11)
RDW: 13.9 % (ref 11.5–15.5)
WBC: 12.7 10*3/uL — ABNORMAL HIGH (ref 4.0–10.5)

## 2015-08-02 MED ORDER — KETOROLAC TROMETHAMINE 30 MG/ML IJ SOLN
15.0000 mg | Freq: Four times a day (QID) | INTRAMUSCULAR | Status: AC | PRN
  Administered 2015-08-02 – 2015-08-06 (×13): 15 mg via INTRAVENOUS
  Filled 2015-08-02 (×13): qty 1

## 2015-08-02 MED ORDER — KETOROLAC TROMETHAMINE 30 MG/ML IJ SOLN: 15.0000 mg | Freq: Four times a day (QID) | INTRAMUSCULAR | Status: DC

## 2015-08-02 NOTE — Progress Notes (Signed)
1 Day Post-Op Takedown of coloanal anastomosis and end colostomy creation Subjective: Having more pain, toradol and PCA combination worked well last night  Objective: Vital signs in last 24 hours: Temp:  [97.4 F (36.3 C)-98.2 F (36.8 C)] 97.7 F (36.5 C) (12/14 0530) Pulse Rate:  [68-92] 83 (12/14 0530) Resp:  [10-17] 14 (12/14 0811) BP: (95-141)/(53-90) 95/53 mmHg (12/14 0530) SpO2:  [92 %-100 %] 94 % (12/14 0811) FiO2 (%):  [2 %] 2 % (12/13 2301)   Intake/Output from previous day: 12/13 0701 - 12/14 0700 In: 3200 [I.V.:2950; IV Piggyback:250] Out: 2185 [Urine:1375; Drains:460; Blood:350] Intake/Output this shift:     General appearance: alert and cooperative GI: normal findings: soft, appropriately tender Ostomy: beefy red NG: bilious output Incision: no significant drainage  Lab Results:   Recent Labs  08/01/15 0434 08/02/15 0351  WBC 7.2 12.7*  HGB 10.5* 9.6*  HCT 32.0* 28.5*  PLT 439* 394   BMET  Recent Labs  08/01/15 0434 08/02/15 0351  NA 131* 131*  K 3.8 4.1  CL 100* 99*  CO2 26 25  GLUCOSE 102* 129*  BUN <5* <5*  CREATININE 0.51 0.53  CALCIUM 8.3* 7.5*   PT/INR No results for input(s): LABPROT, INR in the last 72 hours. ABG No results for input(s): PHART, HCO3 in the last 72 hours.  Invalid input(s): PCO2, PO2  MEDS, Scheduled . enoxaparin (LOVENOX) injection  30 mg Subcutaneous Q24H  . ertapenem  1 g Intravenous Q24H  . HYDROmorphone   Intravenous 6 times per day  . mupirocin ointment  1 application Nasal BID  . nicotine  21 mg Transdermal Q24H    Studies/Results: Ct Chest W Contrast  07/31/2015  CLINICAL DATA:  Rectal cancer initially diagnosed in July 2014. Followup recent CT abdomen/ pelvis. EXAM: CT CHEST WITH CONTRAST TECHNIQUE: Multidetector CT imaging of the chest was performed during intravenous contrast administration. CONTRAST:  101mL OMNIPAQUE IOHEXOL 300 MG/ML  SOLN COMPARISON:  CT abdomen 07/29/2015 and prior chest CT  02/23/2014 FINDINGS: Mediastinum/Nodes: No axillary or supraclavicular adenopathy. The thyroid gland appears normal. The heart is normal in size. No pericardial effusion. The aorta is normal in caliber. No dissection. The branch vessels are patent. No mediastinal or hilar mass or adenopathy. The esophagus is grossly normal. Lungs/Pleura: Stable advanced emphysematous changes and pulmonary scarring. There are bilateral pleural effusions and overlying atelectasis. Subpleural atelectasis in the lingula also. No worrisome pulmonary nodules. Upper abdomen: Mesenteric edema aunt hand retroperitoneal fluid of uncertain significance. Small left renal calculus is noted. Musculoskeletal: No significant bony findings. IMPRESSION: 1. Stable emphysematous changes and pulmonary scarring. 2. Small bilateral pleural effusions with overlying atelectasis. 3. No worrisome pulmonary lesions. No mediastinal or hilar lymphadenopathy. 4. Upper abdominal fluid and edema. Electronically Signed   By: Marijo Sanes M.D.   On: 07/31/2015 11:28    Assessment: s/p Procedure(s): SMALL BOWEL RESECTION, takedown of colorectal anastomosis with colostomy FLEXIBLE SIGMOIDOSCOPY with biopsies LAPAROSCOPY  Patient Active Problem List   Diagnosis Date Noted  . Pelvic abscess in female 07/29/2015  . Rectal stenosis 04/04/2015  . Rash and nonspecific skin eruption 04/04/2015  . Rectal pain 01/19/2015  . Physical exam 06/09/2014  . Right thyroid nodule 03/07/2014  . Slow transit constipation 03/07/2014  . Personal history of malignant neoplasm of rectum 2014 02/21/2014  . Rectal cancer ([place]) 02/25/2013  . Screening for malignant neoplasm of the cervix 12/25/2012  . Diarrhea 12/25/2012    Expected post op course  Plan: PAS  Ambulate  Cont NG Will d/c foley in AM Will switch Toradol to prn to help with her pain control Cont IVF's   LOS: 4 days     .Rosario Adie, [facility],  [place]   08/02/2015 10:14 AM

## 2015-08-02 NOTE — Consult Note (Signed)
WOC ostomy consult note Stoma type/location: LLQ colostomy Stomal assessment/size: approximately 2 and 1/4 inches, red, moist, bedded, edematous Peristomal assessment: not seen today Treatment options for stomal/peristomal skin: Not seen today Output scant serosanguinous in pouch Ostomy pouching: 1pc.[place] Karaya pouch. Will plan to change to 2-piece pouching system tomorrow as patient is familiar with that and it gives her many more lifestyle options. Education provided: Patient is provided with an educational booklet and we continue our discussion about how a colostomy differs from the ileostomy she had previously.  She is still using the PCA medication and a heating pad for abdominal discomfort. Karaya pouch is overlapping the honeycomb surgical dressing which we would love to leave intact for an additional day.  With no urgency to the stoma assessment or the pouch change, I will plan for pouch change in am . Enrolled patient in [facility] program: Yes, previously.  Not for this new stoma [place] nursing team will and remain available to this patient, the nursing and medical teams.  Thanks, Maudie Flakes, MSN, RN, [place], Arther Abbott  Pager# [phone]

## 2015-08-02 NOTE — Anesthesia Postprocedure Evaluation (Signed)
Anesthesia Post Note  Patient: Angela Schmidt  Procedure(s) Performed: Procedure(s) (LRB): SMALL BOWEL RESECTION, takedown of colorectal anastomosis with colostomy (N/A) FLEXIBLE SIGMOIDOSCOPY with biopsies (N/A) LAPAROSCOPY  (N/A)  Patient location during evaluation: PACU Anesthesia Type: General Level of consciousness: awake and alert Pain management: pain level controlled Vital Signs Assessment: post-procedure vital signs reviewed and stable Respiratory status: spontaneous breathing, nonlabored ventilation, respiratory function stable and patient connected to nasal cannula oxygen Cardiovascular status: blood pressure returned to baseline and stable Postop Assessment: no signs of nausea or vomiting Anesthetic complications: no    Last Vitals:  Filed Vitals:   08/02/15 0811 08/02/15 1122  BP:    Pulse:    Temp:    Resp: 14 19    Last Pain:  Filed Vitals:   08/02/15 1123  PainSc: 5                  Ned Kakar L

## 2015-08-03 ENCOUNTER — Other Ambulatory Visit: Payer: BLUE CROSS/BLUE SHIELD

## 2015-08-03 LAB — BASIC METABOLIC PANEL WITH GFR
Anion gap: 6 (ref 5–15)
BUN: 13 mg/dL (ref 6–20)
CO2: 33 mmol/L — ABNORMAL HIGH (ref 22–32)
Calcium: 7.6 mg/dL — ABNORMAL LOW (ref 8.9–10.3)
Chloride: 100 mmol/L — ABNORMAL LOW (ref 101–111)
Creatinine, Ser: 0.79 mg/dL (ref 0.44–1.00)
GFR calc Af Amer: >60 mL/min (ref >60)
GFR calc non Af Amer: >60 mL/min (ref >60)
Glucose, Bld: 99 mg/dL (ref 65–99)
Potassium: 4.1 mmol/L (ref 3.5–5.1)
Sodium: 139 mmol/L (ref 135–145)

## 2015-08-03 LAB — CBC
HEMATOCRIT: 26.4 % — AB (ref 36.0–46.0)
Hemoglobin: 8.6 g/dL — ABNORMAL LOW (ref 12.0–15.0)
MCH: 29.6 pg (ref 26.0–34.0)
MCHC: 32.6 g/dL (ref 30.0–36.0)
MCV: 90.7 fL (ref 78.0–100.0)
PLATELETS: 408 10*3/uL — AB (ref 150–400)
RBC: 2.91 MIL/uL — AB (ref 3.87–5.11)
RDW: 14.4 % (ref 11.5–15.5)
WBC: 7.8 10*3/uL (ref 4.0–10.5)

## 2015-08-03 MED ORDER — LACTATED RINGERS IV BOLUS (SEPSIS)
1000.0000 mL | Freq: Once | INTRAVENOUS | Status: AC
  Administered 2015-08-03: 1000 mL via INTRAVENOUS

## 2015-08-03 MED ORDER — LIDOCAINE HCL 1 % IJ SOLN
INTRAMUSCULAR | Status: AC
  Filled 2015-08-03: qty 80

## 2015-08-03 NOTE — Progress Notes (Signed)
PT Cancellation Note  Patient Details Name: Angela Schmidt MRN: [id number] DOB: 1961-04-01   Cancelled Treatment:    Reason Eval/Treat Not Completed: Fatigue/lethargy limiting ability to participate (requesting to rest at this time, states she did not sleep much last night)   [name],KATHrine E 08/03/2015, 9:30 AM Carmelia Bake, PT, DPT 08/03/2015 Pager: [phone]

## 2015-08-03 NOTE — Evaluation (Signed)
Physical Therapy Evaluation Patient Details Name: Angela Schmidt MRN: [id number] DOB: Dec 11, 1960 Today's Date: Schmidt   History of Present Illness  Pt is a 54 year old female s/p small bowel resection, takedown of colorectal anastomosis with colostomy 08/01/15  Clinical Impression  Patient evaluated by Physical Therapy with no further acute PT needs identified. All education has been completed and the patient has no further questions.  Pt mobilizing well, only supervision level due to multiples lines/drains.  Pt feels able to perform mobility with nursing staff and agreeable to d/c from acute PT. See below for any follow-up Physical Therapy or equipment needs. PT is signing off. Thank you for this referral.     Follow Up Recommendations No PT follow up    Equipment Recommendations  None recommended by PT    Recommendations for Other Services       Precautions / Restrictions Precautions Precautions: Fall Precaution Comments: Colostomy, R JP drain, NG tube      Mobility  Bed Mobility Overal bed mobility: Needs Assistance Bed Mobility: Supine to Sit     Supine to sit: Supervision     General bed mobility comments: supervision for multiples lines/drains  Transfers Overall transfer level: Needs assistance Equipment used: None Transfers: Sit to/from Stand Sit to Stand: Supervision         General transfer comment: supervision for multiples lines/drains  Ambulation/Gait Ambulation/Gait assistance: Min guard;Supervision Ambulation Distance (Feet): 300 Feet Assistive device: None Gait Pattern/deviations: Step-through pattern;Decreased stride length     General Gait Details: slow but steady pace, pt pushed IV pole, supervision for multiples lines/drains  Stairs            Wheelchair Mobility    Modified Rankin (Stroke Patients Only)       Balance                                             Pertinent Vitals/Pain Pain Assessment:  0-10 Pain Score: 4  Pain Location: abdomen Pain Descriptors / Indicators: Sore Pain Intervention(s): Heat applied;PCA encouraged;Monitored during session;Limited activity within patient's tolerance;Repositioned (has [place])    Home Living Family/patient expects to be discharged to:: Private residence Living Arrangements: Children Available Help at Discharge: Family         Home Layout: Two level ("split level") Home Equipment: None      Prior Function Level of Independence: Independent               Hand Dominance        Extremity/Trunk Assessment   Upper Extremity Assessment: Overall WFL for tasks assessed           Lower Extremity Assessment: Overall WFL for tasks assessed      Cervical / Trunk Assessment: Normal  Communication   Communication: No difficulties  Cognition Arousal/Alertness: Awake/alert Behavior During Therapy: WFL for tasks assessed/performed Overall Cognitive Status: Within Functional Limits for tasks assessed                      General Comments      Exercises        Assessment/Plan    PT Assessment Patent does not need any further PT services  PT Diagnosis Difficulty walking   PT Problem List    PT Treatment Interventions     PT Goals (Current goals can be found in the Care Plan section)  Acute Rehab PT Goals PT Goal Formulation: All assessment and education complete, DC therapy    Frequency     Barriers to discharge        Co-evaluation               End of Session   Activity Tolerance: Patient tolerated treatment well Patient left: in chair;with call bell/phone within reach;with chair alarm set Nurse Communication: Mobility status (observed mobility in hallway)         Time: [id number] PT Time Calculation (min) (ACUTE ONLY): 19 min   Charges:   PT Evaluation $Initial PT Evaluation Tier I: 1 Procedure     PT G Codes:        Angela Schmidt,Angela Schmidt, Angela Schmidt Angela Schmidt, PT,  DPT Schmidt Pager: [phone]

## 2015-08-03 NOTE — Progress Notes (Signed)
2 Days Post-Op Takedown of coloanal anastomosis and end colostomy creation Subjective: Pain controlled, UOP low, high ng output   Objective: Vital signs in last 24 hours: Temp:  [97.4 F (36.3 C)-98.1 F (36.7 C)] 97.4 F (36.3 C) (12/15 0610) Pulse Rate:  [68-105] 68 (12/15 0610) Resp:  [12-26] 14 (12/15 0800) BP: (78-92)/(36-63) 78/36 mmHg (12/15 0610) SpO2:  [90 %-95 %] 92 % (12/15 0800)   Intake/Output from previous day: 12/14 0701 - 12/15 0700 In: -  Out: 3035 [Urine:150; Emesis/NG output:2650; Drains:235] Intake/Output this shift: Total I/O In: -  Out: 250 [Urine:125; Drains:125]   General appearance: alert and cooperative GI: normal findings: soft, appropriately tender Ostomy: edematous NG: bilious output Incision: no significant drainage  Lab Results:   Recent Labs  08/02/15 0351 08/03/15 0403  WBC 12.7* 7.8  HGB 9.6* 8.6*  HCT 28.5* 26.4*  PLT 394 408*   BMET  Recent Labs  08/02/15 0351 08/03/15 0403  NA 131* 139  K 4.1 4.1  CL 99* 100*  CO2 25 33*  GLUCOSE 129* 99  BUN <5* 13  CREATININE 0.53 0.79  CALCIUM 7.5* 7.6*   PT/INR No results for input(s): LABPROT, INR in the last 72 hours. ABG No results for input(s): PHART, HCO3 in the last 72 hours.  Invalid input(s): PCO2, PO2  MEDS, Scheduled . enoxaparin (LOVENOX) injection  30 mg Subcutaneous Q24H  . ertapenem  1 g Intravenous Q24H  . HYDROmorphone   Intravenous 6 times per day  . mupirocin ointment  1 application Nasal BID  . nicotine  21 mg Transdermal Q24H    Studies/Results: No results found.  Assessment: s/p Procedure(s): SMALL BOWEL RESECTION, takedown of colorectal anastomosis with colostomy FLEXIBLE SIGMOIDOSCOPY with biopsies LAPAROSCOPY  Patient Active Problem List   Diagnosis Date Noted  . Pelvic abscess in female 07/29/2015  . Rectal stenosis 04/04/2015  . Rash and nonspecific skin eruption 04/04/2015  . Rectal pain 01/19/2015  . Physical exam 06/09/2014  .  Right thyroid nodule 03/07/2014  . Slow transit constipation 03/07/2014  . Personal history of malignant neoplasm of rectum 2014 02/21/2014  . Rectal cancer ([place]) 02/25/2013  . Screening for malignant neoplasm of the cervix 12/25/2012  . Diarrhea 12/25/2012    Expected post op course  Plan: PAS  Ambulate Cont NG Cont foley due to low UOP Cont Toradol prn to help with her pain control Cont IVF's, Given bolus for low uop, and MIV rate increased   LOS: 5 days     .Rosario Adie, [facility], [place]   08/03/2015 11:57 AM

## 2015-08-03 NOTE — Consult Note (Addendum)
WOC ostomy follow up Stoma type/location: LLQ Colosotmy Stomal assessment/size: 1 and 3/4 inches at "dome" of stoma, slightly smaller at base, red, moist edematous Peristomal assessment: intact, clear Treatment options for stomal/peristomal skin: skin barrier ring Output serosanguinous Ostomy pouching: 2pc. 2 and 1/4 inch pouching system with skin barrier ring Education provided: Patient and daughters observe new stoma pouch change. All taught that stoma is edematous and that size will diminish over the next few weeks.  Reinforced that sizing of stoma is of paramount importance to protect the parastomal skin. Skin barrier tape is trimmed away so that midline surgical dressing can remain in place a few more days. Enrolled patient in [place] Start Discharge program: Yes [place] nursing team will follow, and will remain available to this patient, the nursing, surgical and medical teams.   Thanks, Maudie Flakes, MSN, RN, [place], Arther Abbott  Pager# [phone]

## 2015-08-03 NOTE — Progress Notes (Signed)
Nutrition Follow-up  DOCUMENTATION CODES:   Severe malnutrition in context of chronic illness, Underweight  INTERVENTION:   Diet advancement per MD  Pt severely malnourished, if unable to advance diet, recommend nutrition support  RD to continue to monitor for plan and education needs requested by patient  NUTRITION DIAGNOSIS:   Malnutrition related to chronic illness as evidenced by percent weight loss, severe depletion of body fat, severe depletion of muscle mass.  Ongoing.  GOAL:   Patient will meet greater than or equal to 90% of their needs  Not meeting, NPO.  MONITOR:   PO intake, Diet advancement, Labs, Weight trends, Skin, I & O's    ASSESSMENT:   54 y.o. F with h/o rectal cancer. S/P LAR with coloanal anastomosis and diverting ileostomy. Closure delayed due to anastomotic disruption posteriorly. Now with several months of obstructive symptoms and worsening upper abd pain. Came in tonight with worsening of pain. No fevers. Some nausea noted. No appetite.  12/13: s/p SMALL BOWEL RESECTION, takedown of colorectal anastomosis with colostomy FLEXIBLE SIGMOIDOSCOPY WITH BIOPSY DIAGNOSTIC LAPAROSCOPY    Pt continues to be NPO, NGT in place for suction. Per RN, pt with a lot of output from NGT but not much from foley. Recommend diet advancement vs nutrition support if diet is not able to be advanced.  Labs reviewed.  Diet Order:  Diet NPO time specified Except for: Sips with Meds, Ice Chips  Skin:  Wound (see comment) (abdominal incision)  Last BM:  12/12  Height:   Ht Readings from Last 1 Encounters:  07/29/15 5\' 3"  (1.6 m)    Weight:   Wt Readings from Last 1 Encounters:  07/29/15 94 lb 8 oz (42.865 kg)    Ideal Body Weight:  52.3 kg  BMI:  Body mass index is 16.74 kg/(m^2).  Estimated Nutritional Needs:   Kcal:  1400-1600  Protein:  65-75g  Fluid:  1.5L/day  EDUCATION NEEDS:   Education needs addressed  Clayton Bibles, MS, RD,  LDN Pager: [phone] After Hours Pager: [phone]

## 2015-08-04 LAB — BASIC METABOLIC PANEL
Anion gap: 6 (ref 5–15)
BUN: 10 mg/dL (ref 6–20)
CHLORIDE: 102 mmol/L (ref 101–111)
CO2: 35 mmol/L — AB (ref 22–32)
Calcium: 7.6 mg/dL — ABNORMAL LOW (ref 8.9–10.3)
Creatinine, Ser: 0.68 mg/dL (ref 0.44–1.00)
GFR calc Af Amer: >60 mL/min (ref >60)
GFR calc non Af Amer: >60 mL/min (ref >60)
GLUCOSE: 103 mg/dL — AB (ref 65–99)
Potassium: 3.5 mmol/L (ref 3.5–5.1)
SODIUM: 143 mmol/L (ref 135–145)

## 2015-08-04 LAB — CBC
HEMATOCRIT: 23.6 % — AB (ref 36.0–46.0)
HEMOGLOBIN: 7.5 g/dL — AB (ref 12.0–15.0)
MCH: 29.4 pg (ref 26.0–34.0)
MCHC: 31.8 g/dL (ref 30.0–36.0)
MCV: 92.5 fL (ref 78.0–100.0)
Platelets: 353 10*3/uL (ref 150–400)
RBC: 2.55 MIL/uL — ABNORMAL LOW (ref 3.87–5.11)
RDW: 14.3 % (ref 11.5–15.5)
WBC: 7 10*3/uL (ref 4.0–10.5)

## 2015-08-04 NOTE — Progress Notes (Signed)
3 Days Post-Op Takedown of coloanal anastomosis and end colostomy creation Subjective: Pain controlled, UOP better, high ng output but pt taking in a lot of ice chips  Objective: Vital signs in last 24 hours: Temp:  [97.8 F (36.6 C)-98.3 F (36.8 C)] 98.3 F (36.8 C) (12/16 0425) Pulse Rate:  [87-92] 91 (12/16 0425) Resp:  [8-18] 8 (12/16 0810) BP: (83-94)/(40-58) 83/44 mmHg (12/16 0425) SpO2:  [90 %-100 %] 92 % (12/16 0810)   Intake/Output from previous day: 12/15 0701 - 12/16 0700 In: -  Out: [id number] [Urine:1050; Emesis/NG output:3875; Drains:330] Intake/Output this shift:    General appearance: alert and cooperative GI: normal findings: soft, appropriately tender Ostomy: edematous NG: dark bilious output Incision: no significant drainage  Lab Results:   Recent Labs  08/03/15 0403 08/04/15 0402  WBC 7.8 7.0  HGB 8.6* 7.5*  HCT 26.4* 23.6*  PLT 408* 353   BMET  Recent Labs  08/03/15 0403 08/04/15 0402  NA 139 143  K 4.1 3.5  CL 100* 102  CO2 33* 35*  GLUCOSE 99 103*  BUN 13 10  CREATININE 0.79 0.68  CALCIUM 7.6* 7.6*   PT/INR No results for input(s): LABPROT, INR in the last 72 hours. ABG No results for input(s): PHART, HCO3 in the last 72 hours.  Invalid input(s): PCO2, PO2  MEDS, Scheduled . enoxaparin (LOVENOX) injection  30 mg Subcutaneous Q24H  . ertapenem  1 g Intravenous Q24H  . HYDROmorphone   Intravenous 6 times per day  . mupirocin ointment  1 application Nasal BID  . nicotine  21 mg Transdermal Q24H    Studies/Results: No results found.  Assessment: s/p Procedure(s): SMALL BOWEL RESECTION, takedown of colorectal anastomosis with colostomy FLEXIBLE SIGMOIDOSCOPY with biopsies LAPAROSCOPY  Patient Active Problem List   Diagnosis Date Noted  . Pelvic abscess in female 07/29/2015  . Rectal stenosis 04/04/2015  . Rash and nonspecific skin eruption 04/04/2015  . Rectal pain 01/19/2015  . Physical exam 06/09/2014  . Right thyroid  nodule 03/07/2014  . Slow transit constipation 03/07/2014  . Personal history of malignant neoplasm of rectum 2014 02/21/2014  . Rectal cancer ([place]) 02/25/2013  . Screening for malignant neoplasm of the cervix 12/25/2012  . Diarrhea 12/25/2012    Expected post op course  Plan: PAS  Ambulate Clamp NG, if output ok, can d/c later today.  Ok for sips of clears afterwards D/c foley, uop good Cont Toradol prn to help with her pain control Cont IVF's until tolerating a diet   LOS: 6 days     .Rosario Adie, [facility], [place]   08/04/2015 10:37 AM

## 2015-08-04 NOTE — Progress Notes (Signed)
3 Days Post-Op Takedown of coloanal anastomosis and end colostomy creation Subjective: Pain controlled, UOP better, high ng output but pt taking in a lot of ice chips  Objective: Vital signs in last 24 hours: Temp:  [97.8 F (36.6 C)-98.3 F (36.8 C)] 98.3 F (36.8 C) (12/16 0425) Pulse Rate:  [87-92] 91 (12/16 0425) Resp:  [8-18] 8 (12/16 0810) BP: (83-94)/(40-58) 83/44 mmHg (12/16 0425) SpO2:  [90 %-100 %] 92 % (12/16 0810)   Intake/Output from previous day: 12/15 0701 - 12/16 0700 In: -  Out: [id number] [Urine:1050; Emesis/NG output:3875; Drains:330] Intake/Output this shift:    General appearance: alert and cooperative GI: normal findings: soft, appropriately tender Ostomy: edematous NG: dark bilious output Incision: no significant drainage  Lab Results:   Recent Labs  08/03/15 0403 08/04/15 0402  WBC 7.8 7.0  HGB 8.6* 7.5*  HCT 26.4* 23.6*  PLT 408* 353   BMET  Recent Labs  08/03/15 0403 08/04/15 0402  NA 139 143  K 4.1 3.5  CL 100* 102  CO2 33* 35*  GLUCOSE 99 103*  BUN 13 10  CREATININE 0.79 0.68  CALCIUM 7.6* 7.6*   PT/INR No results for input(s): LABPROT, INR in the last 72 hours. ABG No results for input(s): PHART, HCO3 in the last 72 hours.  Invalid input(s): PCO2, PO2  MEDS, Scheduled . enoxaparin (LOVENOX) injection  30 mg Subcutaneous Q24H  . ertapenem  1 g Intravenous Q24H  . HYDROmorphone   Intravenous 6 times per day  . mupirocin ointment  1 application Nasal BID  . nicotine  21 mg Transdermal Q24H    Studies/Results: No results found.  Assessment: s/p Procedure(s): SMALL BOWEL RESECTION, takedown of colorectal anastomosis with colostomy FLEXIBLE SIGMOIDOSCOPY with biopsies LAPAROSCOPY  Patient Active Problem List   Diagnosis Date Noted  . Pelvic abscess in female 07/29/2015  . Rectal stenosis 04/04/2015  . Rash and nonspecific skin eruption 04/04/2015  . Rectal pain 01/19/2015  . Physical exam 06/09/2014  . Right thyroid  nodule 03/07/2014  . Slow transit constipation 03/07/2014  . Personal history of malignant neoplasm of rectum 2014 02/21/2014  . Rectal cancer ([place]) 02/25/2013  . Screening for malignant neoplasm of the cervix 12/25/2012  . Diarrhea 12/25/2012    Expected post op course  Plan: PAS  Ambulate Clamp NG, if output ok, can d/c later today.  Ok for sips of clears afterwards D/c foley, uop good Cont Toradol prn to help with her pain control Cont IVF's until tolerating a diet Last dose of Invanz tom to complete a 7 day course   LOS: 6 days     .Rosario Adie, [facility], [place]   08/04/2015 10:40 AM

## 2015-08-04 NOTE — Progress Notes (Signed)
Checked residual of NG -- patient had 20cc residual.  NG tube removed per previous MD order.  Patient tolerated procedure well. Will continue to monitor.

## 2015-08-04 NOTE — Care Management Note (Signed)
Case Management Note  Patient Details  Name: Angela Schmidt MRN: [id number] Date of Birth: 1961/06/07  Subjective/Objective:   54 yo admitted with Pelvic abscess                 Action/Plan: From home with children  Expected Discharge Date:                  Expected Discharge Plan:  Home/Self Care  In-House Referral:     Discharge planning Services  CM Consult  Post Acute Care Choice:    Choice offered to:     DME Arranged:    DME Agency:     HH Arranged:    HH Agency:     Status of Service:  In process, will continue to follow  Medicare Important Message Given:    Date Medicare IM Given:    Medicare IM give by:    Date Additional Medicare IM Given:    Additional Medicare Important Message give by:     If discussed at [place] of Stay Meetings, dates discussed:    Additional Comments: Pt has new colostomy. This CM met with pt at bedside to discuss DC needs. Pt states she has had an ileostomy previously and feels comfortable caring for her new colostomy at home. Pt asked if she would like a HHRN to visit her at home after discharge. Pt states she doesn't need a HHRN at discharge. Home Health provider list left with pt in case she changes her mind. No other discharge needs communicated at this time. CM will continue to follow. Lynnell Catalan, RN 08/04/2015, 1:24 PM

## 2015-08-05 LAB — BASIC METABOLIC PANEL
ANION GAP: 5 (ref 5–15)
BUN: 7 mg/dL (ref 6–20)
CALCIUM: 7.6 mg/dL — AB (ref 8.9–10.3)
CO2: 27 mmol/L (ref 22–32)
CREATININE: 0.68 mg/dL (ref 0.44–1.00)
Chloride: 104 mmol/L (ref 101–111)
Glucose, Bld: 88 mg/dL (ref 65–99)
Potassium: 3.8 mmol/L (ref 3.5–5.1)
SODIUM: 136 mmol/L (ref 135–145)

## 2015-08-05 LAB — CBC
HCT: 22.3 % — ABNORMAL LOW (ref 36.0–46.0)
Hemoglobin: 7.2 g/dL — ABNORMAL LOW (ref 12.0–15.0)
MCH: 29.6 pg (ref 26.0–34.0)
MCHC: 32.3 g/dL (ref 30.0–36.0)
MCV: 91.8 fL (ref 78.0–100.0)
PLATELETS: 373 10*3/uL (ref 150–400)
RBC: 2.43 MIL/uL — ABNORMAL LOW (ref 3.87–5.11)
RDW: 14.3 % (ref 11.5–15.5)
WBC: 6.7 10*3/uL (ref 4.0–10.5)

## 2015-08-05 NOTE — Progress Notes (Signed)
4 Days Post-Op  Subjective: Complains of soreness but seems to be doing well  Objective: Vital signs in last 24 hours: Temp:  [97.8 F (36.6 C)-98.3 F (36.8 C)] 98.3 F (36.8 C) (12/17 0516) Pulse Rate:  [90-96] 90 (12/17 0516) Resp:  [11-22] 13 (12/17 0800) BP: (88-102)/(53-61) 93/53 mmHg (12/17 0516) SpO2:  [92 %-99 %] 99 % (12/17 0800) FiO2 (%):  [28 %-90 %] 28 % (12/17 0800) Last BM Date: 08/01/15  Intake/Output from previous day: 12/16 0701 - 12/17 0700 In: 1000 [I.V.:1000] Out: 1570 [Urine:850; Emesis/NG output:600; Drains:120] Intake/Output this shift:    Resp: clear to auscultation bilaterally Cardio: regular rate and rhythm GI: soft, mild to moderate tenderness. ostomy pink. she says it has put out air. incision ok  Lab Results:   Recent Labs  08/04/15 0402 08/05/15 0425  WBC 7.0 6.7  HGB 7.5* 7.2*  HCT 23.6* 22.3*  PLT 353 373   BMET  Recent Labs  08/04/15 0402 08/05/15 0425  NA 143 136  K 3.5 3.8  CL 102 104  CO2 35* 27  GLUCOSE 103* 88  BUN 10 7  CREATININE 0.68 0.68  CALCIUM 7.6* 7.6*   PT/INR No results for input(s): LABPROT, INR in the last 72 hours. ABG No results for input(s): PHART, HCO3 in the last 72 hours.  Invalid input(s): PCO2, PO2  Studies/Results: No results found.  Anti-infectives: Anti-infectives    Start     Dose/Rate Route Frequency Ordered Stop   07/29/15 2100  ertapenem [facility]) 1 g in sodium chloride 0.9 % 50 mL IVPB     1 g 100 mL/hr over 30 Minutes Intravenous Every 24 hours 07/29/15 2038 08/06/15 2059      Assessment/Plan: s/p Procedure(s): SMALL BOWEL RESECTION, takedown of colorectal anastomosis with colostomy (N/A) FLEXIBLE SIGMOIDOSCOPY with biopsies (N/A) LAPAROSCOPY  (N/A) Advance diet. Start clears today Ambulate Continue pca for pain  LOS: 7 days    TOTH III,PAUL S 08/05/2015

## 2015-08-06 NOTE — Plan of Care (Signed)
Problem: Activity: Goal: Risk for activity intolerance will decrease Outcome: Progressing Encourage ambulation as tolerated

## 2015-08-06 NOTE — Progress Notes (Signed)
5 Days Post-Op  Subjective: No complaints  Objective: Vital signs in last 24 hours: Temp:  [97.8 F (36.6 C)-98.9 F (37.2 C)] 98.9 F (37.2 C) (12/18 0557) Pulse Rate:  [77-96] 77 (12/18 0557) Resp:  [12-18] 16 (12/18 0800) BP: (80-84)/(49-54) 80/49 mmHg (12/18 0557) SpO2:  [94 %-100 %] 100 % (12/18 0800) FiO2 (%):  [21 %] 21 % (12/18 0800) Last BM Date: 08/06/15  Intake/Output from previous day: 12/17 0701 - 12/18 0700 In: 2240.8 [P.O.:680; I.V.:1560.8] Out: 640 [Urine:500; Drains:140] Intake/Output this shift:    Resp: clear to auscultation bilaterally Cardio: regular rate and rhythm GI: soft, mild tenderness. incision ok. ostomy pink with some small production  Lab Results:   Recent Labs  08/04/15 0402 08/05/15 0425  WBC 7.0 6.7  HGB 7.5* 7.2*  HCT 23.6* 22.3*  PLT 353 373   BMET  Recent Labs  08/04/15 0402 08/05/15 0425  NA 143 136  K 3.5 3.8  CL 102 104  CO2 35* 27  GLUCOSE 103* 88  BUN 10 7  CREATININE 0.68 0.68  CALCIUM 7.6* 7.6*   PT/INR No results for input(s): LABPROT, INR in the last 72 hours. ABG No results for input(s): PHART, HCO3 in the last 72 hours.  Invalid input(s): PCO2, PO2  Studies/Results: No results found.  Anti-infectives: Anti-infectives    Start     Dose/Rate Route Frequency Ordered Stop   07/29/15 2100  ertapenem [facility]) 1 g in sodium chloride 0.9 % 50 mL IVPB     1 g 100 mL/hr over 30 Minutes Intravenous Every 24 hours 07/29/15 2038 08/05/15 2107      Assessment/Plan: s/p Procedure(s): SMALL BOWEL RESECTION, takedown of colorectal anastomosis with colostomy (N/A) FLEXIBLE SIGMOIDOSCOPY with biopsies (N/A) LAPAROSCOPY  (N/A) Advance diet. Start soft foods today ambulate  LOS: 8 days    TOTH III,Shantia Sanford S 08/06/2015

## 2015-08-07 MED ORDER — OXYCODONE HCL 5 MG PO TABS
5.0000 mg | ORAL_TABLET | ORAL | Status: DC | PRN
  Administered 2015-08-07 – 2015-08-08 (×4): 10 mg via ORAL
  Administered 2015-08-08: 5 mg via ORAL
  Administered 2015-08-08: 10 mg via ORAL
  Filled 2015-08-07 (×7): qty 2

## 2015-08-07 MED ORDER — ADULT MULTIVITAMIN LIQUID CH
5.0000 mL | Freq: Every day | ORAL | Status: DC
  Administered 2015-08-07 – 2015-08-08 (×2): 5 mL via ORAL
  Filled 2015-08-07 (×2): qty 5

## 2015-08-07 MED ORDER — HYDROMORPHONE HCL 1 MG/ML IJ SOLN
0.5000 mg | INTRAMUSCULAR | Status: DC | PRN
  Administered 2015-08-07: 0.5 mg via INTRAVENOUS
  Filled 2015-08-07: qty 1

## 2015-08-07 MED ORDER — PROMETHAZINE HCL 25 MG/ML IJ SOLN
12.5000 mg | INTRAMUSCULAR | Status: DC | PRN
  Administered 2015-08-07: 12.5 mg via INTRAVENOUS
  Filled 2015-08-07: qty 1

## 2015-08-07 MED ORDER — HYDROMORPHONE HCL 1 MG/ML IJ SOLN
0.5000 mg | INTRAMUSCULAR | Status: AC
  Administered 2015-08-07: 0.5 mg via INTRAVENOUS
  Filled 2015-08-07: qty 1

## 2015-08-07 NOTE — Progress Notes (Signed)
Nutrition Follow-up  DOCUMENTATION CODES:   Severe malnutrition in context of chronic illness, Underweight  INTERVENTION:   Provide "Colostomy Nutrition Therapy" handout from [employer]. Multivitamin with minerals daily RD to continue to monitor  NUTRITION DIAGNOSIS:   Malnutrition related to chronic illness as evidenced by percent weight loss, severe depletion of body fat, severe depletion of muscle mass.  Ongoing.  GOAL:   Patient will meet greater than or equal to 90% of their needs  Progressing.  MONITOR:   PO intake, Labs, Weight trends, Skin, I & O's  REASON FOR ASSESSMENT:   Malnutrition Screening Tool, Other (Comment) (Low BMI)    ASSESSMENT:   54 y.o. F with h/o rectal cancer. S/P LAR with coloanal anastomosis and diverting ileostomy. Closure delayed due to anastomotic disruption posteriorly. Now with several months of obstructive symptoms and worsening upper abd pain. Came in tonight with worsening of pain. No fevers. Some nausea noted. No appetite.  Pt now on soft diet and tolerating with no issues. Pt in room with daughter at bedside. Reviewed "Colostomy Nutrition Therapy" handout with pt and daughter. Pt very appreciative of information.  Pt not interested in nutritional supplements at this time. Encouraged use of daily multi-vitamin at home.  Labs reviewed.  Diet Order:  DIET SOFT Room service appropriate?: Yes; Fluid consistency:: Thin  Skin:  Wound (see comment) (abdominal incision)  Last BM:  12/18  Height:   Ht Readings from Last 1 Encounters:  07/29/15 5\' 3"  (1.6 m)    Weight:   Wt Readings from Last 1 Encounters:  07/29/15 94 lb 8 oz (42.865 kg)    Ideal Body Weight:  52.3 kg  BMI:  Body mass index is 16.74 kg/(m^2).  Estimated Nutritional Needs:   Kcal:  1400-1600  Protein:  65-75g  Fluid:  1.5L/day  EDUCATION NEEDS:   Education needs addressed  Clayton Bibles, MS, RD, LDN Pager:  [phone] After Hours Pager: [phone]

## 2015-08-07 NOTE — Progress Notes (Signed)
6 Days Post-Op Takedown of coloanal anastomosis and end colostomy creation Subjective: Pain controlled, tolerating a soft diet, some bowel function noted on Sat  Objective: Vital signs in last 24 hours: Temp:  [97 F (36.1 C)-99.1 F (37.3 C)] 99.1 F (37.3 C) (12/19 0540) Pulse Rate:  [91-94] 94 (12/19 0540) Resp:  [14-21] 21 (12/19 0540) BP: (85-99)/(48-55) 93/48 mmHg (12/19 0540) SpO2:  [95 %-100 %] 96 % (12/19 0540) FiO2 (%):  [21 %] 21 % (12/18 1600)   Intake/Output from previous day: 12/18 0701 - 12/19 0700 In: 600 [I.V.:600] Out: 27 [Drains:50] Intake/Output this shift:   General appearance: alert and cooperative GI: normal findings: soft, appropriately tender Ostomy: edematous Incision: no significant drainage JP: cloudy serosanguinous output  Lab Results:   Recent Labs  08/05/15 0425  WBC 6.7  HGB 7.2*  HCT 22.3*  PLT 373   BMET  Recent Labs  08/05/15 0425  NA 136  K 3.8  CL 104  CO2 27  GLUCOSE 88  BUN 7  CREATININE 0.68  CALCIUM 7.6*   PT/INR No results for input(s): LABPROT, INR in the last 72 hours. ABG No results for input(s): PHART, HCO3 in the last 72 hours.  Invalid input(s): PCO2, PO2  MEDS, Scheduled . enoxaparin (LOVENOX) injection  30 mg Subcutaneous Q24H  . nicotine  21 mg Transdermal Q24H    Studies/Results: No results found.  Assessment: s/p Procedure(s): SMALL BOWEL RESECTION, takedown of colorectal anastomosis with colostomy FLEXIBLE SIGMOIDOSCOPY with biopsies LAPAROSCOPY  Patient Active Problem List   Diagnosis Date Noted  . Pelvic abscess in female 07/29/2015  . Rectal stenosis 04/04/2015  . Rash and nonspecific skin eruption 04/04/2015  . Rectal pain 01/19/2015  . Physical exam 06/09/2014  . Right thyroid nodule 03/07/2014  . Slow transit constipation 03/07/2014  . Personal history of malignant neoplasm of rectum 2014 02/21/2014  . Rectal cancer ([place]) 02/25/2013  . Screening for malignant neoplasm of the  cervix 12/25/2012  . Diarrhea 12/25/2012    Expected post op course  Plan: PAS  Ambulate D/C PCA, transition to PO pain meds SL IV Cont JP Possible D/C tom   LOS: 9 days     .Rosario Adie, [facility], [place]   08/07/2015 7:39 AM

## 2015-08-07 NOTE — Consult Note (Addendum)
WOC ostomy follow up Stoma type/location: LLQ COlosotmy Stomal assessment/size: 1 and 3/4 inches at "dome" of stoma, slightly smaller at base. Peristomal assessment: intact, clear.  Patient, who had "itching" with ileostomy, is not having any itching with this stoma of this equipment (which is the same used previously). She may have had some contact dermatitis from parastomal leakage and the itching was related to that with the ileostomy stoma. Treatment options for stomal/peristomal skin: Skin barrier ring Output flatus and thin yellow effluent in pouch today, reports soft brown BM over weekend Ostomy pouching: 2pc. 2 and 1/4 inch pouching system with skin barrier ring. Education provided: Patient places new pouching system nearly independently today and 7 set-ups of supplies are provided in the event supply orders are unpredictable over the holidays. She has no additional questions. States that colostomy booklet was read and that daughters took it home. Enrolled patient in [facility] Discharge program: Yes, previously and updated last week. An additional pair of scissors (rounded) is requested today. [place] nursing team will remain available to this patient, the nursing, surgical and medical teams.  Patinet is ready for discharge from a [place] nurse standpoint. Maudie Flakes, MSN, RN, [place], Arther Abbott  Pager# [phone]

## 2015-08-07 NOTE — Discharge Summary (Signed)
Physician Discharge Summary  Patient ID: Reese Clarkin MRN: [id number] DOB/AGE: [birth year] 54 y.o.  Admit date: 07/29/2015 Discharge date: 08/08/2015  Admission Diagnoses: Pelvic abscess  Discharge Diagnoses:  Coloanal anastomosis disruption  Discharged Condition: good  Hospital Course: Patient was admitted from the ED after CT showed large posterior pelvic abscess.  She was admitted to the hospital and placed on IV antibiotics.  We obtained a complete metastatic work up for her rectal cancer.  This was all negative.  She underwent open takedown of her coloanal anastomosis with pelvic washout and biopsies.  Biopsies were negative for any cancer.  She recovered well in the hospital.  Once she was tolerating a diet and PO narcotics, she was discharged to home.  She was discharged with her surgically placed pelvic drain to be removed later in the office.    Consults: None  Significant Diagnostic Studies: labs: cbc, chemistry  Treatments: IV hydration, antibiotics: Invanz, analgesia: Dilaudid and surgery: see above  Discharge Exam: Blood pressure 83/59, pulse 77, temperature 97.9 F (36.6 C), temperature source Oral, resp. rate 16, height 5\' 3"  (1.6 m), weight 42.865 kg (94 lb 8 oz), SpO2 97 %. General appearance: alert and cooperative GI: soft, non-tender; bowel sounds normal; no masses,  no organomegaly Incision/Wound: clean, dry, no erythema ostomy edematous but pink  Disposition: 01-Home or Self Care     Medication List    STOP taking these medications        polyethylene glycol powder powder  Commonly known as:  GLYCOLAX/MIRALAX     predniSONE 10 MG (21) Tbpk tablet  Commonly known as:  STERAPRED UNI-PAK 21 TAB     prochlorperazine 10 MG tablet  Commonly known as:  COMPAZINE     traMADol 50 MG tablet  Commonly known as:  ULTRAM      TAKE these medications        docusate sodium 100 MG capsule  Commonly known as:  COLACE  Take 100 mg by mouth daily as needed  for mild constipation.     ibuprofen 200 MG tablet  Commonly known as:  ADVIL,MOTRIN  Take 400 mg by mouth every 6 (six) hours as needed (Pain).     multivitamin with minerals Tabs tablet  Take 1 tablet by mouth daily.     nicotine 21 mg/24hr patch  Commonly known as:  NICODERM CQ - dosed in mg/24 hours  Place 1 patch onto the skin daily.     nystatin ointment  Commonly known as:  MYCOSTATIN  Apply 1 application topically 2 (two) times daily.     ondansetron 8 MG tablet  Commonly known as:  ZOFRAN  Take 1 tablet (8 mg total) by mouth every 8 (eight) hours as needed for nausea.     oxyCODONE 5 MG immediate release tablet  Commonly known as:  Oxy IR/ROXICODONE  Take 1-2 tablets (5-10 mg total) by mouth every 4 (four) hours as needed for moderate pain, severe pain or breakthrough pain.     Vitamin D (Ergocalciferol) 50000 UNITS Caps capsule  Commonly known as:  DRISDOL  Take 1 capsule (50,000 Units total) by mouth every 7 (seven) days.       Follow-up Information    Follow up with Rosario Adie., MD. Schedule an appointment as soon as possible for a visit in 2 weeks.   Specialty:  General Surgery   Contact information:   [address] [place] [address] [phone]       Signed:  Wakeelah Solan C. [id number], 9:01 AM

## 2015-08-08 MED ORDER — OXYCODONE HCL 5 MG PO TABS: 5.0000 mg | ORAL_TABLET | ORAL | Status: DC | PRN

## 2015-08-08 NOTE — Discharge Instructions (Signed)
ABDOMINAL SURGERY: POST OP INSTRUCTIONS ° °1. DIET: Follow a light bland diet the first 24 hours after arrival home, such as soup, liquids, crackers, etc.  Be sure to include lots of fluids daily.  Avoid fast food or heavy meals as your are more likely to get nauseated.  Eat a low fat the next few days after surgery.   °2. Take your usually prescribed home medications unless otherwise directed. °3. PAIN CONTROL: °a. Pain is best controlled by a usual combination of three different methods TOGETHER: °i. Ice/Heat °ii. Over the counter pain medication °iii. Prescription pain medication °b. Most patients will experience some swelling and bruising around the incisions.  Ice packs or heating pads (30-60 minutes up to 6 times a day) will help. Use ice for the first few days to help decrease swelling and bruising, then switch to heat to help relax tight/sore spots and speed recovery.  Some people prefer to use ice alone, heat alone, alternating between ice & heat.  Experiment to what works for you.  Swelling and bruising can take several weeks to resolve.   °c. It is helpful to take an over-the-counter pain medication regularly for the first few weeks.  Choose one of the following that works best for you: °i. Naproxen (Aleve, etc)  Two 220mg tabs twice a day °ii. Ibuprofen (Advil, etc) Three 200mg tabs four times a day (every meal & bedtime) °iii. Acetaminophen (Tylenol, etc) 500-650mg four times a day (every meal & bedtime) °d. A  prescription for pain medication (such as oxycodone, hydrocodone, etc) should be given to you upon discharge.  Take your pain medication as prescribed.  °i. If you are having problems/concerns with the prescription medicine (does not control pain, nausea, vomiting, rash, itching, etc), please call us [phone] to see if we need to switch you to a different pain medicine that will work better for you and/or control your side effect better. °ii. If you need a refill on your pain medication,  please contact your pharmacy.  They will contact our office to request authorization. Prescriptions will not be filled after 5 pm or on week-ends. °4. Avoid getting constipated.  Between the surgery and the pain medications, it is common to experience some constipation.  Increasing fluid intake and taking a fiber supplement (such as Metamucil, Citrucel, FiberCon, MiraLax, etc) 1-2 times a day regularly will usually help prevent this problem from occurring.  A mild laxative (prune juice, Milk of Magnesia, MiraLax, etc) should be taken according to package directions if there are no bowel movements after 48 hours.   °5. Watch out for diarrhea.  If you have many loose bowel movements, simplify your diet to bland foods & liquids for a few days.  Stop any stool softeners and decrease your fiber supplement.  Switching to mild anti-diarrheal medications (Kayopectate, Pepto Bismol) can help.  If this worsens or does not improve, please call us. °6. Wash / shower every day.  You may shower over the incision / wound.  Avoid baths until the skin is fully healed.  Continue to shower over incision(s) after the dressing is off. °7. Remove your waterproof bandages 5 days after surgery.  You may leave the incision open to air.  You may replace a dressing/Band-Aid to cover the incision for comfort if you wish. °8. ACTIVITIES as tolerated:   °a. You may resume regular (light) daily activities beginning the next day--such as daily self-care, walking, climbing stairs--gradually increasing activities as tolerated.  If you can   walk 30 minutes without difficulty, it is safe to try more intense activity such as jogging, treadmill, bicycling, low-impact aerobics, swimming, etc. b. Save the most intensive and strenuous activity for last such as sit-ups, heavy lifting, contact sports, etc  Refrain from any heavy lifting or straining until you are off narcotics for pain control.   c. DO NOT PUSH THROUGH PAIN.  Let pain be your guide: If it  hurts to do something, don't do it.  Pain is your body warning you to avoid that activity for another week until the pain goes down. d. You may drive when you are no longer taking prescription pain medication, you can comfortably wear a seatbelt, and you can safely maneuver your car and apply brakes. e. Dennis Bast may have sexual intercourse when it is comfortable.  9. FOLLOW UP in our office a. Please call [facility] at [phone] to set up an appointment to see your surgeon in the office for a follow-up appointment approximately 1-2 weeks after your surgery. b. Make sure that you call for this appointment the day you arrive home to insure a convenient appointment time. 10. IF YOU HAVE DISABILITY OR FAMILY LEAVE FORMS, BRING THEM TO THE OFFICE FOR PROCESSING.  DO NOT GIVE THEM TO YOUR DOCTOR. 11.       Empty JP daily and change dressing daily.     WHEN TO CALL us [phone]: 1. Poor pain control 2. Reactions / problems with new medications (rash/itching, nausea, etc)  3. Fever over 101.5 F (38.5 C) 4. Inability to urinate 5. Nausea and/or vomiting 6. Worsening swelling or bruising 7. Continued bleeding from incision. 8. Increased pain, redness, or drainage from the incision  The clinic staff is available to answer your questions during regular business hours (8:30am-5pm).  Please dont hesitate to call and ask to speak to one of our nurses for clinical concerns.   A surgeon from [facility] is always on call at the hospitals   If you have a medical emergency, go to the nearest emergency room or call 911.    [facility], [place], [place], [place], [place]  [address] ? MAIN: [phone] ? TOLL FREE: [phone] ? FAX [phone] [email or web address]

## 2015-09-22 ENCOUNTER — Other Ambulatory Visit: Payer: Self-pay | Admitting: Family Medicine

## 2015-09-22 NOTE — Telephone Encounter (Signed)
Med denied, pt should continue OTC vitamin D 2000iu daily.  

## 2015-12-19 ENCOUNTER — Other Ambulatory Visit: Payer: Self-pay | Admitting: *Deleted

## 2015-12-19 NOTE — Progress Notes (Signed)
Received office note from Dr. Marcello Moores, she saw pt 5/1. Pt is due for follow up in this office. Reviewed with Dr. Benay Spice: orders entered for office visit next 1-2 mos, per MD.

## 2015-12-20 ENCOUNTER — Telehealth: Payer: Self-pay | Admitting: Oncology

## 2015-12-20 NOTE — Telephone Encounter (Signed)
LEFT MSG CONFIRMING 6/26 APPT

## 2016-02-12 ENCOUNTER — Ambulatory Visit (HOSPITAL_BASED_OUTPATIENT_CLINIC_OR_DEPARTMENT_OTHER): Payer: BLUE CROSS/BLUE SHIELD | Admitting: Oncology

## 2016-02-12 VITALS — BP 108/68 | HR 86 | Temp 97.7°F | Resp 18 | Ht 63.0 in | Wt 111.9 lb

## 2016-02-12 DIAGNOSIS — C20 Malignant neoplasm of rectum: Secondary | ICD-10-CM | POA: Diagnosis not present

## 2016-02-12 DIAGNOSIS — Z720 Tobacco use: Secondary | ICD-10-CM

## 2016-02-12 NOTE — Progress Notes (Signed)
  [facility] OFFICE PROGRESS NOTE   Diagnosis: Rectal cancer  INTERVAL HISTORY:   Angela Schmidt was last seen at [facility] in July 2015. She was diagnosed with a pelvic abscess in December 2016. Marland Kitchen She was taken to the operating room by Dr. Marcello Moores and was noted to have an abscess cavity and posterior anastomotic disruption. She underwent a small bowel resection with takedown of the colorectal anastomosis and creation of a colostomy on 08/01/2015.  She reports feeling "100% "better since placement of the colostomy. The colostomy is functioning well. Good appetite. She continues smoking.  Objective:  Vital signs in last 24 hours:  Blood pressure 108/68, pulse 86, temperature 97.7 F (36.5 C), temperature source Oral, resp. rate 18, height 5\' 3"  (1.6 m), weight 111 lb 14.4 oz (50.758 kg), SpO2 100 %.    HEENT: Neck without mass Lymphatics: No cervical, supraclavicular, axillary, or inguinal nodes Resp: Distant breath sounds, no respiratory distress Cardio: Regular rate and rhythm GI: Left lower quadrant colostomy, no hepatomegaly, no mass, nontender Vascular: No leg edema  Medications: I have reviewed the patient's current medications.  Assessment/Plan: 1. Rectal cancer, clinical stage III (uT3 uN1) measured at 7 cm from the anal verge.  No evidence for metastatic disease on CTs of the chest, abdomen and pelvis 02/24/2013.   Elevated CEA 02/22/2013 (13.6). Repeat CEA 08/26/2013 normal (1.1).   Initiation of neoadjuvant Xeloda and radiation on 03/22/2013, completed 04/29/2013.   Status post a low anterior resection and diverting ileostomy on 06/14/2013-ypT2,ypN0.   Cycle 1 adjuvant Xeloda beginning 07/16/2013.   Cycle 2 adjuvant Xeloda beginning 08/06/2013.   Cycle 3 adjuvant Xeloda beginning 08/27/2013.   Cycle 4 adjuvant Xeloda beginning 09/17/2013.   Cycle 5 adjuvant Xeloda beginning 10/08/2013.  Colonoscopy 02/21/2014. Evidence of prior  surgical anastomosis in the anal canal, moderate stenosis and a small opening in the mucosa of unclear significance if any. No signs of cancer/polyps. Colon mucosa otherwise normal. Repeat colonoscopy recommended in 3 years.  02/23/2014 CEA 1.5.  CT chest/abdomen/pelvis 02/23/2014. Postsurgical changes related to the low anterior resection, diverting ileostomy and subsequent takedown. Radiation changes within the pelvis without definite residual disease. No distant metastases identified.  CT abdomen/pelvis 07/29/2015-inflammatory change involving the rectum with extraluminal air  CT of the chest 07/31/2015-no adenopathy, no nodules 2. "Rheumatoid" arthritis. 3. Tobacco use. 4. Trachea/bronchial nodularity noted on the chest CT 02/24/2013. 5. History of hand-foot syndrome with dryness over the palms. 6. Ileostomy reversal 11/18/2013. 7. 8 mm low-density lesion right thyroid identified on chest CT 02/23/2014. 8. Coloanal anastomosis disruption December 2016-takedown of coloanal anastomosis, colostomy creation 08/01/2015, pathology negative for malignancy   Disposition:  Angela Schmidt is in clinical remission from rectal cancer. She is now 3 years out from diagnosis. She developed breakdown of the coloanal anastomosis in December and underwent a bowel resection and creation of a permanent colostomy. She now feels well.  She will return for a CEA within the next few weeks. She'll be scheduled for an office visit in 6 months.  I recommended she discontinue smoking.  Betsy Coder, MD  02/12/2016  4:23 PM

## 2016-02-13 ENCOUNTER — Telehealth: Payer: Self-pay | Admitting: Oncology

## 2016-02-13 NOTE — Telephone Encounter (Signed)
per pof to sch pt appt-gave pt copy of avs °

## 2016-02-13 NOTE — Telephone Encounter (Signed)
pt cl;d to get sch appt-gave appt for 7/10&12/21-pt aware

## 2016-02-26 ENCOUNTER — Other Ambulatory Visit: Payer: BLUE CROSS/BLUE SHIELD

## 2016-02-26 DIAGNOSIS — C20 Malignant neoplasm of rectum: Secondary | ICD-10-CM

## 2016-02-27 LAB — CEA (PARALLEL TESTING): CEA: 1.1 ng/mL

## 2016-02-28 ENCOUNTER — Telehealth: Payer: Self-pay

## 2016-02-28 NOTE — Telephone Encounter (Signed)
-----   Message from Ladell Pier, MD sent at 02/27/2016  4:31 PM EDT ----- Please call patient, cea is normal

## 2016-02-28 NOTE — Telephone Encounter (Signed)
Called and left message for patient to call back regarding lab results.  

## 2016-02-28 NOTE — Telephone Encounter (Signed)
Per Dr. Benay Spice, pt notified that CEA results are normal.  Pt appreciative of call and has no questions or concerns at this time.

## 2016-03-04 ENCOUNTER — Ambulatory Visit: Payer: BLUE CROSS/BLUE SHIELD | Admitting: Family Medicine

## 2016-03-21 ENCOUNTER — Ambulatory Visit: Payer: BLUE CROSS/BLUE SHIELD | Admitting: Family Medicine

## 2016-04-18 ENCOUNTER — Ambulatory Visit: Payer: BLUE CROSS/BLUE SHIELD | Admitting: Family Medicine

## 2016-04-19 ENCOUNTER — Other Ambulatory Visit (HOSPITAL_COMMUNITY)
Admission: RE | Admit: 2016-04-19 | Discharge: 2016-04-19 | Disposition: A | Discharge status: Home / Self Care | Payer: BLUE CROSS/BLUE SHIELD | Source: Ambulatory Visit | Attending: Nurse Practitioner | Admitting: Nurse Practitioner

## 2016-04-19 ENCOUNTER — Encounter: Payer: Self-pay | Admitting: Nurse Practitioner

## 2016-04-19 ENCOUNTER — Other Ambulatory Visit: Payer: Self-pay | Admitting: Nurse Practitioner

## 2016-04-19 ENCOUNTER — Other Ambulatory Visit (INDEPENDENT_AMBULATORY_CARE_PROVIDER_SITE_OTHER): Payer: BLUE CROSS/BLUE SHIELD

## 2016-04-19 ENCOUNTER — Ambulatory Visit (INDEPENDENT_AMBULATORY_CARE_PROVIDER_SITE_OTHER): Payer: BLUE CROSS/BLUE SHIELD | Admitting: Nurse Practitioner

## 2016-04-19 VITALS — BP 108/62 | HR 90 | Temp 97.7°F | Ht 63.0 in | Wt 115.2 lb

## 2016-04-19 DIAGNOSIS — Z0001 Encounter for general adult medical examination with abnormal findings: Secondary | ICD-10-CM | POA: Diagnosis not present

## 2016-04-19 DIAGNOSIS — E559 Vitamin D deficiency, unspecified: Secondary | ICD-10-CM | POA: Insufficient documentation

## 2016-04-19 DIAGNOSIS — Z01419 Encounter for gynecological examination (general) (routine) without abnormal findings: Secondary | ICD-10-CM | POA: Insufficient documentation

## 2016-04-19 DIAGNOSIS — Z1231 Encounter for screening mammogram for malignant neoplasm of breast: Secondary | ICD-10-CM

## 2016-04-19 DIAGNOSIS — Z0000 Encounter for general adult medical examination without abnormal findings: Secondary | ICD-10-CM | POA: Diagnosis not present

## 2016-04-19 DIAGNOSIS — R6889 Other general symptoms and signs: Secondary | ICD-10-CM

## 2016-04-19 DIAGNOSIS — M549 Dorsalgia, unspecified: Secondary | ICD-10-CM

## 2016-04-19 DIAGNOSIS — Z1151 Encounter for screening for human papillomavirus (HPV): Secondary | ICD-10-CM | POA: Insufficient documentation

## 2016-04-19 DIAGNOSIS — D649 Anemia, unspecified: Secondary | ICD-10-CM | POA: Diagnosis not present

## 2016-04-19 DIAGNOSIS — G8929 Other chronic pain: Secondary | ICD-10-CM | POA: Insufficient documentation

## 2016-04-19 DIAGNOSIS — Z933 Colostomy status: Secondary | ICD-10-CM | POA: Insufficient documentation

## 2016-04-19 DIAGNOSIS — E041 Nontoxic single thyroid nodule: Secondary | ICD-10-CM

## 2016-04-19 DIAGNOSIS — F17200 Nicotine dependence, unspecified, uncomplicated: Secondary | ICD-10-CM | POA: Insufficient documentation

## 2016-04-19 DIAGNOSIS — Z1239 Encounter for other screening for malignant neoplasm of breast: Secondary | ICD-10-CM

## 2016-04-19 DIAGNOSIS — Z1159 Encounter for screening for other viral diseases: Secondary | ICD-10-CM

## 2016-04-19 LAB — CBC WITH DIFFERENTIAL/PLATELET
BASOS ABS: 0 10*3/uL (ref 0.0–0.1)
BASOS PCT: 0.4 % (ref 0.0–3.0)
EOS PCT: 2 % (ref 0.0–5.0)
Eosinophils Absolute: 0.1 10*3/uL (ref 0.0–0.7)
HEMATOCRIT: 43.3 % (ref 36.0–46.0)
Hemoglobin: 14.9 g/dL (ref 12.0–15.0)
LYMPHS ABS: 1.5 10*3/uL (ref 0.7–4.0)
Lymphocytes Relative: 21.4 % (ref 12.0–46.0)
MCHC: 34.3 g/dL (ref 30.0–36.0)
MCV: 88.4 fl (ref 78.0–100.0)
Monocytes Absolute: 0.6 10*3/uL (ref 0.1–1.0)
Monocytes Relative: 8.9 % (ref 3.0–12.0)
NEUTROS ABS: 4.8 10*3/uL (ref 1.4–7.7)
NEUTROS PCT: 67.3 % (ref 43.0–77.0)
PLATELETS: 312 10*3/uL (ref 150.0–400.0)
RBC: 4.89 Mil/uL (ref 3.87–5.11)
RDW: 14.9 % (ref 11.5–15.5)
WBC: 7.1 10*3/uL (ref 4.0–10.5)

## 2016-04-19 LAB — COMPREHENSIVE METABOLIC PANEL
ALT: 13 U/L (ref 0–35)
AST: 15 U/L (ref 0–37)
Albumin: 4 g/dL (ref 3.5–5.2)
Alkaline Phosphatase: 103 U/L (ref 39–117)
BILIRUBIN TOTAL: 0.4 mg/dL (ref 0.2–1.2)
BUN: 14 mg/dL (ref 6–23)
CALCIUM: 9.8 mg/dL (ref 8.4–10.5)
CHLORIDE: 103 meq/L (ref 96–112)
CO2: 30 meq/L (ref 19–32)
Creatinine, Ser: 0.81 mg/dL (ref 0.40–1.20)
GFR: 78.19 mL/min (ref >60.00)
GLUCOSE: 85 mg/dL (ref 70–99)
POTASSIUM: 4.8 meq/L (ref 3.5–5.1)
Sodium: 138 mEq/L (ref 135–145)
Total Protein: 7.5 g/dL (ref 6.0–8.3)

## 2016-04-19 LAB — HIV ANTIBODY (ROUTINE TESTING W REFLEX): HIV: NONREACTIVE

## 2016-04-19 LAB — T4, FREE: Free T4: 0.77 ng/dL (ref 0.60–1.60)

## 2016-04-19 LAB — LIPID PANEL
CHOL/HDL RATIO: 3
Cholesterol: 177 mg/dL (ref 0–200)
HDL: 61.4 mg/dL (ref >39.00)
LDL Cholesterol: 82 mg/dL (ref 0–99)
NONHDL: 115.18
TRIGLYCERIDES: 166 mg/dL — AB (ref 0.0–149.0)
VLDL: 33.2 mg/dL (ref 0.0–40.0)

## 2016-04-19 LAB — HEPATITIS C ANTIBODY: HCV Ab: NEGATIVE

## 2016-04-19 LAB — TSH: TSH: 0.84 u[IU]/mL (ref 0.35–4.50)

## 2016-04-19 NOTE — Assessment & Plan Note (Signed)
Currently managed by orthopedics with use of tramadol.

## 2016-04-19 NOTE — Assessment & Plan Note (Signed)
Secondary to colorectal cancer.

## 2016-04-19 NOTE — Progress Notes (Signed)
Pre visit review using our clinic review tool, if applicable. No additional management support is needed unless otherwise documented below in the visit note. 

## 2016-04-19 NOTE — Assessment & Plan Note (Signed)
Currently taking 1000 Iu. Vitamin D level today.

## 2016-04-19 NOTE — Assessment & Plan Note (Signed)
Palpable nodule on right side. Will check TSH and free T4. 8 mm nodule noted on CT chest July 2015. Ultrasound or biopsy done in the past.

## 2016-04-19 NOTE — Assessment & Plan Note (Signed)
Wants to continue use of nicotine patch to cut down tobacco use.

## 2016-04-19 NOTE — Progress Notes (Addendum)
Subjective:    Patient ID: Angela Schmidt, female    DOB: [birth year], 55 y.o.   MRN: [id number]  Patient presents today for complete physical and transfer care from Dr. Paticia Stack.  HPI She denies any complaints today.  Colorectal Cancer: In remission for 3 years. Last saw oncologist Dr. Benay Spice June 2007. Has colostomy bag, gets supplies from [place]. Denies any difficulty with managing colostomy. Denies any GI or GU symptoms. Denies any signs of bleeding.  Thyroid Nodule: Denies any palpitations, unintentional weight gain or loss, edema, sore throat, change in voice, dysphagia.  Tobacco use: Currently using nicotine patch to quit. Has decreased walking to 1/2pk per day. She wants to continue using nicotine patch to decrease tobacco use. Has never used any other methods to quit. Denies any cough, shortness of breath, chest pain.  Anemia: Denies any increased fatigue, weakness, signs of GI bleeding.  Immunizations: (TDAP, Hep C screen, Pneumovax, Influenza, zoster)  Health Maintenance  Topic Date Due  . Flu Shot  03/19/2016  . Tetanus Vaccine  05/19/2016*  .  Hepatitis C: One time screening is recommended by Center for Disease Control  (CDC) for  adults born from [birth year] through 1964.   05/19/2016*  . HIV Screening  05/19/2016*  . Mammogram  06/19/2016  . Colon Cancer Screening  03/02/2018  . Pap Smear  04/20/2019  *Topic was postponed. The date shown is not the original due date.   Diet:none Weight: not concerned Wt Readings from Last 3 Encounters:  04/19/16 115 lb 4 oz (52.3 kg)  02/12/16 111 lb 14.4 oz (50.8 kg)  07/29/15 94 lb 8 oz (42.9 kg)    Exercise:waliking Fall Risk: none Fall Risk  06/14/2015 01/19/2015 03/07/2014 02/24/2014  Falls in the past year? No No No No   Depression/Suicide: none Depression screen [facility] 2/9 06/14/2015 01/19/2015 03/07/2014 12/25/2012  Decreased Interest 0 0 0 0  Down, Depressed, Hopeless 0 0 0 0  PHQ - 2 Score 0 0 0 0   No flowsheet  data found. Colonoscopy (every 5-49yrs, >50-46yrs): 2016 (normal per patient) Pap Smear (every 56yrs for >21-29 without HPV, every 59yrs for >30-31yrs with HPV):2014 (normal), need repeat today. Mammogram (yearly, >43yrs):2016, normal, repeat 06/2016 Vision: Needed, patient will schedule Dental:upper and lower dentures Advanced Directive: Advanced Directives 02/12/2016  Does patient have an advance directive? No  Type of Advance Directive -  Does patient want to make changes to advanced directive? -  Copy of advanced directive(s) in chart? -  Would patient like information on creating an advanced directive? No - patient declined information  Pre-existing out of facility DNR order (yellow form or pink MOST form) -   Sexual History (birth control, marital status, STD):single, not sexually active, agrees to HIV and hep C screeing.  Chronic back pain (secondary to OA: Stable per patient with use of tramadol. Pain is currently being managed by orthopedics. We'll maintain up appointments with orthopedics to continue tramadol prescription.  Medications and allergies reviewed with patient and updated if appropriate.  Patient Active Problem List   Diagnosis Date Noted  . Vitamin D insufficiency 04/19/2016  . Tobacco use disorder 04/19/2016  . Absolute anemia 04/19/2016  . Breast cancer screening, high risk patient 04/19/2016  . Need for hepatitis C screening test 04/19/2016  . Chronic back pain 04/19/2016  . Colostomy in place [facility]) 04/19/2016  . Rectal stenosis 04/04/2015  . Physical exam 06/09/2014  . Right thyroid nodule 03/07/2014  . Slow transit constipation 03/07/2014  .  Personal history of malignant neoplasm of rectum 2014 02/21/2014  . Rectal cancer ([place]) 02/25/2013  . Screening for malignant neoplasm of the cervix 12/25/2012    Current Outpatient Prescriptions on File Prior to Visit  Medication Sig Dispense Refill  . cholecalciferol (VITAMIN D) 1000 units tablet Take 1,000  Units by mouth daily.    Marland Kitchen ibuprofen (ADVIL,MOTRIN) 200 MG tablet Take 400 mg by mouth every 6 (six) hours as needed (Pain). Reported on 02/12/2016    . nicotine (NICODERM CQ - DOSED IN MG/24 HOURS) 21 mg/24hr patch Place 1 patch onto the skin daily.    . ondansetron (ZOFRAN) 8 MG tablet Take 1 tablet (8 mg total) by mouth every 8 (eight) hours as needed for nausea. 20 tablet 3  . traMADol (ULTRAM) 50 MG tablet Take 50 mg by mouth every 6 (six) hours as needed.    . vitamin B-12 (CYANOCOBALAMIN) 500 MCG tablet Take 500 mcg by mouth daily.     No current facility-administered medications on file prior to visit.     Past Medical History:  Diagnosis Date  . Arthritis    LUMBAR  . 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 Complication of anesthesia    "shivers"  . History of kidney stones   . Rectal stenosis   . S/P ileostomy ([place])   . Wears contact lenses     Past Surgical History:  Procedure Laterality Date  . BOWEL RESECTION N/A 08/01/2015   Procedure: SMALL BOWEL RESECTION, takedown of colorectal anastomosis with colostomy;  Surgeon: Leighton Ruff, MD;  Location: [facility];  Service: General;  Laterality: N/A;  . BUNIONECTOMY Left 1984  . COLOSTOMY REVERSAL N/A 11/18/2013   Procedure: LOOP ILEOSTOMY CLOSURE, FLEXIBLE SIGMOIDOSCOPY;  Surgeon: Leighton Ruff, MD;  Location: [facility];  Service: General;  Laterality: N/A;  . EUS N/A 02/25/2013   Procedure: LOWER ENDOSCOPIC ULTRASOUND (EUS);  Surgeon: Milus Banister, MD;  Location: [facility];  Service: Endoscopy;  Laterality: N/A;  radial  . EXAMINATION UNDER ANESTHESIA N/A 07/21/2013   Procedure: EXAM UNDER ANESTHESIA/ RECTAL DILITATION;  Surgeon: Leighton Ruff, MD;  Location: [facility];  Service: General;  Laterality: N/A;  . FLEXIBLE SIGMOIDOSCOPY N/A  07/21/2013   Procedure: FLEXIBLE SIGMOIDOSCOPY;  Surgeon: Leighton Ruff, MD;  Location: [facility];  Service: General;  Laterality: N/A;  . FLEXIBLE SIGMOIDOSCOPY N/A 11/18/2013   Procedure: FLEXIBLE SIGMOIDOSCOPY;  Surgeon: Leighton Ruff, MD;  Location: [facility];  Service: General;  Laterality: N/A;  . FLEXIBLE SIGMOIDOSCOPY N/A 08/01/2015   Procedure: FLEXIBLE SIGMOIDOSCOPY with biopsies;  Surgeon: Leighton Ruff, MD;  Location: [facility];  Service: General;  Laterality: N/A;  . LAPAROSCOPIC LOW ANTERIOR RESECTION N/A 06/14/2013   Procedure: LAPAROSCOPIC LOW ANTERIOR RESECTION AND DIVERTING LAPAROSCOPIC ILEOSTOMY;  Surgeon: Leighton Ruff, MD;  Location: [facility];  Service: General;  Laterality: N/A;  with splenic flexure takdown  . LAPAROSCOPY N/A 08/01/2015   Procedure: LAPAROSCOPY ;  Surgeon: Leighton Ruff, MD;  Location: [facility];  Service: General;  Laterality: N/A;  . [place]    Social History   Social History  . Marital status: Divorced    Spouse name: N/A  . Number of children: 3  . Years of education: N/A   Occupational History  . title closer    Social History  Main Topics  . Smoking status: Current Some Day Smoker    Years: 30.00    Types: Cigarettes  . Smokeless tobacco: Never Used     Comment: 9 cigarettes/ per day. cut back from 2packs per day   . Alcohol use No  . Drug use: No  . Sexual activity: Not Asked   Other Topics Concern  . None   Social History Narrative  . None    Family History  Problem Relation Age of Onset  . Arthritis Mother   . Diabetes Mother   . Thyroid disease Mother   . Arthritis Father   . Diabetes Father   . Parkinson's disease Father   . Colon cancer Neg Hx         Review of Systems  Constitutional: Negative.   HENT: Negative.   Eyes: Negative.   Respiratory: Negative.   Cardiovascular: Negative.   Gastrointestinal: Negative.   Genitourinary: Negative.   Musculoskeletal: Positive for back pain.  Skin:  Negative.   Neurological: Negative.   Endo/Heme/Allergies: Negative.   Psychiatric/Behavioral: Negative for depression, substance abuse and suicidal ideas. The patient is nervous/anxious.        Past occassional anxiety which is managed with self talk and deep breathing. Does not interfere with her daily activities or sleep.    Objective:   Vitals:   04/19/16 1103  BP: 108/62  Pulse: 90  Temp: 97.7 F (36.5 C)    Body mass index is 20.42 kg/m.   Physical Examination:  Physical Exam  Constitutional: She is oriented to person, place, and time.  HENT:  Right Ear: External ear normal.  Left Ear: External ear normal.  Nose: Nose normal.  Mouth/Throat: Uvula is midline and oropharynx is clear and moist. She has dentures. No oropharyngeal exudate.  Eyes: Conjunctivae and EOM are normal. Pupils are equal, round, and reactive to light.  Neck: Normal range of motion. Neck supple.  Right thyroid nodule, nontender, no erythema.  Cardiovascular: Normal rate, normal heart sounds and intact distal pulses.   Pulmonary/Chest: Effort normal and breath sounds normal.  Abdominal: Soft. Bowel sounds are normal. She exhibits no distension. There is no tenderness.  Left lower quadrant colostomy bag. Unable to visualize stoma due to collar of back. No surrounding erythema or induration.  Genitourinary: Cervix normal, right adnexa normal, left adnexa normal and vulva normal. Rectal exam shows no mass. Cervix exhibits no motion tenderness. Vagina exhibits abnormal mucosa. Vagina exhibits no lesion. Thin  fishy  clear and vaginal discharge found.  Genitourinary Comments: Atrophy vaginal mucosa.  Musculoskeletal: Normal range of motion. She exhibits no edema.  Lymphadenopathy:    She has no cervical adenopathy.  Neurological: She is alert and oriented to person, place, and time. No cranial nerve deficit. Gait normal. Coordination normal.  Skin: Skin is warm and dry.  Psychiatric: Affect and judgment  normal.  Vitals reviewed.   ASSESSMENT and PLAN:  Janeiry was seen today for annual exam and gynecologic exam.  Diagnoses and all orders for this visit:  Encounter for preventative adult health care exam with abnormal findings -     Comprehensive metabolic panel; Future -     Lipid Profile; Future -     MM DIGITAL SCREENING BILATERAL; Future -     Cytology - PAP; Future -     HIV antibody (with reflex); Future  Right thyroid nodule -     TSH; Future -     T4, free; Future  Vitamin D insufficiency -  Vitamin D 1,25 dihydroxy; Future  Anemia, unspecified anemia type -     CBC w/Diff; Future  Tobacco use disorder  Breast cancer screening, high risk patient -     MM DIGITAL SCREENING BILATERAL; Future  Need for hepatitis C screening test -     Hepatitis C Ab Reflex HCV RNA, QUANT; Future  Chronic back pain  Colostomy in place [facility])    Right thyroid nodule Palpable nodule on right side. Will check TSH and free T4. 8 mm nodule noted on CT chest July 2015. Ultrasound or biopsy done in the past.  Vitamin D insufficiency Currently taking 1000 Iu. Vitamin D level today.  Tobacco use disorder Wants to continue use of nicotine patch to cut down tobacco use.  Absolute anemia Repeat CBC today. Normal signs of active bleeding.  Chronic back pain Currently managed by orthopedics with use of tramadol.  Colostomy in place [facility]) Secondary to colorectal cancer.      Follow up: Return in about 6 months (around 10/17/2016).  Wilfred Lacy, NP

## 2016-04-19 NOTE — Patient Instructions (Signed)
Health Maintenance, Female Adopting a healthy lifestyle and getting preventive care can go a long way to promote health and wellness. Talk with your health care provider about what schedule of regular examinations is right for you. This is a good chance for you to check in with your provider about disease prevention and staying healthy. In between checkups, there are plenty of things you can do on your own. Experts have done a lot of research about which lifestyle changes and preventive measures are most likely to keep you healthy. Ask your health care provider for more information. WEIGHT AND DIET  Eat a healthy diet  Be sure to include plenty of vegetables, fruits, low-fat dairy products, and lean protein.  Do not eat a lot of foods high in solid fats, added sugars, or salt.  Get regular exercise. This is one of the most important things you can do for your health.  Most adults should exercise for at least 150 minutes each week. The exercise should increase your heart rate and make you sweat (moderate-intensity exercise).  Most adults should also do strengthening exercises at least twice a week. This is in addition to the moderate-intensity exercise.  Maintain a healthy weight  Body mass index (BMI) is a measurement that can be used to identify possible weight problems. It estimates body fat based on height and weight. Your health care provider can help determine your BMI and help you achieve or maintain a healthy weight.  For females 20 years of age and older:   A BMI below 18.5 is considered underweight.  A BMI of 18.5 to 24.9 is normal.  A BMI of 25 to 29.9 is considered overweight.  A BMI of 30 and above is considered obese.  Watch levels of cholesterol and blood lipids  You should start having your blood tested for lipids and cholesterol at 54 years of age, then have this test every 5 years.  You may need to have your cholesterol levels checked more often if:  Your lipid  or cholesterol levels are high.  You are older than 54 years of age.  You are at high risk for heart disease.  CANCER SCREENING   Lung Cancer  Lung cancer screening is recommended for adults 55-80 years old who are at high risk for lung cancer because of a history of smoking.  A yearly low-dose CT scan of the lungs is recommended for people who:  Currently smoke.  Have quit within the past 15 years.  Have at least a 30-pack-year history of smoking. A pack year is smoking an average of one pack of cigarettes a day for 1 year.  Yearly screening should continue until it has been 15 years since you quit.  Yearly screening should stop if you develop a health problem that would prevent you from having lung cancer treatment.  Breast Cancer  Practice breast self-awareness. This means understanding how your breasts normally appear and feel.  It also means doing regular breast self-exams. Let your health care provider know about any changes, no matter how small.  If you are in your 20s or 30s, you should have a clinical breast exam (CBE) by a health care provider every 1-3 years as part of a regular health exam.  If you are 40 or older, have a CBE every year. Also consider having a breast X-ray (mammogram) every year.  If you have a family history of breast cancer, talk to your health care provider about genetic screening.  If you   are at high risk for breast cancer, talk to your health care provider about having an MRI and a mammogram every year.  Breast cancer gene (BRCA) assessment is recommended for women who have family members with BRCA-related cancers. BRCA-related cancers include:  Breast.  Ovarian.  Tubal.  Peritoneal cancers.  Results of the assessment will determine the need for genetic counseling and BRCA1 and BRCA2 testing. Cervical Cancer Your health care provider may recommend that you be screened regularly for cancer of the pelvic organs (ovaries, uterus, and  vagina). This screening involves a pelvic examination, including checking for microscopic changes to the surface of your cervix (Pap test). You may be encouraged to have this screening done every 3 years, beginning at age 21.  For women ages 30-65, health care providers may recommend pelvic exams and Pap testing every 3 years, or they may recommend the Pap and pelvic exam, combined with testing for human papilloma virus (HPV), every 5 years. Some types of HPV increase your risk of cervical cancer. Testing for HPV may also be done on women of any age with unclear Pap test results.  Other health care providers may not recommend any screening for nonpregnant women who are considered low risk for pelvic cancer and who do not have symptoms. Ask your health care provider if a screening pelvic exam is right for you.  If you have had past treatment for cervical cancer or a condition that could lead to cancer, you need Pap tests and screening for cancer for at least 20 years after your treatment. If Pap tests have been discontinued, your risk factors (such as having a new sexual partner) need to be reassessed to determine if screening should resume. Some women have medical problems that increase the chance of getting cervical cancer. In these cases, your health care provider may recommend more frequent screening and Pap tests. Colorectal Cancer  This type of cancer can be detected and often prevented.  Routine colorectal cancer screening usually begins at 54 years of age and continues through 54 years of age.  Your health care provider may recommend screening at an earlier age if you have risk factors for colon cancer.  Your health care provider may also recommend using home test kits to check for hidden blood in the stool.  A small camera at the end of a tube can be used to examine your colon directly (sigmoidoscopy or colonoscopy). This is done to check for the earliest forms of colorectal  cancer.  Routine screening usually begins at age 50.  Direct examination of the colon should be repeated every 5-10 years through 54 years of age. However, you may need to be screened more often if early forms of precancerous polyps or small growths are found. Skin Cancer  Check your skin from head to toe regularly.  Tell your health care provider about any new moles or changes in moles, especially if there is a change in a mole's shape or color.  Also tell your health care provider if you have a mole that is larger than the size of a pencil eraser.  Always use sunscreen. Apply sunscreen liberally and repeatedly throughout the day.  Protect yourself by wearing long sleeves, pants, a wide-brimmed hat, and sunglasses whenever you are outside. HEART DISEASE, DIABETES, AND HIGH BLOOD PRESSURE   High blood pressure causes heart disease and increases the risk of stroke. High blood pressure is more likely to develop in:  People who have blood pressure in the high end   of the normal range (130-139/85-89 mm Hg).  People who are overweight or obese.  People who are African American.  If you are 38-23 years of age, have your blood pressure checked every 3-5 years. If you are 61 years of age or older, have your blood pressure checked every year. You should have your blood pressure measured twice--once when you are at a hospital or clinic, and once when you are not at a hospital or clinic. Record the average of the two measurements. To check your blood pressure when you are not at a hospital or clinic, you can use:  An automated blood pressure machine at a pharmacy.  A home blood pressure monitor.  If you are between 45 years and 39 years old, ask your health care provider if you should take aspirin to prevent strokes.  Have regular diabetes screenings. This involves taking a blood sample to check your fasting blood sugar level.  If you are at a normal weight and have a low risk for diabetes,  have this test once every three years after 54 years of age.  If you are overweight and have a high risk for diabetes, consider being tested at a younger age or more often. PREVENTING INFECTION  Hepatitis B  If you have a higher risk for hepatitis B, you should be screened for this virus. You are considered at high risk for hepatitis B if:  You were born in a country where hepatitis B is common. Ask your health care provider which countries are considered high risk.  Your parents were born in a high-risk country, and you have not been immunized against hepatitis B (hepatitis B vaccine).  You have HIV or AIDS.  You use needles to inject street drugs.  You live with someone who has hepatitis B.  You have had sex with someone who has hepatitis B.  You get hemodialysis treatment.  You take certain medicines for conditions, including cancer, organ transplantation, and autoimmune conditions. Hepatitis C  Blood testing is recommended for:  Everyone born from [birth year] through 1964.  Anyone with known risk factors for hepatitis C. Sexually transmitted infections (STIs)  You should be screened for sexually transmitted infections (STIs) including gonorrhea and chlamydia if:  You are sexually active and are younger than 54 years of age.  You are older than 53 years of age and your health care provider tells you that you are at risk for this type of infection.  Your sexual activity has changed since you were last screened and you are at an increased risk for chlamydia or gonorrhea. Ask your health care provider if you are at risk.  If you do not have HIV, but are at risk, it may be recommended that you take a prescription medicine daily to prevent HIV infection. This is called pre-exposure prophylaxis (PrEP). You are considered at risk if:  You are sexually active and do not regularly use condoms or know the HIV status of your partner(s).  You take drugs by injection.  You are sexually  active with a partner who has HIV. Talk with your health care provider about whether you are at high risk of being infected with HIV. If you choose to begin PrEP, you should first be tested for HIV. You should then be tested every 3 months for as long as you are taking PrEP.  PREGNANCY   If you are premenopausal and you may become pregnant, ask your health care provider about preconception counseling.  If you may  become pregnant, take 400 to 800 micrograms (mcg) of folic acid every day.  If you want to prevent pregnancy, talk to your health care provider about birth control (contraception). OSTEOPOROSIS AND MENOPAUSE   Osteoporosis is a disease in which the bones lose minerals and strength with aging. This can result in serious bone fractures. Your risk for osteoporosis can be identified using a bone density scan.  If you are 61 years of age or older, or if you are at risk for osteoporosis and fractures, ask your health care provider if you should be screened.  Ask your health care provider whether you should take a calcium or vitamin D supplement to lower your risk for osteoporosis.  Menopause may have certain physical symptoms and risks.  Hormone replacement therapy may reduce some of these symptoms and risks. Talk to your health care provider about whether hormone replacement therapy is right for you.  HOME CARE INSTRUCTIONS   Schedule regular health, dental, and eye exams.  Stay current with your immunizations.   Do not use any tobacco products including cigarettes, chewing tobacco, or electronic cigarettes.  If you are pregnant, do not drink alcohol.  If you are breastfeeding, limit how much and how often you drink alcohol.  Limit alcohol intake to no more than 1 drink per day for nonpregnant women. One drink equals 12 ounces of beer, 5 ounces of wine, or 1 ounces of hard liquor.  Do not use street drugs.  Do not share needles.  Ask your health care provider for help if  you need support or information about quitting drugs.  Tell your health care provider if you often feel depressed.  Tell your health care provider if you have ever been abused or do not feel safe at home.   This information is not intended to replace advice given to you by your health care provider. Make sure you discuss any questions you have with your health care provider.   Document Released: 02/18/2011 Document Revised: 08/26/2014 Document Reviewed: 07/07/2013 Elsevier Interactive Patient Education [employer].

## 2016-04-19 NOTE — Assessment & Plan Note (Signed)
Repeat CBC today. Normal signs of active bleeding.

## 2016-04-22 LAB — VITAMIN D 1,25 DIHYDROXY
VITAMIN D3 1, 25 (OH): 34 pg/mL
Vitamin D 1, 25 (OH)2 Total: 34 pg/mL (ref 18–72)
Vitamin D2 1, 25 (OH)2: <8 pg/mL

## 2016-04-23 ENCOUNTER — Ambulatory Visit (INDEPENDENT_AMBULATORY_CARE_PROVIDER_SITE_OTHER): Payer: BLUE CROSS/BLUE SHIELD

## 2016-04-23 DIAGNOSIS — Z23 Encounter for immunization: Secondary | ICD-10-CM

## 2016-04-30 LAB — CYTOLOGY - PAP

## 2016-04-30 NOTE — Progress Notes (Signed)
Normal results, see office note

## 2016-07-09 ENCOUNTER — Ambulatory Visit: Payer: BLUE CROSS/BLUE SHIELD

## 2016-08-08 ENCOUNTER — Ambulatory Visit (HOSPITAL_BASED_OUTPATIENT_CLINIC_OR_DEPARTMENT_OTHER): Payer: BLUE CROSS/BLUE SHIELD | Admitting: Nurse Practitioner

## 2016-08-08 ENCOUNTER — Other Ambulatory Visit (HOSPITAL_BASED_OUTPATIENT_CLINIC_OR_DEPARTMENT_OTHER): Payer: BLUE CROSS/BLUE SHIELD

## 2016-08-08 ENCOUNTER — Telehealth: Payer: Self-pay

## 2016-08-08 ENCOUNTER — Telehealth: Payer: Self-pay | Admitting: Nurse Practitioner

## 2016-08-08 DIAGNOSIS — C20 Malignant neoplasm of rectum: Secondary | ICD-10-CM

## 2016-08-08 DIAGNOSIS — Z720 Tobacco use: Secondary | ICD-10-CM

## 2016-08-08 LAB — CEA (IN HOUSE-CHCC): CEA (CHCC-In House): 2.9 ng/mL (ref 0.00–5.00)

## 2016-08-08 MED ORDER — ONDANSETRON HCL 8 MG PO TABS: 8.0000 mg | ORAL_TABLET | Freq: Three times a day (TID) | ORAL | 3 refills | Status: DC | PRN

## 2016-08-08 NOTE — Telephone Encounter (Signed)
Appointments scheduled per 12/21 LOS. Patient given AVS report and calendars with future scheduled appointments.  °

## 2016-08-08 NOTE — Telephone Encounter (Signed)
Called Dr. Celesta Aver office to see when patient is due for colonoscopy. Pt due in July 2018, there office will contact pt closer to that time to set up an appt. Pt made aware.

## 2016-08-08 NOTE — Progress Notes (Signed)
  [place] OFFICE PROGRESS NOTE   Diagnosis:  Rectal cancer  INTERVAL HISTORY:   Angela Schmidt returns as scheduled. She feels well. Colostomy is functioning normally. No abdominal pain. She continues to have mild intermittent nausea. She takes Zofran if needed. She continues to smoke. She has decreased the number of cigarettes per day to 6 or 7.  Objective:  Vital signs in last 24 hours:  Blood pressure 115/76, pulse 90, temperature 98.1 F (36.7 C), temperature source Oral, resp. rate 18, height 5\' 3"  (1.6 m), weight 116 lb 14.4 oz (53 kg), SpO2 98 %.    HEENT: No neck mass. No thrush or ulcers. Lymphatics: No palpable cervical, supra clavicular, axillary or inguinal lymph nodes. Resp: Lungs clear bilaterally. Cardio: Regular rate and rhythm. GI: Abdomen soft and nontender. No hepatomegaly. No mass. Left lower quadrant colostomy. Vascular: No leg edema.   Lab Results:  Lab Results  Component Value Date   WBC 7.1 04/19/2016   HGB 14.9 04/19/2016   HCT 43.3 04/19/2016   MCV 88.4 04/19/2016   PLT 312.0 04/19/2016   NEUTROABS 4.8 04/19/2016    Imaging:  No results found.  Medications: I have reviewed the patient's current medications.  Assessment/Plan: 1. Rectal cancer, clinical stage III (uT3 uN1) measured at 7 cm from the anal verge.  No evidence for metastatic disease on CTs of the chest, abdomen and pelvis 02/24/2013.   Elevated CEA 02/22/2013 (13.6). Repeat CEA 08/26/2013 normal (1.1).   Initiation of neoadjuvant Xeloda and radiation on 03/22/2013, completed 04/29/2013.   Status post a low anterior resection and diverting ileostomy on 06/14/2013-ypT2,ypN0.   Cycle 1 adjuvant Xeloda beginning 07/16/2013.   Cycle 2 adjuvant Xeloda beginning 08/06/2013.   Cycle 3 adjuvant Xeloda beginning 08/27/2013.   Cycle 4 adjuvant Xeloda beginning 09/17/2013.   Cycle 5 adjuvant Xeloda beginning 10/08/2013.  Colonoscopy 02/21/2014. Evidence of  prior surgical anastomosis in the anal canal, moderate stenosis and a small opening in the mucosa of unclear significance if any. No signs of cancer/polyps. Colon mucosa otherwise normal. Repeat colonoscopy recommended in 3 years.  02/23/2014 CEA 1.5.  CT chest/abdomen/pelvis 02/23/2014. Postsurgical changes related to the low anterior resection, diverting ileostomy and subsequent takedown. Radiation changes within the pelvis without definite residual disease. No distant metastases identified.  CT abdomen/pelvis 07/29/2015-inflammatory change involving the rectum with extraluminal air  CT of the chest 07/31/2015-no adenopathy, no nodules 2. "Rheumatoid" arthritis. 3. Tobacco use. 4. Trachea/bronchial nodularity noted on the chest CT 02/24/2013. 5. History of hand-foot syndrome with dryness over the palms. 6. Ileostomy reversal 11/18/2013. 7. 8 mm low-density lesion right thyroid identified on chest CT 02/23/2014. 8. Coloanal anastomosis disruption December 2016-takedown of coloanal anastomosis, colostomy creation 08/01/2015, pathology negative for malignancy   Disposition: [name] appears well. She remains in clinical remission from rectal cancer. We will follow-up on the CEA from today. Her last colonoscopy was 02/21/2014. We will contact Dr. Celesta Aver office to determine the timing of the next colonoscopy.  We discussed smoking cessation.  She will return for a follow-up visit and CEA in 6 months.    Ned Card ANP/GNP-BC   08/08/2016  9:05 AM

## 2016-08-09 LAB — CEA (PARALLEL TESTING): CEA: 1 ng/mL

## 2016-08-13 ENCOUNTER — Telehealth: Payer: Self-pay

## 2016-08-13 ENCOUNTER — Telehealth: Payer: Self-pay | Admitting: *Deleted

## 2016-08-13 NOTE — Telephone Encounter (Signed)
Call received from patient calling back for lab results.  Patient informed per Dr. Benay Spice, that cea is normal.  Patient has no questions or concerns at this time.

## 2016-08-13 NOTE — Telephone Encounter (Signed)
-----   Message from Ladell Pier, MD sent at 08/09/2016  8:48 PM EST ----- Please call patient, Angela Schmidt is normal

## 2016-08-13 NOTE — Telephone Encounter (Signed)
-----   Message from Ladell Pier, MD sent at 08/09/2016  8:48 PM EST ----- Please call patient, cea is normal

## 2016-08-13 NOTE — Telephone Encounter (Signed)
Left message for pt to call back regarding lab results. 

## 2016-10-17 ENCOUNTER — Ambulatory Visit: Payer: BLUE CROSS/BLUE SHIELD | Admitting: Nurse Practitioner

## 2016-10-22 ENCOUNTER — Ambulatory Visit: Payer: BLUE CROSS/BLUE SHIELD | Admitting: Nurse Practitioner

## 2016-12-05 ENCOUNTER — Ambulatory Visit (INDEPENDENT_AMBULATORY_CARE_PROVIDER_SITE_OTHER): Payer: BLUE CROSS/BLUE SHIELD | Admitting: Family Medicine

## 2016-12-05 VITALS — BP 132/88 | HR 88 | Temp 97.7°F | Ht 63.0 in | Wt 117.8 lb

## 2016-12-05 DIAGNOSIS — M19041 Primary osteoarthritis, right hand: Secondary | ICD-10-CM | POA: Diagnosis not present

## 2016-12-05 DIAGNOSIS — Z1231 Encounter for screening mammogram for malignant neoplasm of breast: Secondary | ICD-10-CM | POA: Diagnosis not present

## 2016-12-05 DIAGNOSIS — Z1239 Encounter for other screening for malignant neoplasm of breast: Secondary | ICD-10-CM

## 2016-12-05 DIAGNOSIS — Z23 Encounter for immunization: Secondary | ICD-10-CM

## 2016-12-05 DIAGNOSIS — C20 Malignant neoplasm of rectum: Secondary | ICD-10-CM

## 2016-12-05 DIAGNOSIS — M19042 Primary osteoarthritis, left hand: Secondary | ICD-10-CM

## 2016-12-05 MED ORDER — TRAMADOL HCL 50 MG PO TABS: 50.0000 mg | ORAL_TABLET | Freq: Four times a day (QID) | ORAL | 2 refills | Status: DC | PRN

## 2016-12-05 NOTE — Progress Notes (Signed)
[place] at [facility] [address] [place], [place] [address] [phone]  Date:  12/05/2016   Name:  Angela Schmidt   DOB:  08/30/1960   MRN:  [id number]  PCP:  Lamar Blinks, MD    Chief Complaint: Establish Care (Former pt of Dr. Birdie Riddle. Pt here to get est. )   History of Present Illness:  Salvatrice Morandi is a 56 y.o. very pleasant female patient who presents with the following:  Here today as a new patient to establish care- former pt of Dr. Birdie Riddle but has not seen here since 2016. She did see antoher doctor in between [place] but prefers to come out to [place] History of rectal cancer- she had her colonoscopy in 2014 and that is when they found her cancer.   She is done with chemo and radiation- she sees oncology every 6 months.  For the time being they are monitoring her with CEA levels.   Last colon was in 2016.  She has a colostomy bag- this is working well for her  s/p BTL Her GM and her aunt both had thyroid disease, as does her mom.   We do her paps for her- last was done in the fall, normal and negative HPV She is a bit overdue for her mammogram- she would like for [place] to scheudle this for her She is due for a Td booster  She has noted foot pain for about one year- it will be present when she first gets up in the morning.  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 She uses tramadol prn for this- has been getting from her rheum Dr. Pearla Dubonnet wonders if we could rx this for her. She takes this once a day during the week and BID on the weekend when she is more active Tobacco- She has cut down from 2 PPD to 1/2 PPD- she is continuing to try to cut down  She works in [employer], has 3 children- 2 in college and 1 is 60 yo No grands as of yet  NCCSR:  Fills of 60 tramadol from Dr. Dossie Der- nothing else noted, nothing unexpected  Patient Active Problem List   Diagnosis Date Noted  . Vitamin D insufficiency  04/19/2016  . Tobacco use disorder 04/19/2016  . Absolute anemia 04/19/2016  . Breast cancer screening, high risk patient 04/19/2016  . Need for hepatitis C screening test 04/19/2016  . Chronic back pain 04/19/2016  . Colostomy in place [facility]) 04/19/2016  . Rectal stenosis 04/04/2015  . Physical exam 06/09/2014  . Right thyroid nodule 03/07/2014  . Slow transit constipation 03/07/2014  . Personal history of malignant neoplasm of rectum 2014 02/21/2014  . Rectal cancer ([place]) 02/25/2013  . Screening for malignant neoplasm of the cervix 12/25/2012    Past Medical History:  Diagnosis Date  . Arthritis    LUMBAR  . Cancer of rectosigmoid (colon) ([place]) stage III (uT3  uN1)  radiation complete 04-29-2013  currently on oral chemo med---  oncologist-- dr Benay Spice   06-14-2013  s/p anterior resection w/ Diverting ileostomy---  . Complication of anesthesia    "shivers"  . History of kidney stones   . Rectal stenosis   . S/P ileostomy ([place])   . Wears contact lenses     Past Surgical History:  Procedure Laterality Date  . BOWEL RESECTION N/A 08/01/2015   Procedure: SMALL BOWEL RESECTION, takedown of colorectal anastomosis with  colostomy;  Surgeon: Leighton Ruff, MD;  Location: [facility];  Service: General;  Laterality: N/A;  . BUNIONECTOMY Left 1984  . COLOSTOMY REVERSAL N/A 11/18/2013   Procedure: LOOP ILEOSTOMY CLOSURE, FLEXIBLE SIGMOIDOSCOPY;  Surgeon: Leighton Ruff, MD;  Location: [facility];  Service: General;  Laterality: N/A;  . EUS N/A 02/25/2013   Procedure: LOWER ENDOSCOPIC ULTRASOUND (EUS);  Surgeon: Milus Banister, MD;  Location: [facility];  Service: Endoscopy;  Laterality: N/A;  radial  . EXAMINATION UNDER ANESTHESIA N/A 07/21/2013   Procedure: EXAM UNDER ANESTHESIA/ RECTAL DILITATION;  Surgeon: Leighton Ruff, MD;  Location: [facility];  Service: General;  Laterality: N/A;  . FLEXIBLE SIGMOIDOSCOPY N/A 07/21/2013   Procedure: FLEXIBLE SIGMOIDOSCOPY;  Surgeon: Leighton Ruff, MD;  Location: [facility];  Service: General;  Laterality: N/A;  . FLEXIBLE SIGMOIDOSCOPY N/A 11/18/2013   Procedure: FLEXIBLE SIGMOIDOSCOPY;  Surgeon: Leighton Ruff, MD;  Location: [facility];  Service: General;  Laterality: N/A;  . FLEXIBLE SIGMOIDOSCOPY N/A 08/01/2015   Procedure: FLEXIBLE SIGMOIDOSCOPY with biopsies;  Surgeon: Leighton Ruff, MD;  Location: [facility];  Service: General;  Laterality: N/A;  . LAPAROSCOPIC LOW ANTERIOR RESECTION N/A 06/14/2013   Procedure: LAPAROSCOPIC LOW ANTERIOR RESECTION AND DIVERTING LAPAROSCOPIC ILEOSTOMY;  Surgeon: Leighton Ruff, MD;  Location: [facility];  Service: General;  Laterality: N/A;  with splenic flexure takdown  . LAPAROSCOPY N/A 08/01/2015   Procedure: LAPAROSCOPY ;  Surgeon: Leighton Ruff, MD;  Location: [facility];  Service: General;  Laterality: N/A;  . [place]    Social History  Substance Use Topics  . Smoking status: Current Some Day Smoker    Years: 30.00    Types: Cigarettes  . Smokeless tobacco: Never Used     Comment: 9 cigarettes/ per day. cut back from 2packs per day   . Alcohol use No    Family History  Problem Relation Age of Onset  . Arthritis Mother   . Diabetes Mother   . Thyroid disease Mother   . Arthritis Father   . Diabetes Father   . Parkinson's disease Father   . Colon cancer Neg Hx     Allergies  Allergen Reactions  . Demerol [Meperidine] Nausea Only    Medication list has been reviewed and updated.  Current Outpatient Prescriptions on File Prior to Visit  Medication Sig Dispense Refill  . cholecalciferol (VITAMIN D) 1000 units tablet Take 1,000 Units by mouth daily.    Marland Kitchen ibuprofen (ADVIL,MOTRIN) 200 MG tablet Take 400 mg by mouth every 6 (six) hours as needed (Pain). Reported on 02/12/2016    . Melatonin 5 MG TABS Take by mouth.    . nicotine (NICODERM CQ - DOSED IN MG/24 HOURS) 21 mg/24hr patch Place 1 patch onto the skin daily.    . ondansetron (ZOFRAN) 8 MG tablet Take 1  tablet (8 mg total) by mouth every 8 (eight) hours as needed for nausea. 20 tablet 3  . vitamin B-12 (CYANOCOBALAMIN) 500 MCG tablet Take 500 mcg by mouth daily.     No current facility-administered medications on file prior to visit.     Review of Systems:  As per HPI- otherwise negative.   Physical Examination: Vitals:   12/05/16 1306  BP: 132/88  Pulse: 88  Temp: 97.7 F (36.5 C)   Vitals:   12/05/16 1306  Weight: 117 lb 12.8 oz (53.4 kg)  Height: 5\' 3"  (1.6 m)   Body mass index is 20.87 kg/m. Ideal Body Weight: Weight  in (lb) to have BMI = 25: 140.8  GEN: WDWN, NAD, Non-toxic, A & O x 3, slim build, looks well HEENT: Atraumatic, Normocephalic. Neck supple. No masses, No LAD.  Bilateral TM wnl, oropharynx normal.  PEERL,EOMI.   Ears and Nose: No external deformity. CV: RRR, No M/G/R. No JVD. No thrill. No extra heart sounds. PULM: CTA B, no wheezes, crackles, rhonchi. No retractions. No resp. distress. No accessory muscle use. ABD: S, NT, ND, +BS. No rebound. No HSM.  Colostomy bag on left lower belly EXTR: No c/c/e NEURO Normal gait.  PSYCH: Normally interactive. Conversant. Not depressed or anxious appearing.  Calm demeanor.    Assessment and Plan: Rectal cancer ([place])  Screening for breast cancer - Plan: MM SCREENING BREAST TOMO BILATERAL  Immunization due - Plan: Td vaccine greater than or equal to 7yo preservative free IM  Primary osteoarthritis of both hands - Plan: traMADol (ULTRAM) 50 MG tablet  Here today to establish care with me Her main issue is her history of rectal cancer- currently in remission Refilled her tramadol that she uses prn for her hand/ other joint pains  Signed Lamar Blinks, MD

## 2016-12-05 NOTE — Patient Instructions (Addendum)
It was good to see you today-  Let's plan to see you in the fall to do your labs/ physical  We will arrange for your mammogram and give you a tetanus shot today- this is good for 10 years.    Let me know if any problems before we meet in the fall

## 2016-12-13 ENCOUNTER — Encounter (HOSPITAL_BASED_OUTPATIENT_CLINIC_OR_DEPARTMENT_OTHER): Payer: Self-pay

## 2016-12-13 ENCOUNTER — Ambulatory Visit (HOSPITAL_BASED_OUTPATIENT_CLINIC_OR_DEPARTMENT_OTHER)
Admission: RE | Admit: 2016-12-13 | Discharge: 2016-12-13 | Disposition: A | Discharge status: Home / Self Care | Payer: BLUE CROSS/BLUE SHIELD | Source: Ambulatory Visit | Attending: Family Medicine | Admitting: Family Medicine

## 2016-12-13 DIAGNOSIS — Z1231 Encounter for screening mammogram for malignant neoplasm of breast: Secondary | ICD-10-CM | POA: Diagnosis present

## 2016-12-13 DIAGNOSIS — Z1239 Encounter for other screening for malignant neoplasm of breast: Secondary | ICD-10-CM

## 2017-02-06 ENCOUNTER — Telehealth: Payer: Self-pay | Admitting: Oncology

## 2017-02-06 ENCOUNTER — Ambulatory Visit (HOSPITAL_BASED_OUTPATIENT_CLINIC_OR_DEPARTMENT_OTHER): Payer: BLUE CROSS/BLUE SHIELD | Admitting: Oncology

## 2017-02-06 ENCOUNTER — Other Ambulatory Visit (HOSPITAL_BASED_OUTPATIENT_CLINIC_OR_DEPARTMENT_OTHER): Payer: BLUE CROSS/BLUE SHIELD

## 2017-02-06 VITALS — BP 123/78 | HR 69 | Temp 98.2°F | Resp 18 | Ht 63.0 in | Wt 117.4 lb

## 2017-02-06 DIAGNOSIS — C20 Malignant neoplasm of rectum: Secondary | ICD-10-CM

## 2017-02-06 DIAGNOSIS — Z720 Tobacco use: Secondary | ICD-10-CM

## 2017-02-06 DIAGNOSIS — R200 Anesthesia of skin: Secondary | ICD-10-CM

## 2017-02-06 LAB — CEA (IN HOUSE-CHCC): CEA (CHCC-IN HOUSE): 3 ng/mL (ref 0.00–5.00)

## 2017-02-06 NOTE — Progress Notes (Signed)
  [place] OFFICE PROGRESS NOTE   Diagnosis: Rectal cancer  INTERVAL HISTORY:   Angela Schmidt returns as scheduled. She feels well. She is attempting to quit smoking. The colostomy is functioning well. She has noted discomfort at the lateral plantar aspect of both feet for the past few months. She wonders whether the neuropathy symptoms are returning. No peripheral foot or hand numbness. No weakness.  Objective:  Vital signs in last 24 hours:  Blood pressure 123/78, pulse 69, temperature 98.2 F (36.8 C), temperature source Oral, resp. rate 18, height 5\' 3"  (1.6 m), weight 117 lb 6.4 oz (53.3 kg), SpO2 100 %.    HEENT: Neck without mass Lymphatics: No cervical, supraclavicular, axillary, or inguinal nodes Resp: Lungs clear bilaterally Cardio: Regular rate and rhythm GI: No hepatosplenomegaly, no mass, left lower quadrant colostomy Vascular: No leg edema Neuro: The leg and foot strength is intact bilaterally, sensation intact to light touch at the soles      Lab Results:   Lab Results  Component Value Date   CEA1 2.90 08/08/2016     Medications: I have reviewed the patient's current medications.  Assessment/Plan: 1. Rectal cancer, clinical stage III (uT3 uN1) measured at 7 cm from the anal verge.  No evidence for metastatic disease on CTs of the chest, abdomen and pelvis 02/24/2013.   Elevated CEA 02/22/2013 (13.6). Repeat CEA 08/26/2013 normal (1.1).   Initiation of neoadjuvant Xeloda and radiation on 03/22/2013, completed 04/29/2013.   Status post a low anterior resection and diverting ileostomy on 06/14/2013-ypT2,ypN0.   Cycle 1 adjuvant Xeloda beginning 07/16/2013.   Cycle 2 adjuvant Xeloda beginning 08/06/2013.   Cycle 3 adjuvant Xeloda beginning 08/27/2013.   Cycle 4 adjuvant Xeloda beginning 09/17/2013.   Cycle 5 adjuvant Xeloda beginning 10/08/2013.  Colonoscopy 02/21/2014. Evidence of prior surgical anastomosis in the anal  canal, moderate stenosis and a small opening in the mucosa of unclear significance if any. No signs of cancer/polyps. Colon mucosa otherwise normal. Repeat colonoscopy recommended in 3 years.  02/23/2014 CEA 1.5.  CT chest/abdomen/pelvis 02/23/2014. Postsurgical changes related to the low anterior resection, diverting ileostomy and subsequent takedown. Radiation changes within the pelvis without definite residual disease. No distant metastases identified.  CT abdomen/pelvis 07/29/2015-inflammatory change involving the rectum with extraluminal air  CT of the chest 07/31/2015-no adenopathy, no nodules 2. "Rheumatoid" arthritis. 3. Tobacco use. 4. Trachea/bronchial nodularity noted on the chest CT 02/24/2013. 5. History of hand-foot syndrome with dryness over the palms. 6. Ileostomy reversal 11/18/2013. 7. 8 mm low-density lesion right thyroid identified on chest CT 02/23/2014. 8. Coloanal anastomosis disruption December 2016-takedown of coloanal anastomosis, colostomy creation 08/01/2015, pathology negative for malignancy  Disposition:  Angela Schmidt remains in clinical remission from rectal cancer. We will follow-up on the CEA from today. She will return for an office visit and CEA in 6 months. We will refer her to Dr. Carlean Purl for a surveillance colonoscopy.  I doubt the foot numbness and discomfort is related to adjuvant therapy or the diagnosis of rectal cancer.  15 minutes were spent with the patient today. The majority of the time was used for counseling and coordination of care.  Donneta Romberg, MD  02/06/2017  12:03 PM

## 2017-02-06 NOTE — Telephone Encounter (Signed)
Appointments scheduled per 02/06/17 los. Patient was given a copy of the AVS report and appointment schedule per 02/06/17 los.

## 2017-02-07 LAB — CEA (PARALLEL TESTING): CEA: 1.1 ng/mL

## 2017-02-16 NOTE — Progress Notes (Signed)
Please set up previsit and colonoscopy

## 2017-02-17 ENCOUNTER — Telehealth: Payer: Self-pay | Admitting: Internal Medicine

## 2017-02-17 NOTE — Telephone Encounter (Signed)
Will try to call patient again later to schedule her Colonoscopy.

## 2017-02-21 ENCOUNTER — Telehealth: Payer: Self-pay | Admitting: Internal Medicine

## 2017-02-21 NOTE — Telephone Encounter (Signed)
Patient is wondering if the recall is necessary, as she had a colonoscopy in 2016.  She is asking why only 2 years as opposed to 3 or 5 years.

## 2017-02-23 NOTE — Telephone Encounter (Signed)
She had an incomplete colonoscopy to sigmoid - stricture and poor pre so last colonosopy that counts was 2015 and protocol is to repeat 3 years after last and this is this year

## 2017-02-24 ENCOUNTER — Encounter: Payer: Self-pay | Admitting: Internal Medicine

## 2017-02-24 NOTE — Telephone Encounter (Signed)
Left message for patient to call back  

## 2017-02-25 NOTE — Telephone Encounter (Signed)
LM for pt to call back. Also mailed a Recall Letter.

## 2017-02-25 NOTE — Telephone Encounter (Signed)
Patient notified of the recommendations She is scheduled for a pre-visit tomorrow and a colon on 7/16

## 2017-02-26 ENCOUNTER — Ambulatory Visit (AMBULATORY_SURGERY_CENTER): Payer: Self-pay

## 2017-02-26 VITALS — Ht 64.0 in | Wt 117.6 lb

## 2017-02-26 DIAGNOSIS — Z85048 Personal history of other malignant neoplasm of rectum, rectosigmoid junction, and anus: Secondary | ICD-10-CM

## 2017-02-26 NOTE — Progress Notes (Signed)
Denies allergies to eggs or soy products. Denies complication of anesthesia or sedation. Denies use of weight loss medication. Denies use of O2.   Emmi instructions declined.  

## 2017-02-27 ENCOUNTER — Encounter: Payer: Self-pay | Admitting: Internal Medicine

## 2017-03-03 ENCOUNTER — Ambulatory Visit (AMBULATORY_SURGERY_CENTER): Payer: BLUE CROSS/BLUE SHIELD | Admitting: Internal Medicine

## 2017-03-03 ENCOUNTER — Encounter: Payer: Self-pay | Admitting: Internal Medicine

## 2017-03-03 VITALS — BP 92/71 | HR 62 | Temp 98.4°F | Resp 14 | Ht 64.0 in | Wt 117.0 lb

## 2017-03-03 DIAGNOSIS — Z85048 Personal history of other malignant neoplasm of rectum, rectosigmoid junction, and anus: Secondary | ICD-10-CM

## 2017-03-03 DIAGNOSIS — Z1211 Encounter for screening for malignant neoplasm of colon: Secondary | ICD-10-CM

## 2017-03-03 MED ORDER — SODIUM CHLORIDE 0.9 % IV SOLN
500.0000 mL | INTRAVENOUS | Status: DC
Start: 2017-03-03 — End: 2017-06-04

## 2017-03-03 NOTE — Progress Notes (Signed)
Kristen Moore PA-S present for procedure 

## 2017-03-03 NOTE — Op Note (Signed)
[facility] Patient Name: Angela Schmidt Procedure Date: 03/03/2017 10:54 AM MRN: [id number] Endoscopist: Gatha Mayer , MD Age: 56 Referring MD:  Date of Birth: July 25, 1961 Gender: Female Account #: [id number] Procedure:                Colonoscopy Indications:              High risk colon cancer surveillance: Personal                            history of rectal cancer Medicines:                Propofol per Anesthesia, Monitored Anesthesia Care Procedure:                Pre-Anesthesia Assessment:                           - Prior to the procedure, a History and Physical                            was performed, and patient medications and                            allergies were reviewed. The patient's tolerance of                            previous anesthesia was also reviewed. The risks                            and benefits of the procedure and the sedation                            options and risks were discussed with the patient.                            All questions were answered, and informed consent                            was obtained. Prior Anticoagulants: The patient has                            taken no previous anticoagulant or antiplatelet                            agents. ASA Grade Assessment: II - A patient with                            mild systemic disease. After reviewing the risks                            and benefits, the patient was deemed in                            satisfactory condition to undergo the procedure.  After obtaining informed consent, the colonoscope                            was passed under direct vision. Throughout the                            procedure, the patient's blood pressure, pulse, and                            oxygen saturations were monitored continuously. The                            Colonoscope was introduced through the sigmoid                            colostomy and  advanced to the the cecum, identified                            by appendiceal orifice and ileocecal valve. The                            colonoscopy was performed without difficulty. The                            patient tolerated the procedure well. The quality                            of the bowel preparation was excellent. The                            terminal ileum, the ileocecal valve and the                            appendiceal orifice were photographed. The bowel                            preparation used was Miralax. Scope In: 11:04:34 AM Scope Out: 11:12:59 AM Scope Withdrawal Time: 0 hours 6 minutes 6 seconds  Total Procedure Duration: 0 hours 8 minutes 25 seconds  Findings:                 The digital rectal exam findings include rectal                            stricture.                           The entire examined colon appeared normal. Complications:            No immediate complications. Estimated Blood Loss:     Estimated blood loss: none. Impression:               - Rectal stricture found on digital rectal exam.                           - The entire examined  colon is normal.                           - No specimens collected. Recommendation:           - Patient has a contact number available for                            emergencies. The signs and symptoms of potential                            delayed complications were discussed with the                            patient. Return to normal activities tomorrow.                            Written discharge instructions were provided to the                            patient.                           - Resume previous diet.                           - Continue present medications.                           - Repeat colonoscopy in 5 years for surveillance. Gatha Mayer, MD 03/03/2017 11:19:19 AM This report has been signed electronically.

## 2017-03-03 NOTE — Progress Notes (Signed)
Pt's states no medical or surgical changes since previsit or office visit.  No egg or soy allergy   Pt has small veins- used 24 cath to Iv in right hand - e Luiza Carranco RN

## 2017-03-03 NOTE — Patient Instructions (Addendum)
   No polyps, no cancer. Stricture (scar) remains in the rectum.  Your next routine colonoscopy should be in 5 years - 2023.  I appreciate the opportunity to care for you. Gatha Mayer, MD, FACG YOU HAD AN ENDOSCOPIC PROCEDURE TODAY AT [facility]:   Refer to the procedure report that was given to you for any specific questions about what was found during the examination.  If the procedure report does not answer your questions, please call your gastroenterologist to clarify.  If you requested that your care partner not be given the details of your procedure findings, then the procedure report has been included in a sealed envelope for you to review at your convenience later.  YOU SHOULD EXPECT: Some feelings of bloating in the abdomen. Passage of more gas than usual.  Walking can help get rid of the air that was put into your GI tract during the procedure and reduce the bloating. If you had a lower endoscopy (such as a colonoscopy or flexible sigmoidoscopy) you may notice spotting of blood in your stool or on the toilet paper. If you underwent a bowel prep for your procedure, you may not have a normal bowel movement for a few days.  Please Note:  You might notice some irritation and congestion in your nose or some drainage.  This is from the oxygen used during your procedure.  There is no need for concern and it should clear up in a day or so.  SYMPTOMS TO REPORT IMMEDIATELY:   Following lower endoscopy (colonoscopy or flexible sigmoidoscopy):  Excessive amounts of blood in the stool  Significant tenderness or worsening of abdominal pains  Swelling of the abdomen that is new, acute  Fever of 100F or higher  For urgent or emergent issues, a gastroenterologist can be reached at any hour by calling [phone].   DIET:  We do recommend a small meal at first, but then you may proceed to your regular diet.  Drink plenty of fluids but you should avoid alcoholic  beverages for 24 hours.  MEDICATIONS:  Continue present medications.  ACTIVITY:  You should plan to take it easy for the rest of today and you should NOT DRIVE or use heavy machinery until tomorrow (because of the sedation medicines used during the test).    FOLLOW UP: Our staff will call the number listed on your records the next business day following your procedure to check on you and address any questions or concerns that you may have regarding the information given to you following your procedure. If we do not reach you, we will leave a message.  However, if you are feeling well and you are not experiencing any problems, there is no need to return our call.  We will assume that you have returned to your regular daily activities without incident.  If any biopsies were taken you will be contacted by phone or by letter within the next 1-3 weeks.  Please call us at [phone] if you have not heard about the biopsies in 3 weeks.   Thank you for allowing [place] to provide for your healthcare needs today.   SIGNATURES/CONFIDENTIALITY: You and/or your care partner have signed paperwork which will be entered into your electronic medical record.  These signatures attest to the fact that that the information above on your After Visit Summary has been reviewed and is understood.  Full responsibility of the confidentiality of this discharge information lies with you and/or your care-partner.

## 2017-03-03 NOTE — Progress Notes (Signed)
A and O x3. Report to RN. Tolerated MAC anesthesia well.

## 2017-03-04 ENCOUNTER — Telehealth: Payer: Self-pay | Admitting: *Deleted

## 2017-03-04 NOTE — Telephone Encounter (Signed)
  Follow up Call-  Call back number 03/03/2017 03/03/2015  Post procedure Call Back phone  # [phone]  Permission to leave phone message Yes Yes  Some recent data might be hidden     Patient questions:  Do you have a fever, pain , or abdominal swelling? No. Pain Score  0 *  Have you tolerated food without any problems? Yes.    Have you been able to return to your normal activities? Yes.    Do you have any questions about your discharge instructions: Diet   No. Medications  No. Follow up visit  No.  Do you have questions or concerns about your Care? No.  Actions: * If pain score is 4 or above: No action needed, pain <4.

## 2017-04-30 ENCOUNTER — Other Ambulatory Visit: Payer: Self-pay | Admitting: Family Medicine

## 2017-04-30 ENCOUNTER — Other Ambulatory Visit: Payer: Self-pay | Admitting: Nurse Practitioner

## 2017-04-30 DIAGNOSIS — M19042 Primary osteoarthritis, left hand: Secondary | ICD-10-CM

## 2017-04-30 DIAGNOSIS — C20 Malignant neoplasm of rectum: Secondary | ICD-10-CM

## 2017-04-30 DIAGNOSIS — M19041 Primary osteoarthritis, right hand: Secondary | ICD-10-CM

## 2017-04-30 NOTE — Telephone Encounter (Signed)
Requesting: traMADol (ULTRAM) 50 MG tablet Contract UDS Last OV Last Refill  Please Advise

## 2017-05-02 ENCOUNTER — Other Ambulatory Visit: Payer: Self-pay | Admitting: Emergency Medicine

## 2017-05-02 DIAGNOSIS — M19041 Primary osteoarthritis, right hand: Secondary | ICD-10-CM

## 2017-05-02 DIAGNOSIS — M19042 Primary osteoarthritis, left hand: Secondary | ICD-10-CM

## 2017-05-02 MED ORDER — TRAMADOL HCL 50 MG PO TABS: 50.0000 mg | ORAL_TABLET | Freq: Four times a day (QID) | ORAL | 2 refills | Status: DC | PRN

## 2017-05-02 NOTE — Telephone Encounter (Signed)
Checked [place]- nothing unexpected.  Will refil

## 2017-05-02 NOTE — Telephone Encounter (Signed)
Requesting: traMADol (ULTRAM) 50 MG tablet Contract UDS Last OV: 12/05/16 Last Refill: 12/05/16  Please Advise

## 2017-05-09 NOTE — Telephone Encounter (Signed)
Called pt re: Zofran refill request. She denies any new symptoms, stated she has nausea once or twice a week since completing adjuvant treatment. Reviewed with APP, order received and escribed to pharmacy.

## 2017-06-02 NOTE — Progress Notes (Addendum)
[place] at [facility] [address] [place], [place] [address] [phone]  Date:  06/04/2017   Name:  Tayjah Lobdell   DOB:  [birth year]   MRN:  [id number]  PCP:  Darreld Mclean, MD    Chief Complaint: Annual Exam (Pt here for CPE and fasting labs. Flu vaccine today. )   History of Present Illness:  Juleah Paradise is a 56 y.o. very pleasant female patient who presents with the following:  Here today for a CPE From our visit in April of this year:  Here today as a new patient to establish care- former pt of Dr. Birdie Riddle but has not seen here since 2016. She did see another doctor in between [place] but prefers to come out to [facility] History of rectal cancer- she had her colonoscopy in 2014 and that is when they found her cancer.   She is done with chemo and radiation- she sees oncology every 6 months.  For the time being they are monitoring her with CEA levels.   Last colon was in 2016.  She has a colostomy bag- this is working well for her  s/p BTL Her GM and her aunt both had thyroid disease, as does her mom.   We do her paps for her- last was done in the fall, normal and negative HPV She is a bit overdue for her mammogram- she would like for [place] to scheudle this for her She is due for a Td booster  She has noted foot pain for about one year- it will be present when she first gets up in the morning.  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 She uses tramadol prn for this- has been getting from her rheum Dr. Pearla Dubonnet wonders if we could rx this for her. She takes this once a day during the week and BID on the weekend when she is more active Tobacco- She has cut down from 2 PPD to 1/2 PPD- she is continuing to try to cut down  She works in [employer], has 3 children- 2 in college and 1 is 30 yo No grands as of yet  Colonoscopy per Dr. Carlean Purl in July Due for routine labs today Mammo:done this  spring Pap: a year ago, negative but HPV not done Flu shot:will give today Smoking: she is still smoking- she wants to quit, she would like to try wellbutrin perhaps.  She is not sure about chantix, she thinks that the SE Her OA is stable. She uses tramadol prn, and will use tylenol prn. She tries to avoid NSAIDs  She has been taking a 2-4 mile hike the last few weeks once a week- she has noted some right hip pain the last couple of times, but she otherwise does not have any pain in this hip The pain is over the lateral hip,but not in the groin or joint area   This December she will have her 5 year follow-up with oncology and hopefully she will be cleared for routine surveillance  Lab Results  Component Value Date   TSH 0.84 04/19/2016  LMP in 2014- has not yet had a bone density exam  Patient Active Problem List   Diagnosis Date Noted  . Vitamin D insufficiency 04/19/2016  . Tobacco use disorder 04/19/2016  . Absolute anemia 04/19/2016  . Breast cancer screening, high risk patient 04/19/2016  . Chronic back pain 04/19/2016  . Colostomy  in place [facility]) 04/19/2016  . Rectal stenosis 04/04/2015  . Right thyroid nodule 03/07/2014  . Slow transit constipation 03/07/2014  . Personal history of malignant neoplasm of rectum 2014 02/21/2014  . Rectal cancer ([place]) 02/25/2013    Past Medical History:  Diagnosis Date  . Arthritis    LUMBAR  . 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 Complication of anesthesia    "shivers"  . History of kidney stones   . Rectal stenosis   . S/P ileostomy ([place])   . Wears contact lenses     Past Surgical History:  Procedure Laterality Date  . BOWEL RESECTION N/A 08/01/2015   Procedure: SMALL BOWEL RESECTION, takedown of colorectal anastomosis with colostomy;  Surgeon: Leighton Ruff,  MD;  Location: [facility];  Service: General;  Laterality: N/A;  . BUNIONECTOMY Left 1984  . COLOSTOMY    . COLOSTOMY     [place] Colostomy  . COLOSTOMY REVERSAL N/A 11/18/2013   Procedure: LOOP ILEOSTOMY CLOSURE, FLEXIBLE SIGMOIDOSCOPY;  Surgeon: Leighton Ruff, MD;  Location: [facility];  Service: General;  Laterality: N/A;  . EUS N/A 02/25/2013   Procedure: LOWER ENDOSCOPIC ULTRASOUND (EUS);  Surgeon: Milus Banister, MD;  Location: [facility];  Service: Endoscopy;  Laterality: N/A;  radial  . EXAMINATION UNDER ANESTHESIA N/A 07/21/2013   Procedure: EXAM UNDER ANESTHESIA/ RECTAL DILITATION;  Surgeon: Leighton Ruff, MD;  Location: [facility];  Service: General;  Laterality: N/A;  . FLEXIBLE SIGMOIDOSCOPY N/A 07/21/2013   Procedure: FLEXIBLE SIGMOIDOSCOPY;  Surgeon: Leighton Ruff, MD;  Location: [facility];  Service: General;  Laterality: N/A;  . FLEXIBLE SIGMOIDOSCOPY N/A 11/18/2013   Procedure: FLEXIBLE SIGMOIDOSCOPY;  Surgeon: Leighton Ruff, MD;  Location: [facility];  Service: General;  Laterality: N/A;  . FLEXIBLE SIGMOIDOSCOPY N/A 08/01/2015   Procedure: FLEXIBLE SIGMOIDOSCOPY with biopsies;  Surgeon: Leighton Ruff, MD;  Location: [facility];  Service: General;  Laterality: N/A;  . LAPAROSCOPIC LOW ANTERIOR RESECTION N/A 06/14/2013   Procedure: LAPAROSCOPIC LOW ANTERIOR RESECTION AND DIVERTING LAPAROSCOPIC ILEOSTOMY;  Surgeon: Leighton Ruff, MD;  Location: [facility];  Service: General;  Laterality: N/A;  with splenic flexure takdown  . LAPAROSCOPY N/A 08/01/2015   Procedure: LAPAROSCOPY ;  Surgeon: Leighton Ruff, MD;  Location: [facility];  Service: General;  Laterality: N/A;  . [place]    Social History  Substance Use Topics  . Smoking status: Current Some Day Smoker    Packs/day: 0.50    Years: 30.00    Types: Cigarettes  . Smokeless tobacco: Never Used     Comment: 9 cigarettes/ per day. cut back from 2packs per day   . Alcohol use No    Family History   Problem Relation Age of Onset  . Arthritis Mother   . Diabetes Mother   . Thyroid disease Mother   . Arthritis Father   . Diabetes Father   . Parkinson's disease Father   . Colon cancer Neg Hx   . Esophageal cancer Neg Hx   . Pancreatic cancer Neg Hx   . Rectal cancer Neg Hx   . Stomach cancer Neg Hx     Allergies  Allergen Reactions  . Demerol [Meperidine] Nausea Only    Medication list has been reviewed and updated.  Current Outpatient Prescriptions on File Prior to Visit  Medication Sig Dispense Refill  . cholecalciferol (VITAMIN D) 1000 units tablet Take 1,000 Units by mouth daily.    Marland Kitchen ibuprofen (ADVIL,MOTRIN) 200 MG tablet Take 400 mg by mouth every 6 (six) hours as needed (Pain). Reported on 02/12/2016    . Melatonin 5 MG TABS Take by mouth.    . nicotine (NICODERM CQ - DOSED IN MG/24 HOURS) 21 mg/24hr patch Place 1 patch onto the skin daily.    . ondansetron (ZOFRAN) 8 MG tablet TAKE ONE TABLET BY MOUTH EVERY 8 HOURS AS NEEDED FOR NAUSEA 20 tablet 1  . OVER THE COUNTER MEDICATION Take 1 capsule by mouth daily. Biotin  tablet one a day - hair, skin nails    . traMADol (ULTRAM) 50 MG tablet Take 1 tablet (50 mg total) by mouth every 6 (six) hours as needed. 60 tablet 2  . vitamin B-12 (CYANOCOBALAMIN) 500 MCG tablet Take 500 mcg by mouth daily.     Current Facility-Administered Medications on File Prior to Visit  Medication Dose Route Frequency Provider Last Rate Last Dose  . 0.9 %  sodium chloride infusion  500 mL Intravenous Continuous Gatha Mayer, MD        Review of Systems:  As per HPI- otherwise negative. No fever or chills No CP or SOB with exercise Weight is stable Menopausal No cough, ST, nausea, vomiting or diarrhea   Wt Readings from Last 3 Encounters:  06/04/17 115 lb 6.4 oz (52.3 kg)  03/03/17 117 lb (53.1 kg)  02/26/17 117 lb 9.6 oz (53.3 kg)      Physical Examination: Vitals:   06/04/17 0835  BP: 112/82  Pulse: 90  Temp: 97.8 F  (36.6 C)  SpO2: 98%   Vitals:   06/04/17 0835  Weight: 115 lb 6.4 oz (52.3 kg)  Height: 5\' 4"  (1.626 m)   Body mass index is 19.81 kg/m. Ideal Body Weight: Weight in (lb) to have BMI = 25: 145.3  GEN: WDWN, NAD, Non-toxic, A & O x 3, slight build, looks well HEENT: Atraumatic, Normocephalic. Neck supple. No masses, No LAD.  Bilateral TM wnl, oropharynx normal.  PEERL,EOMI.   Ears and Nose: No external deformity. CV: RRR, No M/G/R. No JVD. No thrill. No extra heart sounds. PULM: CTA B, no wheezes, crackles, rhonchi. No retractions. No resp. distress. No accessory muscle use. ABD: S, NT, ND, +BS. No rebound. No HSM. EXTR: No c/c/e NEURO Normal gait.  PSYCH: Normally interactive. Conversant. Not depressed or anxious appearing.  Calm demeanor.  Breast: normal exam, no masses/ dimpling/ discharge Right hip: she is not tender over the greater trocanter, no pain with ROM of hip.  She does have mild tenderness over the right sciatic notch and piriformis muscle     Assessment and Plan: Physical exam  History of anemia - Plan: CBC  Screening for diabetes mellitus - Plan: Comprehensive metabolic panel, Hemoglobin A1c  Screening for hyperlipidemia - Plan: Lipid panel  B12 deficiency - Plan: B12  Vitamin D deficiency - Plan: Vitamin D (25 hydroxy)  Immunization due - Plan: Flu Vaccine QUAD 36+ mos IM (Fluarix & Fluzone Quad PF  Estrogen deficiency - Plan: DG Bone Density  Piriformis syndrome of right side  Here today for a CPE Labs pending as above Ordered dexa for her Will plan further follow- up pending labs. Discussed piriformis syndrome and gave hand- out for her describing stretches, and discussed tennis ball massage   Signed Lamar Blinks, MD  Received her labs- message to  pt  Results for orders placed or performed in visit on 06/04/17  CBC  Result Value Ref Range   WBC 4.6 4.0 - 10.5 K/uL   RBC 4.84 3.87 - 5.11 Mil/uL   Platelets 300.0 150.0 - 400.0 K/uL    Hemoglobin 15.4 (H) 12.0 - 15.0 g/dL   HCT 45.8 36.0 - 46.0 %   MCV 94.6 78.0 - 100.0 fl   MCHC 33.6 30.0 - 36.0 g/dL   RDW 13.7 11.5 - 15.5 %  Comprehensive metabolic panel  Result Value Ref Range   Sodium 140 135 - 145 mEq/L   Potassium 4.5 3.5 - 5.1 mEq/L   Chloride 105 96 - 112 mEq/L   CO2 30 19 - 32 mEq/L   Glucose, Bld 84 70 - 99 mg/dL   BUN 16 6 - 23 mg/dL   Creatinine, Ser 0.81 0.40 - 1.20 mg/dL   Total Bilirubin 0.3 0.2 - 1.2 mg/dL   Alkaline Phosphatase 89 39 - 117 U/L   AST 14 0 - 37 U/L   ALT 12 0 - 35 U/L   Total Protein 7.4 6.0 - 8.3 g/dL   Albumin 4.1 3.5 - 5.2 g/dL   Calcium 10.3 8.4 - 10.5 mg/dL   GFR 77.87 >60.00 mL/min  Hemoglobin A1c  Result Value Ref Range   Hgb A1c MFr Bld 5.6 4.6 - 6.5 %  Lipid panel  Result Value Ref Range   Cholesterol 179 0 - 200 mg/dL   Triglycerides 198.0 (H) 0.0 - 149.0 mg/dL   HDL 54.20 >39.00 mg/dL   VLDL 39.6 0.0 - 40.0 mg/dL   LDL Cholesterol 86 0 - 99 mg/dL   Total CHOL/HDL Ratio 3    NonHDL 125.27   Vitamin D (25 hydroxy)  Result Value Ref Range   VITD 44.75 30.00 - 100.00 ng/mL  B12  Result Value Ref Range   Vitamin B-12 1,421 (H) 211 - 911 pg/mL

## 2017-06-04 ENCOUNTER — Encounter: Payer: Self-pay | Admitting: Family Medicine

## 2017-06-04 ENCOUNTER — Ambulatory Visit (INDEPENDENT_AMBULATORY_CARE_PROVIDER_SITE_OTHER): Payer: BLUE CROSS/BLUE SHIELD | Admitting: Family Medicine

## 2017-06-04 VITALS — BP 112/82 | HR 90 | Temp 97.8°F | Ht 64.0 in | Wt 115.4 lb

## 2017-06-04 DIAGNOSIS — E538 Deficiency of other specified B group vitamins: Secondary | ICD-10-CM | POA: Diagnosis not present

## 2017-06-04 DIAGNOSIS — Z23 Encounter for immunization: Secondary | ICD-10-CM | POA: Diagnosis not present

## 2017-06-04 DIAGNOSIS — Z0000 Encounter for general adult medical examination without abnormal findings: Secondary | ICD-10-CM | POA: Diagnosis not present

## 2017-06-04 DIAGNOSIS — Z862 Personal history of diseases of the blood and blood-forming organs and certain disorders involving the immune mechanism: Secondary | ICD-10-CM

## 2017-06-04 DIAGNOSIS — E2839 Other primary ovarian failure: Secondary | ICD-10-CM

## 2017-06-04 DIAGNOSIS — E559 Vitamin D deficiency, unspecified: Secondary | ICD-10-CM

## 2017-06-04 DIAGNOSIS — G5701 Lesion of sciatic nerve, right lower limb: Secondary | ICD-10-CM

## 2017-06-04 DIAGNOSIS — Z13220 Encounter for screening for lipoid disorders: Secondary | ICD-10-CM

## 2017-06-04 DIAGNOSIS — Z131 Encounter for screening for diabetes mellitus: Secondary | ICD-10-CM

## 2017-06-04 LAB — CBC
HCT: 45.8 % (ref 36.0–46.0)
Hemoglobin: 15.4 g/dL — ABNORMAL HIGH (ref 12.0–15.0)
MCHC: 33.6 g/dL (ref 30.0–36.0)
MCV: 94.6 fl (ref 78.0–100.0)
Platelets: 300 10*3/uL (ref 150.0–400.0)
RBC: 4.84 Mil/uL (ref 3.87–5.11)
RDW: 13.7 % (ref 11.5–15.5)
WBC: 4.6 10*3/uL (ref 4.0–10.5)

## 2017-06-04 LAB — COMPREHENSIVE METABOLIC PANEL
ALBUMIN: 4.1 g/dL (ref 3.5–5.2)
ALT: 12 U/L (ref 0–35)
AST: 14 U/L (ref 0–37)
Alkaline Phosphatase: 89 U/L (ref 39–117)
BUN: 16 mg/dL (ref 6–23)
CHLORIDE: 105 meq/L (ref 96–112)
CO2: 30 mEq/L (ref 19–32)
CREATININE: 0.81 mg/dL (ref 0.40–1.20)
Calcium: 10.3 mg/dL (ref 8.4–10.5)
GFR: 77.87 mL/min (ref >60.00)
GLUCOSE: 84 mg/dL (ref 70–99)
POTASSIUM: 4.5 meq/L (ref 3.5–5.1)
Sodium: 140 mEq/L (ref 135–145)
TOTAL PROTEIN: 7.4 g/dL (ref 6.0–8.3)
Total Bilirubin: 0.3 mg/dL (ref 0.2–1.2)

## 2017-06-04 LAB — LIPID PANEL
CHOL/HDL RATIO: 3
Cholesterol: 179 mg/dL (ref 0–200)
HDL: 54.2 mg/dL (ref >39.00)
LDL Cholesterol: 86 mg/dL (ref 0–99)
NONHDL: 125.27
Triglycerides: 198 mg/dL — ABNORMAL HIGH (ref 0.0–149.0)
VLDL: 39.6 mg/dL (ref 0.0–40.0)

## 2017-06-04 LAB — HEMOGLOBIN A1C: HEMOGLOBIN A1C: 5.6 % (ref 4.6–6.5)

## 2017-06-04 LAB — VITAMIN D 25 HYDROXY (VIT D DEFICIENCY, FRACTURES): VITD: 44.75 ng/mL (ref 30.00–100.00)

## 2017-06-04 LAB — VITAMIN B12: Vitamin B-12: 1421 pg/mL — ABNORMAL HIGH (ref 211–911)

## 2017-06-04 NOTE — Patient Instructions (Addendum)
It was a pleasure to see you again today!  I will be in touch with your labs asap I suspect that your hip and leg pain may be due to irritation of the sciatic nerve due to muscles in the buttock (likiely the piriformis muscle).  Please work on some of the stretches that I gave you, and also try tennis ball massage.  Let me know if this does not improve Please alert me if you would like an rx for wellbutrin for smoking cessation  I will set you up for a bone density scan   Health Maintenance, Female Adopting a healthy lifestyle and getting preventive care can go a long way to promote health and wellness. Talk with your health care provider about what schedule of regular examinations is right for you. This is a good chance for you to check in with your provider about disease prevention and staying healthy. In between checkups, there are plenty of things you can do on your own. Experts have done a lot of research about which lifestyle changes and preventive measures are most likely to keep you healthy. Ask your health care provider for more information. Weight and diet Eat a healthy diet  Be sure to include plenty of vegetables, fruits, low-fat dairy products, and lean protein.  Do not eat a lot of foods high in solid fats, added sugars, or salt.  Get regular exercise. This is one of the most important things you can do for your health. ? Most adults should exercise for at least 150 minutes each week. The exercise should increase your heart rate and make you sweat (moderate-intensity exercise). ? Most adults should also do strengthening exercises at least twice a week. This is in addition to the moderate-intensity exercise.  Maintain a healthy weight  Body mass index (BMI) is a measurement that can be used to identify possible weight problems. It estimates body fat based on height and weight. Your health care provider can help determine your BMI and help you achieve or maintain a healthy  weight.  For females 83 years of age and older: ? A BMI below 18.5 is considered underweight. ? A BMI of 18.5 to 24.9 is normal. ? A BMI of 25 to 29.9 is considered overweight. ? A BMI of 30 and above is considered obese.  Watch levels of cholesterol and blood lipids  You should start having your blood tested for lipids and cholesterol at 55 years of age, then have this test every 5 years.  You may need to have your cholesterol levels checked more often if: ? Your lipid or cholesterol levels are high. ? You are older than 55 years of age. ? You are at high risk for heart disease.  Cancer screening Lung Cancer  Lung cancer screening is recommended for adults 58-87 years old who are at high risk for lung cancer because of a history of smoking.  A yearly low-dose CT scan of the lungs is recommended for people who: ? Currently smoke. ? Have quit within the past 15 years. ? Have at least a 30-pack-year history of smoking. A pack year is smoking an average of one pack of cigarettes a day for 1 year.  Yearly screening should continue until it has been 15 years since you quit.  Yearly screening should stop if you develop a health problem that would prevent you from having lung cancer treatment.  Breast Cancer  Practice breast self-awareness. This means understanding how your breasts normally appear and feel.  It also means doing regular breast self-exams. Let your health care provider know about any changes, no matter how small.  If you are in your 20s or 30s, you should have a clinical breast exam (CBE) by a health care provider every 1-3 years as part of a regular health exam.  If you are 17 or older, have a CBE every year. Also consider having a breast X-ray (mammogram) every year.  If you have a family history of breast cancer, talk to your health care provider about genetic screening.  If you are at high risk for breast cancer, talk to your health care provider about having an  MRI and a mammogram every year.  Breast cancer gene (BRCA) assessment is recommended for women who have family members with BRCA-related cancers. BRCA-related cancers include: ? Breast. ? Ovarian. ? Tubal. ? Peritoneal cancers.  Results of the assessment will determine the need for genetic counseling and BRCA1 and BRCA2 testing.  Cervical Cancer Your health care provider may recommend that you be screened regularly for cancer of the pelvic organs (ovaries, uterus, and vagina). This screening involves a pelvic examination, including checking for microscopic changes to the surface of your cervix (Pap test). You may be encouraged to have this screening done every 3 years, beginning at age 58.  For women ages 47-65, health care providers may recommend pelvic exams and Pap testing every 3 years, or they may recommend the Pap and pelvic exam, combined with testing for human papilloma virus (HPV), every 5 years. Some types of HPV increase your risk of cervical cancer. Testing for HPV may also be done on women of any age with unclear Pap test results.  Other health care providers may not recommend any screening for nonpregnant women who are considered low risk for pelvic cancer and who do not have symptoms. Ask your health care provider if a screening pelvic exam is right for you.  If you have had past treatment for cervical cancer or a condition that could lead to cancer, you need Pap tests and screening for cancer for at least 20 years after your treatment. If Pap tests have been discontinued, your risk factors (such as having a new sexual partner) need to be reassessed to determine if screening should resume. Some women have medical problems that increase the chance of getting cervical cancer. In these cases, your health care provider may recommend more frequent screening and Pap tests.  Colorectal Cancer  This type of cancer can be detected and often prevented.  Routine colorectal cancer screening  usually begins at 55 years of age and continues through 55 years of age.  Your health care provider may recommend screening at an earlier age if you have risk factors for colon cancer.  Your health care provider may also recommend using home test kits to check for hidden blood in the stool.  A small camera at the end of a tube can be used to examine your colon directly (sigmoidoscopy or colonoscopy). This is done to check for the earliest forms of colorectal cancer.  Routine screening usually begins at age 37.  Direct examination of the colon should be repeated every 5-10 years through 55 years of age. However, you may need to be screened more often if early forms of precancerous polyps or small growths are found.  Skin Cancer  Check your skin from head to toe regularly.  Tell your health care provider about any new moles or changes in moles, especially if there is a  change in a mole's shape or color.  Also tell your health care provider if you have a mole that is larger than the size of a pencil eraser.  Always use sunscreen. Apply sunscreen liberally and repeatedly throughout the day.  Protect yourself by wearing long sleeves, pants, a wide-brimmed hat, and sunglasses whenever you are outside.  Heart disease, diabetes, and high blood pressure  High blood pressure causes heart disease and increases the risk of stroke. High blood pressure is more likely to develop in: ? People who have blood pressure in the high end of the normal range (130-139/85-89 mm Hg). ? People who are overweight or obese. ? People who are African American.  If you are 42-103 years of age, have your blood pressure checked every 3-5 years. If you are 68 years of age or older, have your blood pressure checked every year. You should have your blood pressure measured twice-once when you are at a hospital or clinic, and once when you are not at a hospital or clinic. Record the average of the two measurements. To check  your blood pressure when you are not at a hospital or clinic, you can use: ? An automated blood pressure machine at a pharmacy. ? A home blood pressure monitor.  If you are between 48 years and 52 years old, ask your health care provider if you should take aspirin to prevent strokes.  Have regular diabetes screenings. This involves taking a blood sample to check your fasting blood sugar level. ? If you are at a normal weight and have a low risk for diabetes, have this test once every three years after 55 years of age. ? If you are overweight and have a high risk for diabetes, consider being tested at a younger age or more often. Preventing infection Hepatitis B  If you have a higher risk for hepatitis B, you should be screened for this virus. You are considered at high risk for hepatitis B if: ? You were born in a country where hepatitis B is common. Ask your health care provider which countries are considered high risk. ? Your parents were born in a high-risk country, and you have not been immunized against hepatitis B (hepatitis B vaccine). ? You have HIV or AIDS. ? You use needles to inject street drugs. ? You live with someone who has hepatitis B. ? You have had sex with someone who has hepatitis B. ? You get hemodialysis treatment. ? You take certain medicines for conditions, including cancer, organ transplantation, and autoimmune conditions.  Hepatitis C  Blood testing is recommended for: ? Everyone born from [birth year] through 1964. ? Anyone with known risk factors for hepatitis C.  Sexually transmitted infections (STIs)  You should be screened for sexually transmitted infections (STIs) including gonorrhea and chlamydia if: ? You are sexually active and are younger than 55 years of age. ? You are older than 55 years of age and your health care provider tells you that you are at risk for this type of infection. ? Your sexual activity has changed since you were last screened and you  are at an increased risk for chlamydia or gonorrhea. Ask your health care provider if you are at risk.  If you do not have HIV, but are at risk, it may be recommended that you take a prescription medicine daily to prevent HIV infection. This is called pre-exposure prophylaxis (PrEP). You are considered at risk if: ? You are sexually active and do not regularly  use condoms or know the HIV status of your partner(s). ? You take drugs by injection. ? You are sexually active with a partner who has HIV.  Talk with your health care provider about whether you are at high risk of being infected with HIV. If you choose to begin PrEP, you should first be tested for HIV. You should then be tested every 3 months for as long as you are taking PrEP. Pregnancy  If you are premenopausal and you may become pregnant, ask your health care provider about preconception counseling.  If you may become pregnant, take 400 to 800 micrograms (mcg) of folic acid every day.  If you want to prevent pregnancy, talk to your health care provider about birth control (contraception). Osteoporosis and menopause  Osteoporosis is a disease in which the bones lose minerals and strength with aging. This can result in serious bone fractures. Your risk for osteoporosis can be identified using a bone density scan.  If you are 23 years of age or older, or if you are at risk for osteoporosis and fractures, ask your health care provider if you should be screened.  Ask your health care provider whether you should take a calcium or vitamin D supplement to lower your risk for osteoporosis.  Menopause may have certain physical symptoms and risks.  Hormone replacement therapy may reduce some of these symptoms and risks. Talk to your health care provider about whether hormone replacement therapy is right for you. Follow these instructions at home:  Schedule regular health, dental, and eye exams.  Stay current with your  immunizations.  Do not use any tobacco products including cigarettes, chewing tobacco, or electronic cigarettes.  If you are pregnant, do not drink alcohol.  If you are breastfeeding, limit how much and how often you drink alcohol.  Limit alcohol intake to no more than 1 drink per day for nonpregnant women. One drink equals 12 ounces of beer, 5 ounces of wine, or 1 ounces of hard liquor.  Do not use street drugs.  Do not share needles.  Ask your health care provider for help if you need support or information about quitting drugs.  Tell your health care provider if you often feel depressed.  Tell your health care provider if you have ever been abused or do not feel safe at home. This information is not intended to replace advice given to you by your health care provider. Make sure you discuss any questions you have with your health care provider. Document Released: 02/18/2011 Document Revised: 01/11/2016 Document Reviewed: 05/09/2015 Elsevier Interactive Patient Education  [employer].

## 2017-06-16 ENCOUNTER — Ambulatory Visit (HOSPITAL_BASED_OUTPATIENT_CLINIC_OR_DEPARTMENT_OTHER)
Admission: RE | Admit: 2017-06-16 | Discharge: 2017-06-16 | Disposition: A | Discharge status: Home / Self Care | Payer: BLUE CROSS/BLUE SHIELD | Source: Ambulatory Visit | Attending: Family Medicine | Admitting: Family Medicine

## 2017-06-16 DIAGNOSIS — Z8262 Family history of osteoporosis: Secondary | ICD-10-CM | POA: Insufficient documentation

## 2017-06-16 DIAGNOSIS — F17200 Nicotine dependence, unspecified, uncomplicated: Secondary | ICD-10-CM | POA: Diagnosis not present

## 2017-06-16 DIAGNOSIS — Z13820 Encounter for screening for osteoporosis: Secondary | ICD-10-CM | POA: Insufficient documentation

## 2017-06-16 DIAGNOSIS — M069 Rheumatoid arthritis, unspecified: Secondary | ICD-10-CM | POA: Insufficient documentation

## 2017-06-16 DIAGNOSIS — E2839 Other primary ovarian failure: Secondary | ICD-10-CM | POA: Insufficient documentation

## 2017-06-16 DIAGNOSIS — M810 Age-related osteoporosis without current pathological fracture: Secondary | ICD-10-CM | POA: Insufficient documentation

## 2017-06-16 DIAGNOSIS — Z780 Asymptomatic menopausal state: Secondary | ICD-10-CM | POA: Insufficient documentation

## 2017-06-17 ENCOUNTER — Encounter: Payer: Self-pay | Admitting: Family Medicine

## 2017-06-17 DIAGNOSIS — M810 Age-related osteoporosis without current pathological fracture: Secondary | ICD-10-CM | POA: Insufficient documentation

## 2017-06-17 DIAGNOSIS — F17200 Nicotine dependence, unspecified, uncomplicated: Secondary | ICD-10-CM

## 2017-06-17 MED ORDER — ALENDRONATE SODIUM 70 MG PO TABS: 70.0000 mg | ORAL_TABLET | ORAL | 11 refills | Status: DC

## 2017-06-17 MED ORDER — BUPROPION HCL ER (SR) 150 MG PO TB12: 150.0000 mg | ORAL_TABLET | Freq: Two times a day (BID) | ORAL | 2 refills | Status: DC

## 2017-06-30 ENCOUNTER — Encounter: Payer: Self-pay | Admitting: Family Medicine

## 2017-06-30 ENCOUNTER — Ambulatory Visit: Payer: BLUE CROSS/BLUE SHIELD | Admitting: Family Medicine

## 2017-06-30 VITALS — BP 108/74 | HR 89 | Temp 98.2°F | Ht 63.0 in | Wt 116.6 lb

## 2017-06-30 DIAGNOSIS — R197 Diarrhea, unspecified: Secondary | ICD-10-CM

## 2017-06-30 DIAGNOSIS — E875 Hyperkalemia: Secondary | ICD-10-CM | POA: Diagnosis not present

## 2017-06-30 DIAGNOSIS — R110 Nausea: Secondary | ICD-10-CM

## 2017-06-30 LAB — CBC
HEMATOCRIT: 45.7 % (ref 36.0–46.0)
Hemoglobin: 15.1 g/dL — ABNORMAL HIGH (ref 12.0–15.0)
MCHC: 33 g/dL (ref 30.0–36.0)
MCV: 95.6 fl (ref 78.0–100.0)
PLATELETS: 350 10*3/uL (ref 150.0–400.0)
RBC: 4.78 Mil/uL (ref 3.87–5.11)
RDW: 13.5 % (ref 11.5–15.5)
WBC: 7.4 10*3/uL (ref 4.0–10.5)

## 2017-06-30 LAB — COMPREHENSIVE METABOLIC PANEL
ALT: 19 U/L (ref 0–35)
AST: 16 U/L (ref 0–37)
Albumin: 3.9 g/dL (ref 3.5–5.2)
Alkaline Phosphatase: 81 U/L (ref 39–117)
BUN: 12 mg/dL (ref 6–23)
CALCIUM: 10.3 mg/dL (ref 8.4–10.5)
CHLORIDE: 106 meq/L (ref 96–112)
CO2: 30 meq/L (ref 19–32)
CREATININE: 0.87 mg/dL (ref 0.40–1.20)
GFR: 71.69 mL/min (ref >60.00)
Glucose, Bld: 90 mg/dL (ref 70–99)
POTASSIUM: 5.7 meq/L — AB (ref 3.5–5.1)
Sodium: 141 mEq/L (ref 135–145)
Total Bilirubin: 0.4 mg/dL (ref 0.2–1.2)
Total Protein: 7.1 g/dL (ref 6.0–8.3)

## 2017-06-30 NOTE — Addendum Note (Signed)
Addended by: Lamar Blinks C on: 06/30/2017 08:25 PM   Modules accepted: Orders

## 2017-06-30 NOTE — Progress Notes (Addendum)
[facility] [address], [place], [place] [address] [id number]  Date:  06/30/2017   Name:  Angela Schmidt   DOB:  October 19, 1960   MRN:  [id number]  PCP:  Darreld Mclean, MD    Chief Complaint: Blood In Stools (c/o blood in stool, nausea, and diarrhea x 2 days. )   History of Present Illness:  Angela Schmidt is a 56 y.o. very pleasant female patient who presents with the following:  She has a history of rectal cancer and wears an ostomy bag Today is Monday-  On Saturday afternoon she felt very nauseated and then started having loose stools, diarrhea into her bag.  She developed apparent blood in her stools, which she could see in her bag. She otherwise felt ok except for nausea and gas pains.   Sunday she felt better, but then last night she had diarrhea again-   However no blood this time.  She did take some imodium She did have to get up and empty her bag several times last night. It stopped again this morning She has had no BM for about 3 hours now  No vomiting She is not aware of any suspicious foods.   No sick contacts   She did start on fosamax 2 weeks ago, and took her 2nd dose just the day before her sx began. She took the first dose with no problems   She had a colonoscopy just a few months ago.   She has not noted any fever, felt a bit chilled but it was cold out yesterday anyway  No recent abx use  She is able to eat- right now is sticking to a light diet of toast, soup, etc  She otherwise feels pretty good today  Patient Active Problem List   Diagnosis Date Noted  . Osteoporosis 06/17/2017  . Vitamin D insufficiency 04/19/2016  . Tobacco use disorder 04/19/2016  . Absolute anemia 04/19/2016  . Breast cancer screening, high risk patient 04/19/2016  . Chronic back pain 04/19/2016  . Colostomy in place [facility]) 04/19/2016  . Rectal stenosis 04/04/2015  . Right thyroid nodule 03/07/2014  . Slow  transit constipation 03/07/2014  . Personal history of malignant neoplasm of rectum 2014 02/21/2014  . Rectal cancer ([place]) 02/25/2013    Past Medical History:  Diagnosis Date  . Arthritis    LUMBAR  . 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 Complication of anesthesia    "shivers"  . History of kidney stones   . Rectal stenosis   . S/P ileostomy ([place])   . Wears contact lenses     Past Surgical History:  Procedure Laterality Date  . BUNIONECTOMY Left 1984  . COLOSTOMY    . COLOSTOMY     [place] Colostomy  . TUBAL LIGATION  1994    Social History   Tobacco Use  . Smoking status: Current Some Day Smoker    Packs/day: 0.50    Years: 30.00    Pack years: 15.00    Types: Cigarettes  . Smokeless tobacco: Never Used  . Tobacco comment: 9 cigarettes/ per day. cut back from 2packs per day   Substance Use Topics  . Alcohol use: No    Alcohol/week: 0.0 oz  . Drug use: No  Family History  Problem Relation Age of Onset  . Arthritis Mother   . Diabetes Mother   . Thyroid disease Mother   . Arthritis Father   . Diabetes Father   . Parkinson's disease Father   . Colon cancer Neg Hx   . Esophageal cancer Neg Hx   . Pancreatic cancer Neg Hx   . Rectal cancer Neg Hx   . Stomach cancer Neg Hx     Allergies  Allergen Reactions  . Demerol [Meperidine] Nausea Only    Medication list has been reviewed and updated.  Current Outpatient Medications on File Prior to Visit  Medication Sig Dispense Refill  . alendronate (FOSAMAX) 70 MG tablet Take 1 tablet (70 mg total) by mouth every 7 (seven) days. Take with a full glass of water on an empty stomach. 4 tablet 11  . buPROPion (WELLBUTRIN SR) 150 MG 12 hr tablet Take 1 tablet (150 mg total) by mouth 2 (two) times daily. Use for 12 weeks for smoking  cessation 60 tablet 2  . cholecalciferol (VITAMIN D) 1000 units tablet Take 1,000 Units by mouth daily.    Marland Kitchen ibuprofen (ADVIL,MOTRIN) 200 MG tablet Take 400 mg by mouth every 6 (six) hours as needed (Pain). Reported on 02/12/2016    . Melatonin 5 MG TABS Take by mouth.    . nicotine (NICODERM CQ - DOSED IN MG/24 HOURS) 21 mg/24hr patch Place 1 patch onto the skin daily.    . ondansetron (ZOFRAN) 8 MG tablet TAKE ONE TABLET BY MOUTH EVERY 8 HOURS AS NEEDED FOR NAUSEA 20 tablet 1  . OVER THE COUNTER MEDICATION Take 1 capsule by mouth daily. Biotin  tablet one a day - hair, skin nails    . traMADol (ULTRAM) 50 MG tablet Take 1 tablet (50 mg total) by mouth every 6 (six) hours as needed. 60 tablet 2  . vitamin B-12 (CYANOCOBALAMIN) 500 MCG tablet Take 500 mcg by mouth daily.     No current facility-administered medications on file prior to visit.     Review of Systems:  As per HPI- otherwise negative.   Physical Examination: Vitals:   06/30/17 1143  BP: 108/74  Pulse: 89  Temp: 98.2 F (36.8 C)  SpO2: 95%   Vitals:   06/30/17 1143  Weight: 116 lb 9.6 oz (52.9 kg)  Height: 5\' 3"  (1.6 m)   Body mass index is 20.65 kg/m. Ideal Body Weight: Weight in (lb) to have BMI = 25: 140.8  GEN: WDWN, NAD, Non-toxic, A & O x 3, slim build, looks well HEENT: Atraumatic, Normocephalic. Neck supple. No masses, No LAD.  Bilateral TM wnl, oropharynx normal.  PEERL,EOMI.   Ears and Nose: No external deformity. CV: RRR, No M/G/R. No JVD. No thrill. No extra heart sounds. PULM: CTA B, no wheezes, crackles, rhonchi. No retractions. No resp. distress. No accessory muscle use. ABD: S, NT, ND, +BS. No rebound. No HSM.  Belly is benign  EXTR: No c/c/e NEURO Normal gait.  PSYCH: Normally interactive. Conversant. Not depressed or anxious appearing.  Calm demeanor.  Ostomy bag in place, looks fine   BP Readings from Last 3 Encounters:  06/30/17 108/74  06/04/17 112/82  03/03/17 92/71     Assessment  and Plan: Bloody diarrhea - Plan: CBC, Comprehensive metabolic panel, Stool culture  Nausea without vomiting  She got sick with diarrhea and some blood in her stool over the weekend. She has history of rectal cancer and has an ostomy bag. She is  feeling better now but wanted to be sure all is well Labs pending as above Just had a negative colonoscopy and will see her oncologist next month She will collect a stool sample if this comes back.  However otherwise suspect she had a food borne or viral gastroenteritis which seems to be improving   Signed Lamar Blinks, MD  Received her labs, message to pt- will need to recheck her K in 1-2 days.  Please hold NSAIDS until recheck is done  Your blood count looks fine Your metabolic profile is ok, except your potassium is high.  I do not see any obvious reason why this would be so, and wonder if this may be due to trauma to the blood cells during processing.  However, as high potassium can be dangerous we need to recheck this in short order.  Your potassium is not in the critical range, but I want to make sure it comes back to normal.  Please come by in the next 1-2 days for a repeat potassium level- lab visit only, no need to see me.  In the meantime please avoid using NSAID medications like ibuprofen.  I will be in touch as soon as I get your repeat potassium level!  Results for orders placed or performed in visit on 06/30/17  CBC  Result Value Ref Range   WBC 7.4 4.0 - 10.5 K/uL   RBC 4.78 3.87 - 5.11 Mil/uL   Platelets 350.0 150.0 - 400.0 K/uL   Hemoglobin 15.1 (H) 12.0 - 15.0 g/dL   HCT 45.7 36.0 - 46.0 %   MCV 95.6 78.0 - 100.0 fl   MCHC 33.0 30.0 - 36.0 g/dL   RDW 13.5 11.5 - 15.5 %  Comprehensive metabolic panel  Result Value Ref Range   Sodium 141 135 - 145 mEq/L   Potassium 5.7 (H) 3.5 - 5.1 mEq/L   Chloride 106 96 - 112 mEq/L   CO2 30 19 - 32 mEq/L   Glucose, Bld 90 70 - 99 mg/dL   BUN 12 6 - 23 mg/dL   Creatinine, Ser 0.87  0.40 - 1.20 mg/dL   Total Bilirubin 0.4 0.2 - 1.2 mg/dL   Alkaline Phosphatase 81 39 - 117 U/L   AST 16 0 - 37 U/L   ALT 19 0 - 35 U/L   Total Protein 7.1 6.0 - 8.3 g/dL   Albumin 3.9 3.5 - 5.2 g/dL   Calcium 10.3 8.4 - 10.5 mg/dL   GFR 71.69 >60.00 mL/min

## 2017-06-30 NOTE — Patient Instructions (Signed)
Please go to the lab, then you can go home I'll be in touch with your results asap If you continue to have any of the diarrhea especially if blood- collect a stool sample for testing Continue a light, easy to digest diet for now!    Please let me know if your symptoms continue or if you do not get back to normal in the next couple of days- Sooner if worse.

## 2017-07-02 ENCOUNTER — Other Ambulatory Visit (INDEPENDENT_AMBULATORY_CARE_PROVIDER_SITE_OTHER): Payer: BLUE CROSS/BLUE SHIELD

## 2017-07-02 ENCOUNTER — Encounter: Payer: Self-pay | Admitting: Family Medicine

## 2017-07-02 DIAGNOSIS — E875 Hyperkalemia: Secondary | ICD-10-CM

## 2017-07-02 LAB — POTASSIUM: Potassium: 4.9 mEq/L (ref 3.5–5.1)

## 2017-07-31 ENCOUNTER — Other Ambulatory Visit: Payer: Self-pay | Admitting: Family Medicine

## 2017-07-31 ENCOUNTER — Encounter: Payer: Self-pay | Admitting: Family Medicine

## 2017-07-31 DIAGNOSIS — M19041 Primary osteoarthritis, right hand: Secondary | ICD-10-CM

## 2017-07-31 DIAGNOSIS — M19042 Primary osteoarthritis, left hand: Secondary | ICD-10-CM

## 2017-07-31 MED ORDER — TRAMADOL HCL 50 MG PO TABS: 50.0000 mg | ORAL_TABLET | Freq: Four times a day (QID) | ORAL | 2 refills | Status: DC | PRN

## 2017-08-01 ENCOUNTER — Other Ambulatory Visit: Payer: Self-pay | Admitting: Family Medicine

## 2017-08-01 DIAGNOSIS — M19041 Primary osteoarthritis, right hand: Secondary | ICD-10-CM

## 2017-08-01 DIAGNOSIS — M19042 Primary osteoarthritis, left hand: Secondary | ICD-10-CM

## 2017-08-04 ENCOUNTER — Encounter: Payer: Self-pay | Admitting: Family Medicine

## 2017-08-04 NOTE — Telephone Encounter (Signed)
Received refill request for traMADol (ULTRAM) 50 MG tablet to be sent to [facility] [address] instead of [facility] on [address].

## 2017-08-08 ENCOUNTER — Other Ambulatory Visit: Payer: BLUE CROSS/BLUE SHIELD

## 2017-08-08 ENCOUNTER — Ambulatory Visit: Payer: BLUE CROSS/BLUE SHIELD | Admitting: Nurse Practitioner

## 2017-09-08 ENCOUNTER — Telehealth: Payer: Self-pay | Admitting: Oncology

## 2017-09-08 NOTE — Telephone Encounter (Signed)
Patient stopped by to reschedule 12/21 lab/fu and needs early AM appointment. Gave patient new appointments for lab/LT 2/7. Patient has copy of appointment.

## 2017-09-25 ENCOUNTER — Encounter: Payer: Self-pay | Admitting: Nurse Practitioner

## 2017-09-25 ENCOUNTER — Inpatient Hospital Stay: Payer: BLUE CROSS/BLUE SHIELD | Attending: Nurse Practitioner | Admitting: Nurse Practitioner

## 2017-09-25 ENCOUNTER — Inpatient Hospital Stay: Payer: BLUE CROSS/BLUE SHIELD

## 2017-09-25 ENCOUNTER — Telehealth: Payer: Self-pay | Admitting: Oncology

## 2017-09-25 VITALS — BP 115/74 | HR 82 | Temp 97.5°F | Resp 16 | Ht 63.0 in | Wt 118.0 lb

## 2017-09-25 DIAGNOSIS — Z923 Personal history of irradiation: Secondary | ICD-10-CM

## 2017-09-25 DIAGNOSIS — C20 Malignant neoplasm of rectum: Secondary | ICD-10-CM

## 2017-09-25 DIAGNOSIS — Z85048 Personal history of other malignant neoplasm of rectum, rectosigmoid junction, and anus: Secondary | ICD-10-CM

## 2017-09-25 DIAGNOSIS — M069 Rheumatoid arthritis, unspecified: Secondary | ICD-10-CM

## 2017-09-25 DIAGNOSIS — F17210 Nicotine dependence, cigarettes, uncomplicated: Secondary | ICD-10-CM | POA: Diagnosis not present

## 2017-09-25 DIAGNOSIS — Z9221 Personal history of antineoplastic chemotherapy: Secondary | ICD-10-CM | POA: Diagnosis not present

## 2017-09-25 LAB — CEA (IN HOUSE-CHCC): CEA (CHCC-IN HOUSE): 3.42 ng/mL (ref 0.00–5.00)

## 2017-09-25 NOTE — Progress Notes (Signed)
  [place] OFFICE PROGRESS NOTE   Diagnosis: Rectal cancer  INTERVAL HISTORY:   Angela Schmidt returns as scheduled.  She feels well.  Colonoscopy is functioning normally.  No bleeding.  No abdominal pain.  She has a good appetite.  She is trying to quit smoking.  She reports currently 5-6 cigarettes/day.  She has been smoking for approximately 25 years.  Objective:  Vital signs in last 24 hours:  Blood pressure 115/74, pulse 82, temperature (!) 97.5 F (36.4 C), temperature source Oral, resp. rate 16, height 5\' 3"  (1.6 m), weight 118 lb (53.5 kg), last menstrual period 03/10/2013, SpO2 100 %.    HEENT: Neck without mass. Lymphatics: No palpable cervical, supraclavicular, axillary or inguinal lymph nodes. Resp: Distant breath sounds.  No respiratory distress. Cardio: Regular rate and rhythm. GI: Abdomen soft and nontender.  No hepatomegaly.  Left lower quadrant colostomy. Vascular: No leg edema.   Lab Results:  Lab Results  Component Value Date   WBC 7.4 06/30/2017   HGB 15.1 (H) 06/30/2017   HCT 45.7 06/30/2017   MCV 95.6 06/30/2017   PLT 350.0 06/30/2017   NEUTROABS 4.8 04/19/2016    Imaging:  No results found.  Medications: I have reviewed the patient's current medications.  Assessment/Plan: 1. Rectal cancer, clinical stage III (uT3 uN1) measured at 7 cm from the anal verge.  No evidence for metastatic disease on CTs of the chest, abdomen and pelvis 02/24/2013.   Elevated CEA 02/22/2013 (13.6). Repeat CEA 08/26/2013 normal (1.1).   Initiation of neoadjuvant Xeloda and radiation on 03/22/2013, completed 04/29/2013.   Status post a low anterior resection and diverting ileostomy on 06/14/2013-ypT2,ypN0.   Cycle 1 adjuvant Xeloda beginning 07/16/2013.   Cycle 2 adjuvant Xeloda beginning 08/06/2013.   Cycle 3 adjuvant Xeloda beginning 08/27/2013.   Cycle 4 adjuvant Xeloda beginning 09/17/2013.   Cycle 5 adjuvant Xeloda beginning  10/08/2013.  Colonoscopy 02/21/2014. Evidence of prior surgical anastomosis in the anal canal, moderate stenosis and a small opening in the mucosa of unclear significance if any. No signs of cancer/polyps. Colon mucosa otherwise normal. Repeat colonoscopy recommended in 3 years.  02/23/2014 CEA 1.5.  CT chest/abdomen/pelvis 02/23/2014. Postsurgical changes related to the low anterior resection, diverting ileostomy and subsequent takedown. Radiation changes within the pelvis without definite residual disease. No distant metastases identified.  CT abdomen/pelvis 07/29/2015-inflammatory change involving the rectum with extraluminal air  CT of the chest 07/31/2015-no adenopathy, no nodules  Surveillance colonoscopy 03/03/2017-rectal stricture.  The entire examined colon appeared normal.  Next colonoscopy in 5 years for surveillance. 2. "Rheumatoid" arthritis. 3. Tobacco use. 4. Trachea/bronchial nodularity noted on the chest CT 02/24/2013. 5. History of hand-foot syndrome with dryness over the palms. 6. Ileostomy reversal 11/18/2013. 7. 8 mm low-density lesion right thyroid identified on chest CT 02/23/2014. 8. Coloanal anastomosis disruption December 2016-takedown of coloanal anastomosis, colostomy creation 08/01/2015, pathology negative for malignancy     Disposition: Angela Schmidt remains in clinical remission from rectal cancer.  We will follow-up on the CEA from today.  She will return for an office visit and CEA in 6 months.  She continues to smoke.  We are referring her to the lung cancer screening program.    Ned Card ANP/GNP-BC   09/25/2017  9:40 AM

## 2017-09-25 NOTE — Telephone Encounter (Signed)
Gave avs and calendar for august  °

## 2017-09-30 ENCOUNTER — Telehealth: Payer: Self-pay

## 2017-09-30 NOTE — Telephone Encounter (Signed)
Left VM for call back to give lab results.

## 2017-10-02 ENCOUNTER — Encounter: Payer: Self-pay | Admitting: *Deleted

## 2017-10-02 DIAGNOSIS — F17200 Nicotine dependence, unspecified, uncomplicated: Secondary | ICD-10-CM

## 2017-10-02 NOTE — Progress Notes (Signed)
Oncology Nurse Navigator Documentation  Oncology Nurse Navigator Flowsheets 10/02/2017  Navigator Location [facility]  Navigator Encounter Type Other/I completed lung cancer screening referral per order. I will update screening navigator of referral.   Treatment Phase Follow-up  Barriers/Navigation Needs Coordination of Care  Interventions Coordination of Care  Coordination of Care Other  Acuity Level 2  Acuity Level 2 Referrals such as genetics, survivorship  Time Spent with Patient 30

## 2017-10-29 ENCOUNTER — Encounter: Payer: Self-pay | Admitting: Family Medicine

## 2017-10-29 ENCOUNTER — Other Ambulatory Visit: Payer: Self-pay | Admitting: Family Medicine

## 2017-10-29 DIAGNOSIS — M19042 Primary osteoarthritis, left hand: Secondary | ICD-10-CM

## 2017-10-29 DIAGNOSIS — C20 Malignant neoplasm of rectum: Secondary | ICD-10-CM

## 2017-10-29 DIAGNOSIS — M19041 Primary osteoarthritis, right hand: Secondary | ICD-10-CM

## 2017-10-29 MED ORDER — ONDANSETRON HCL 8 MG PO TABS: 8.0000 mg | ORAL_TABLET | Freq: Three times a day (TID) | ORAL | 1 refills | Status: DC | PRN

## 2017-10-29 NOTE — Telephone Encounter (Signed)
She gets 60 Tramadol every 30 days, checked NCCSR, no unexpected entries.  Ok to refill

## 2017-11-21 ENCOUNTER — Encounter: Payer: Self-pay | Admitting: Acute Care

## 2018-01-23 ENCOUNTER — Encounter: Payer: Self-pay | Admitting: Family Medicine

## 2018-01-23 DIAGNOSIS — M19042 Primary osteoarthritis, left hand: Secondary | ICD-10-CM

## 2018-01-23 DIAGNOSIS — M19041 Primary osteoarthritis, right hand: Secondary | ICD-10-CM

## 2018-01-26 ENCOUNTER — Other Ambulatory Visit: Payer: Self-pay | Admitting: Family Medicine

## 2018-01-26 DIAGNOSIS — M19041 Primary osteoarthritis, right hand: Secondary | ICD-10-CM

## 2018-01-26 DIAGNOSIS — M19042 Primary osteoarthritis, left hand: Secondary | ICD-10-CM

## 2018-01-26 MED ORDER — TRAMADOL HCL 50 MG PO TABS: 50.0000 mg | ORAL_TABLET | Freq: Four times a day (QID) | ORAL | 0 refills | Status: DC | PRN

## 2018-01-26 NOTE — Telephone Encounter (Signed)
I have sent rx request for tramadol to you in rx request folder through epic. Please advise.

## 2018-01-26 NOTE — Telephone Encounter (Signed)
Last seen here in November so she will need to come in She does have OA mostly in her hands, back and neck. She uses tramadol prn for this- has been getting from her rheum Dr. Pearla Dubonnet wonders if we could rx this for her. She takes this once a day during the week and BID on the weekend when she is more active  NCCSR;  12/26/2017  1  10/29/2017  Tramadol Hcl 50 Mg Tablet  60 15 Je Cop  [id number]  Wal ([id number])  2 20.00 MME Comm Ins  [place]  11/28/2017  1  10/29/2017  Tramadol Hcl 50 Mg Tablet  60 15 Je Cop  [id number]  Wal ([id number])  1 20.00 MME Comm Ins  [place]  10/29/2017  1  10/29/2017  Tramadol Hcl 50 Mg Tablet  60 15 Je Cop  [id number]  Wal ([id number])  0 20.00 MME Comm Ins  [place]  09/30/2017  1  07/31/2017  Tramadol Hcl 50 Mg Tablet  60 15 Je Cop  [id number]  Wal ([id number])  2 20.00 MME Comm Ins  [place]  08/30/2017  1  07/31/2017  Tramadol Hcl 50 Mg Tablet  60 15 Je Cop  [id number]  Wal ([id number])  1 20.00 MME Comm Ins    07/31/2017  1  07/31/2017  Tramadol Hcl 50 Mg Tablet  60 15 Je Cop  [id number]  Wal (1438)  0      Will refill but ask her to come in for a visit

## 2018-01-26 NOTE — Telephone Encounter (Signed)
Requesting:Tramadol Contract:none, needs csc TXH:FSFS, needs uds Last Visit:06/30/17 Next Visit:04/22/18 Last Refill:10/29/17 2-refills  Please Advise

## 2018-01-26 NOTE — Addendum Note (Signed)
Addended by: Lamar Blinks C on: 01/26/2018 05:22 PM   Modules accepted: Orders

## 2018-01-27 ENCOUNTER — Telehealth: Payer: Self-pay | Admitting: Emergency Medicine

## 2018-01-27 NOTE — Telephone Encounter (Signed)
Fax received from [facility] stating that pt has not been able to be reached by their office regarding lunch cancer screening program. Referral cancelled . Called pt to let her know to contact [place]'s office to schedule appt and we will put in new referral.

## 2018-02-06 ENCOUNTER — Telehealth: Payer: Self-pay

## 2018-02-06 NOTE — Telephone Encounter (Signed)
Relayed message to pt. Pt voiced understanding and stated that she has been devoting her time to her terminally ill mother so has no time for the appt, but that she has already been in contact with their office. This RN voiced understanding     Hughie Closs, LPN   03/06/93 1:29 PM   Note    Fax received from [facility] stating that pt has not been able to be reached by their office regarding lunch cancer screening program. Referral cancelled . Called pt to let her know to contact [place]'s office to schedule appt and we will put in new referral.

## 2018-02-10 NOTE — Progress Notes (Signed)
[place] at [facility] [address] [place], [place] [address] [phone]  Date:  02/11/2018   Name:  Angela Schmidt   DOB:  1960/10/13   MRN:  [id number]  PCP:  Darreld Mclean, MD    Chief Complaint: Rectal Cancer (6 month follow up)   History of Present Illness:  Angela Schmidt is a 57 y.o. very pleasant female patient who presents with the following:  6 month follow-up visit History of rectal cancer/ colostomy 2014- she wears a permanent ostomy bag.   She also has osteoporosis and arthritis treated with tramadol  NCCSR: 01/26/2018  1  01/26/2018  Tramadol Hcl 50 Mg Tablet  60 15 Je Cop  [id number]  Wal ([id number])  0 20.00 MME Comm Ins  [place]  12/26/2017  1  10/29/2017  Tramadol Hcl 50 Mg Tablet  60 15 Je Cop  [id number]  Wal ([id number])  2 20.00 MME Comm Ins  [place]  11/28/2017  1  10/29/2017  Tramadol Hcl 50 Mg Tablet  60 15 Je Cop  [id number]  Wal ([id number])  1 20.00 MME Comm Ins  [place]  10/29/2017  1  10/29/2017  Tramadol Hcl 50 Mg Tablet  60 15 Je Cop  [id number]  Wal (1438)  0 20.00 MME Comm Ins  [place]  09/30/2017  1  07/31/2017  Tramadol Hcl 50 Mg Tablet  60 15 Je Cop  [id number]  Wal (1438)  2      Will have her do a CS contract today as well  mammo is due- she will have this done asap, will schedule today Dexa: 10/18- osteoporosis. She is tolerating her fosamax ok, no SE noted Can do full labs today if she likes- last labs done in October/ November - however she plans to see me in December and we will do labs then   She will be going to [facility] in July- her only concern is going through TSA with her colostomy bag.  This is the first time she has flown with her ostomy bag.  She will be traveling with her 3 daughters  She has noted some right leg pain for the last 10 months or so-  Seems to occur after she walks briskly for 30+ minutes She has tried stretching her piriformis muscle  Pain will start in her right upper leg and then go into her calf.   Occurs only with exercise.  Resolves with rest- after sitting or walking more slowly for about 5- 10 min she feels fine She notes this mostly when she walks for a certain amount of time ONLY on the right side, the left is fine.   The leg may feel weak when she has the pain only, but not otherwise. No numbness She does not have any sciatic type pain with driving or sitting   Tramadol helps with her back and other joint pain She gets 60 pills every month or so No other controlled substances used    Patient Active Problem List   Diagnosis Date Noted  . Osteoporosis 06/17/2017  . Vitamin D insufficiency 04/19/2016  . Tobacco use disorder 04/19/2016  . Absolute anemia 04/19/2016  . Breast cancer screening, high risk patient 04/19/2016  . Chronic back pain 04/19/2016  . Colostomy in place [facility]) 04/19/2016  . Rectal stenosis 04/04/2015  . Right thyroid nodule 03/07/2014  . Slow transit constipation 03/07/2014  . Personal history of malignant neoplasm of rectum 2014 02/21/2014  .  Rectal cancer ([place]) 02/25/2013    Past Medical History:  Diagnosis Date  . Arthritis    LUMBAR  . Cancer of rectosigmoid (colon) ([place]) stage III (uT3  uN1)  radiation complete 04-29-2013  currently on oral chemo med---  oncologist-- dr Benay Spice   06-14-2013  s/p anterior resection w/ Diverting ileostomy---  . Complication of anesthesia    "shivers"  . History of kidney stones   . Rectal stenosis   . S/P ileostomy ([place])   . Wears contact lenses     Past Surgical History:  Procedure Laterality Date  . BOWEL RESECTION N/A 08/01/2015   Procedure: SMALL BOWEL RESECTION, takedown of colorectal anastomosis with colostomy;  Surgeon: Leighton Ruff, MD;  Location: [facility];  Service: General;  Laterality: N/A;  . BUNIONECTOMY Left 1984  . COLOSTOMY    . COLOSTOMY     [place] Colostomy  . COLOSTOMY REVERSAL N/A 11/18/2013   Procedure: LOOP ILEOSTOMY CLOSURE, FLEXIBLE SIGMOIDOSCOPY;  Surgeon: Leighton Ruff, MD;   Location: [facility];  Service: General;  Laterality: N/A;  . EUS N/A 02/25/2013   Procedure: LOWER ENDOSCOPIC ULTRASOUND (EUS);  Surgeon: Milus Banister, MD;  Location: [facility];  Service: Endoscopy;  Laterality: N/A;  radial  . EXAMINATION UNDER ANESTHESIA N/A 07/21/2013   Procedure: EXAM UNDER ANESTHESIA/ RECTAL DILITATION;  Surgeon: Leighton Ruff, MD;  Location: [facility];  Service: General;  Laterality: N/A;  . FLEXIBLE SIGMOIDOSCOPY N/A 07/21/2013   Procedure: FLEXIBLE SIGMOIDOSCOPY;  Surgeon: Leighton Ruff, MD;  Location: [facility];  Service: General;  Laterality: N/A;  . FLEXIBLE SIGMOIDOSCOPY N/A 11/18/2013   Procedure: FLEXIBLE SIGMOIDOSCOPY;  Surgeon: Leighton Ruff, MD;  Location: [facility];  Service: General;  Laterality: N/A;  . FLEXIBLE SIGMOIDOSCOPY N/A 08/01/2015   Procedure: FLEXIBLE SIGMOIDOSCOPY with biopsies;  Surgeon: Leighton Ruff, MD;  Location: [facility];  Service: General;  Laterality: N/A;  . LAPAROSCOPIC LOW ANTERIOR RESECTION N/A 06/14/2013   Procedure: LAPAROSCOPIC LOW ANTERIOR RESECTION AND DIVERTING LAPAROSCOPIC ILEOSTOMY;  Surgeon: Leighton Ruff, MD;  Location: [facility];  Service: General;  Laterality: N/A;  with splenic flexure takdown  . LAPAROSCOPY N/A 08/01/2015   Procedure: LAPAROSCOPY ;  Surgeon: Leighton Ruff, MD;  Location: [facility];  Service: General;  Laterality: N/A;  . TUBAL LIGATION  1994    Social History   Tobacco Use  . Smoking status: Current Some Day Smoker    Packs/day: 0.50    Years: 30.00    Pack years: 15.00    Types: Cigarettes  . Smokeless tobacco: Never Used  . Tobacco comment: 9 cigarettes/ per day. cut back from 2packs per day   Substance Use Topics  . Alcohol use: No    Alcohol/week: 0.0 oz  . Drug use: No    Family History  Problem Relation Age of Onset  . Arthritis Mother   . Diabetes Mother   . Thyroid disease Mother   . Arthritis Father   . Diabetes Father   . Parkinson's disease Father   .  Colon cancer Neg Hx   . Esophageal cancer Neg Hx   . Pancreatic cancer Neg Hx   . Rectal cancer Neg Hx   . Stomach cancer Neg Hx     Allergies  Allergen Reactions  . Demerol [Meperidine] Nausea Only    Medication list has been reviewed and updated.  Current Outpatient Medications on File Prior to Visit  Medication Sig Dispense Refill  . alendronate (FOSAMAX) 70 MG tablet Take 1 tablet (  70 mg total) by mouth every 7 (seven) days. Take with a full glass of water on an empty stomach. 4 tablet 11  . cholecalciferol (VITAMIN D) 1000 units tablet Take 1,000 Units by mouth daily.    Marland Kitchen ibuprofen (ADVIL,MOTRIN) 200 MG tablet Take 400 mg by mouth every 6 (six) hours as needed (Pain). Reported on 02/12/2016    . Melatonin 5 MG TABS Take by mouth.    . nicotine (NICODERM CQ - DOSED IN MG/24 HOURS) 21 mg/24hr patch Place 1 patch onto the skin daily.    . ondansetron (ZOFRAN) 8 MG tablet Take 1 tablet (8 mg total) by mouth every 8 (eight) hours as needed. for nausea 30 tablet 1  . OVER THE COUNTER MEDICATION Take 1 capsule by mouth daily. Biotin  tablet one a day - hair, skin nails    . traMADol (ULTRAM) 50 MG tablet Take 1 tablet (50 mg total) by mouth every 6 (six) hours as needed. for pain 60 tablet 0  . vitamin B-12 (CYANOCOBALAMIN) 500 MCG tablet Take 500 mcg by mouth daily.     No current facility-administered medications on file prior to visit.     Review of Systems:  As per HPI- otherwise negative. No fever or chills No CP or SOB No rashes    Physical Examination: Vitals:   02/11/18 0829  BP: 120/68  Pulse: 95  Resp: 16  Temp: 98 F (36.7 C)  SpO2: 98%   Vitals:   02/11/18 0829  Weight: 119 lb (54 kg)  Height: 5\' 3"  (1.6 m)   Body mass index is 21.08 kg/m. Ideal Body Weight: Weight in (lb) to have BMI = 25: 140.8  GEN: WDWN, NAD, Non-toxic, A & O x 3, looks well, normal weight  HEENT: Atraumatic, Normocephalic. Neck supple. No masses, No LAD.  Bilateral TM wnl,  oropharynx normal.  PEERL,EOMI.   Ears and Nose: No external deformity. CV: RRR, No M/G/R. No JVD. No thrill. No extra heart sounds. PULM: CTA B, no wheezes, crackles, rhonchi. No retractions. No resp. distress. No accessory muscle use. ABD: S, NT, ND, +BS. No rebound. No HSM.  Ostomy bag in place. Looks fine EXTR: No c/c/e NEURO Normal gait.  PSYCH: Normally interactive. Conversant. Not depressed or anxious appearing.  Calm demeanor.  Normal DP pulses in both feet. No ulceration or gangrene, normal skin temperature Right hip seems normal to exam- no pain over greater trochanter, normal ROM   Assessment and Plan: Primary osteoarthritis of both hands  Rectal cancer ([facility])  Claudication of lower extremity ([place]) - Plan: VAS [place] ABI WITH/WO TBI  Chronic pain of OA treated with tramadol- signed CSC today - ok to refill when she needs more Given a note for TSA regarding her ostomy bag Possible claudication in her right leg- will obtain ABIs for her asap and proceed accordingly  Offered to do labs today- she would rather do when she sees me next, plan to meet in December.  Reminded to be fasting then if possible   Signed Lamar Blinks, MD

## 2018-02-11 ENCOUNTER — Ambulatory Visit: Payer: BLUE CROSS/BLUE SHIELD | Admitting: Family Medicine

## 2018-02-11 ENCOUNTER — Other Ambulatory Visit: Payer: Self-pay | Admitting: Family Medicine

## 2018-02-11 ENCOUNTER — Encounter: Payer: Self-pay | Admitting: Family Medicine

## 2018-02-11 ENCOUNTER — Ambulatory Visit (HOSPITAL_BASED_OUTPATIENT_CLINIC_OR_DEPARTMENT_OTHER)
Admission: RE | Admit: 2018-02-11 | Discharge: 2018-02-11 | Disposition: A | Discharge status: Home / Self Care | Payer: BLUE CROSS/BLUE SHIELD | Source: Ambulatory Visit | Attending: Family Medicine | Admitting: Family Medicine

## 2018-02-11 VITALS — BP 120/68 | HR 95 | Temp 98.0°F | Resp 16 | Ht 63.0 in | Wt 119.0 lb

## 2018-02-11 DIAGNOSIS — I739 Peripheral vascular disease, unspecified: Secondary | ICD-10-CM | POA: Diagnosis not present

## 2018-02-11 DIAGNOSIS — M19042 Primary osteoarthritis, left hand: Secondary | ICD-10-CM

## 2018-02-11 DIAGNOSIS — M19041 Primary osteoarthritis, right hand: Secondary | ICD-10-CM

## 2018-02-11 DIAGNOSIS — Z1231 Encounter for screening mammogram for malignant neoplasm of breast: Secondary | ICD-10-CM | POA: Insufficient documentation

## 2018-02-11 DIAGNOSIS — C20 Malignant neoplasm of rectum: Secondary | ICD-10-CM

## 2018-02-11 NOTE — Patient Instructions (Signed)
It was good to see you today- have a wonderful trip to [place]!   We are going to do a test for you to check for signs of claudication (inadequate blood flow to the leg) as a possible cause of your pain.  I will arrange this for you Please come and see me in December for a recheck and FASTING labs.  Take care!

## 2018-02-12 ENCOUNTER — Ambulatory Visit (HOSPITAL_BASED_OUTPATIENT_CLINIC_OR_DEPARTMENT_OTHER)
Admission: RE | Admit: 2018-02-12 | Discharge: 2018-02-12 | Disposition: A | Discharge status: Home / Self Care | Payer: BLUE CROSS/BLUE SHIELD | Source: Ambulatory Visit | Attending: Family Medicine | Admitting: Family Medicine

## 2018-02-12 DIAGNOSIS — Z1231 Encounter for screening mammogram for malignant neoplasm of breast: Secondary | ICD-10-CM | POA: Diagnosis present

## 2018-02-16 ENCOUNTER — Ambulatory Visit (HOSPITAL_COMMUNITY)
Admission: RE | Admit: 2018-02-16 | Discharge: 2018-02-16 | Disposition: A | Discharge status: Home / Self Care | Payer: BLUE CROSS/BLUE SHIELD | Source: Ambulatory Visit | Attending: Surgery | Admitting: Surgery

## 2018-02-16 DIAGNOSIS — R1031 Right lower quadrant pain: Secondary | ICD-10-CM | POA: Diagnosis not present

## 2018-02-16 DIAGNOSIS — I739 Peripheral vascular disease, unspecified: Secondary | ICD-10-CM

## 2018-02-16 DIAGNOSIS — F17200 Nicotine dependence, unspecified, uncomplicated: Secondary | ICD-10-CM | POA: Insufficient documentation

## 2018-02-16 DIAGNOSIS — M79604 Pain in right leg: Secondary | ICD-10-CM | POA: Diagnosis present

## 2018-02-17 ENCOUNTER — Other Ambulatory Visit: Payer: Self-pay | Admitting: Family Medicine

## 2018-02-17 ENCOUNTER — Encounter: Payer: Self-pay | Admitting: Family Medicine

## 2018-02-17 DIAGNOSIS — M19041 Primary osteoarthritis, right hand: Secondary | ICD-10-CM

## 2018-02-17 DIAGNOSIS — M19042 Primary osteoarthritis, left hand: Secondary | ICD-10-CM

## 2018-02-17 DIAGNOSIS — I739 Peripheral vascular disease, unspecified: Secondary | ICD-10-CM

## 2018-02-17 NOTE — Progress Notes (Signed)
Received her ABI report as follows: Final Interpretation: Bilateral pre-exercise: Resting ankle-brachial indeces are within normal range at rest.  Patent walked at a brisk pace for six minutes in the hall way and developed right groin pain / leg within four minutes.  Post exercise: The ankle-brachial index on the right dropped to 0.56 suggestive of moderate arterial disease immediately post exercise and returned to withing normal range withing three minutes of rest. The left ABI remained within the normal range pre  and post exercise  Message to pt- will refer to vascular surgery for their opinion about her situation  JC

## 2018-02-24 MED ORDER — TRAMADOL HCL 50 MG PO TABS: 50.0000 mg | ORAL_TABLET | Freq: Four times a day (QID) | ORAL | 3 refills | Status: DC | PRN

## 2018-03-06 ENCOUNTER — Encounter: Payer: BLUE CROSS/BLUE SHIELD | Admitting: Vascular Surgery

## 2018-03-19 ENCOUNTER — Encounter: Payer: Self-pay | Admitting: Family Medicine

## 2018-03-26 ENCOUNTER — Encounter: Payer: Self-pay | Admitting: Oncology

## 2018-03-26 ENCOUNTER — Encounter: Payer: Self-pay | Admitting: Vascular Surgery

## 2018-03-26 ENCOUNTER — Inpatient Hospital Stay: Payer: BLUE CROSS/BLUE SHIELD | Attending: Oncology | Admitting: Oncology

## 2018-03-26 ENCOUNTER — Other Ambulatory Visit: Payer: Self-pay

## 2018-03-26 ENCOUNTER — Ambulatory Visit: Payer: BLUE CROSS/BLUE SHIELD | Admitting: Vascular Surgery

## 2018-03-26 ENCOUNTER — Inpatient Hospital Stay: Payer: BLUE CROSS/BLUE SHIELD

## 2018-03-26 VITALS — BP 115/86 | HR 84 | Temp 97.8°F | Resp 18 | Ht 63.0 in | Wt 117.6 lb

## 2018-03-26 VITALS — BP 114/80 | HR 84 | Temp 97.2°F | Resp 16 | Ht 63.0 in | Wt 117.0 lb

## 2018-03-26 DIAGNOSIS — M069 Rheumatoid arthritis, unspecified: Secondary | ICD-10-CM | POA: Diagnosis not present

## 2018-03-26 DIAGNOSIS — Z85038 Personal history of other malignant neoplasm of large intestine: Secondary | ICD-10-CM | POA: Diagnosis present

## 2018-03-26 DIAGNOSIS — M79604 Pain in right leg: Secondary | ICD-10-CM

## 2018-03-26 DIAGNOSIS — F17210 Nicotine dependence, cigarettes, uncomplicated: Secondary | ICD-10-CM | POA: Diagnosis not present

## 2018-03-26 DIAGNOSIS — C20 Malignant neoplasm of rectum: Secondary | ICD-10-CM

## 2018-03-26 DIAGNOSIS — Z9221 Personal history of antineoplastic chemotherapy: Secondary | ICD-10-CM | POA: Diagnosis not present

## 2018-03-26 DIAGNOSIS — I739 Peripheral vascular disease, unspecified: Secondary | ICD-10-CM

## 2018-03-26 LAB — CEA (IN HOUSE-CHCC): CEA (CHCC-IN HOUSE): 3.12 ng/mL (ref 0.00–5.00)

## 2018-03-26 NOTE — Progress Notes (Signed)
[facility] OFFICE PROGRESS NOTE   Diagnosis: Rectal cancer  INTERVAL HISTORY:   Angela Schmidt returns as scheduled.  She feels well.  The colostomy is functioning well.  No bleeding.  Good appetite and energy level.  She reports discomfort in the right leg after walking 2 miles.  The discomfort resolves after resting for 10 minutes.  She is scheduled to see vascular surgery later today. She continues smoking.  She has not yet followed up with the lung cancer screening program.  She has been taking care of her ill mother.  Objective:  Vital signs in last 24 hours:  Last menstrual period 03/10/2013.    HEENT: Neck without mass Lymphatics: No cervical, supraclavicular, axillary, or inguinal nodes Resp: Distant breath sounds, scattered end inspiratory rhonchi, no respiratory distress Cardio: Regular rate and rhythm GI: No hepatospleno megaly, no mass, left lower quadrant colostomy Vascular: No leg edema Musculoskeletal: No pain with motion at the right hip, mild tenderness at the right posterior iliac    Lab Results:  Lab Results  Component Value Date   WBC 7.4 06/30/2017   HGB 15.1 (H) 06/30/2017   HCT 45.7 06/30/2017   MCV 95.6 06/30/2017   PLT 350.0 06/30/2017   NEUTROABS 4.8 04/19/2016    CMP  Lab Results  Component Value Date   NA 141 06/30/2017   K 4.9 07/02/2017   CL 106 06/30/2017   CO2 30 06/30/2017   GLUCOSE 90 06/30/2017   BUN 12 06/30/2017   CREATININE 0.87 06/30/2017   CALCIUM 10.3 06/30/2017   PROT 7.1 06/30/2017   ALBUMIN 3.9 06/30/2017   AST 16 06/30/2017   ALT 19 06/30/2017   ALKPHOS 81 06/30/2017   BILITOT 0.4 06/30/2017   GFRNONAA >60 08/05/2015   GFRAA >60 08/05/2015    Lab Results  Component Value Date   CEA1 3.42 09/25/2017    Medications: I have reviewed the patient's current medications.   Assessment/Plan: 1. Rectal cancer, clinical stage III (uT3 uN1) measured at 7 cm from the anal verge.  No evidence for  metastatic disease on CTs of the chest, abdomen and pelvis 02/24/2013.   Elevated CEA 02/22/2013 (13.6). Repeat CEA 08/26/2013 normal (1.1).   Initiation of neoadjuvant Xeloda and radiation on 03/22/2013, completed 04/29/2013.   Status post a low anterior resection and diverting ileostomy on 06/14/2013-ypT2,ypN0.   Cycle 1 adjuvant Xeloda beginning 07/16/2013.   Cycle 2 adjuvant Xeloda beginning 08/06/2013.   Cycle 3 adjuvant Xeloda beginning 08/27/2013.   Cycle 4 adjuvant Xeloda beginning 09/17/2013.   Cycle 5 adjuvant Xeloda beginning 10/08/2013.  Colonoscopy 02/21/2014. Evidence of prior surgical anastomosis in the anal canal, moderate stenosis and a small opening in the mucosa of unclear significance if any. No signs of cancer/polyps. Colon mucosa otherwise normal. Repeat colonoscopy recommended in 3 years.  02/23/2014 CEA 1.5.  CT chest/abdomen/pelvis 02/23/2014. Postsurgical changes related to the low anterior resection, diverting ileostomy and subsequent takedown. Radiation changes within the pelvis without definite residual disease. No distant metastases identified.  CT abdomen/pelvis 07/29/2015-inflammatory change involving the rectum with extraluminal air  CT of the chest 07/31/2015-no adenopathy, no nodules  Surveillance colonoscopy 03/03/2017-rectal stricture.  The entire examined colon appeared normal.  Next colonoscopy in 5 years for surveillance. 2. "Rheumatoid" arthritis. 3. Tobacco use. 4. Trachea/bronchial nodularity noted on the chest CT 02/24/2013. 5. History of hand-foot syndrome with dryness over the palms. 6. Ileostomy reversal 11/18/2013. 7. 8 mm low-density lesion right thyroid identified on chest CT 02/23/2014. 8. Coloanal  anastomosis disruption December 2016-takedown of coloanal anastomosis, colostomy creation 08/01/2015, pathology negative for malignancy    Disposition: Angela Schmidt remains in clinical remission from rectal cancer.  She is now  greater than 5 years out from diagnosis.  We will follow-up on the CEA from today.  She has a good prognosis for a long-term disease-free survival.  Angela Schmidt plans to continue clinical follow-up with Dr. Lorelei Pont.  She will continue surveillance colonoscopies with Dr. Carlean Purl.  Angela Schmidt will schedule an appointment with the lung cancer screening program.  She is not scheduled for a follow-up appointment in the medical oncology clinic.  We are available to see her in the future as needed.  Betsy Coder, MD  03/26/2018  8:10 AM

## 2018-03-26 NOTE — Progress Notes (Signed)
Referring Physician: Dr Lorelei Pont  Patient name: Angela Schmidt MRN: [id number] DOB: 05/10/1961 Sex: female  REASON FOR CONSULT: Right leg pain HPI: Angela Schmidt is a 57 y.o. female, with an 8 to 1-month history of pain in her right leg that occurs after walking about 1-1/2 miles.  The pain begins in her right hip and buttocks.  It then radiates down the back of her right leg and into her calf.  After stopping for a few minutes the pain completely subsides and she can go again but the pain is fairly reproducible at 1-1/2 miles.  She denies rest pain in the foot.  She denies nonhealing wounds. She does currently smoke about 5 to 6 cigarettes/day.  Greater than 3 minutes today spent regarding smoking cessation counseling.  Other medical problems include prior history of colon cancer with a permanent left lower quadrant ostomy.  She has recently been declared disease-free.  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  These are all currently stable.  She is currently not on aspirin or statin.  Past Medical History:  Diagnosis Date  . Arthritis    LUMBAR  . 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 Complication of anesthesia    "shivers"  . History of kidney stones   . Rectal stenosis   . S/P ileostomy ([facility])   . Wears contact lenses    Past Surgical History:  Procedure Laterality Date  . BOWEL RESECTION N/A 08/01/2015   Procedure: SMALL BOWEL RESECTION, takedown of colorectal anastomosis with colostomy;  Surgeon: Leighton Ruff, MD;  [facility];  Service: General;  Laterality: N/A;  . BUNIONECTOMY Left 1984  . COLOSTOMY    . COLOSTOMY     Permenant Colostomy  . COLOSTOMY REVERSAL N/A 11/18/2013   Procedure: LOOP ILEOSTOMY CLOSURE, FLEXIBLE SIGMOIDOSCOPY;  Surgeon: Leighton Ruff, MD;  [facility];  Service: General;  Laterality: N/A;  . EUS N/A 02/25/2013   Procedure: LOWER ENDOSCOPIC ULTRASOUND (EUS);  Surgeon: Milus Banister, MD;  Location: [facility];  Service: Endoscopy;  Laterality: N/A;  radial  . EXAMINATION UNDER ANESTHESIA N/A 07/21/2013   Procedure: EXAM UNDER ANESTHESIA/ RECTAL DILITATION;  Surgeon: Leighton Ruff, MD;  Location: [place];  Service: General;  Laterality: N/A;  . FLEXIBLE SIGMOIDOSCOPY N/A 07/21/2013   Procedure: FLEXIBLE SIGMOIDOSCOPY;  Surgeon: Leighton Ruff, MD;  Location: [place];  Service: General;  Laterality: N/A;  . FLEXIBLE SIGMOIDOSCOPY N/A 11/18/2013   Procedure: FLEXIBLE SIGMOIDOSCOPY;  Surgeon: Leighton Ruff, MD;  [facility];  Service: General;  Laterality: N/A;  . FLEXIBLE SIGMOIDOSCOPY N/A 08/01/2015   Procedure: FLEXIBLE SIGMOIDOSCOPY with biopsies;  Surgeon: Leighton Ruff, MD;  [facility];  Service: General;  Laterality: N/A;  . LAPAROSCOPIC LOW ANTERIOR RESECTION N/A 06/14/2013   Procedure: LAPAROSCOPIC LOW ANTERIOR RESECTION AND DIVERTING LAPAROSCOPIC ILEOSTOMY;  Surgeon: Leighton Ruff, MD;  [facility];  Service: General;  Laterality: N/A;  with splenic flexure takdown  . LAPAROSCOPY N/A 08/01/2015   Procedure: LAPAROSCOPY ;  Surgeon: Leighton Ruff, MD;  [facility];  Service: General;  Laterality: N/A;  . TUBAL LIGATION  1994    Family History  Problem Relation Age of Onset  . Arthritis Mother   . Diabetes Mother   . Thyroid disease Mother   .  Arthritis Father   . Diabetes Father   . Parkinson's disease Father   . Colon cancer Neg Hx   . Esophageal cancer Neg Hx   . Pancreatic cancer Neg Hx   . Rectal cancer Neg Hx   . Stomach cancer Neg Hx     SOCIAL HISTORY: Social History   Socioeconomic History  . Marital status: Divorced    Spouse name: Not on file  . Number of children: 3  . Years of education: Not on file  . Highest education level: Not on file    Occupational History  . Occupation: title closer  Social Needs  . Financial resource strain: Not on file  . Food insecurity:    Worry: Not on file    Inability: Not on file  . Transportation needs:    Medical: Not on file    Non-medical: Not on file  Tobacco Use  . Smoking status: Current Some Day Smoker    Packs/day: 0.50    Years: 30.00    Pack years: 15.00    Types: Cigarettes  . Smokeless tobacco: Never Used  . Tobacco comment: 9 cigarettes/ per day. cut back from 2packs per day   Substance and Sexual Activity  . Alcohol use: No    Alcohol/week: 0.0 standard drinks  . Drug use: No  . Sexual activity: Not on file  Lifestyle  . Physical activity:    Days per week: Not on file    Minutes per session: Not on file  . Stress: Not on file  Relationships  . Social connections:    Talks on phone: Not on file    Gets together: Not on file    Attends religious service: Not on file    Active member of club or organization: Not on file    Attends meetings of clubs or organizations: Not on file    Relationship status: Not on file  . Intimate partner violence:    Fear of current or ex partner: Not on file    Emotionally abused: Not on file    Physically abused: Not on file    Forced sexual activity: Not on file  Other Topics Concern  . Not on file  Social History Narrative  . Not on file    Allergies  Allergen Reactions  . Demerol [Meperidine] Nausea Only    Current Outpatient Medications  Medication Sig Dispense Refill  . alendronate (FOSAMAX) 70 MG tablet Take 1 tablet (70 mg total) by mouth every 7 (seven) days. Take with a full glass of water on an empty stomach. 4 tablet 11  . cholecalciferol (VITAMIN D) 1000 units tablet Take 1,000 Units by mouth daily.    Marland Kitchen ibuprofen (ADVIL,MOTRIN) 200 MG tablet Take 400 mg by mouth every 6 (six) hours as needed (Pain). Reported on 02/12/2016    . Melatonin 5 MG TABS Take by mouth.    . nicotine (NICODERM CQ - DOSED IN MG/24  HOURS) 21 mg/24hr patch Place 1 patch onto the skin daily.    . ondansetron (ZOFRAN) 8 MG tablet Take 1 tablet (8 mg total) by mouth every 8 (eight) hours as needed. for nausea 30 tablet 1  . OVER THE COUNTER MEDICATION Take 1 capsule by mouth daily. Biotin  tablet one a day - hair, skin nails    . traMADol (ULTRAM) 50 MG tablet Take 1 tablet (50 mg total) by mouth every 6 (six) hours as needed. for pain 60 tablet 3  . vitamin B-12 (CYANOCOBALAMIN)  500 MCG tablet Take 500 mcg by mouth daily.     No current facility-administered medications for this visit.     ROS:   General:  No weight loss, Fever, chills  HEENT: No recent headaches, no nasal bleeding, no visual changes, no sore throat  Neurologic: No dizziness, blackouts, seizures. No recent symptoms of stroke or mini- stroke. No recent episodes of slurred speech, or temporary blindness.  Cardiac: No recent episodes of chest pain/pressure, no shortness of breath at rest.  No shortness of breath with exertion.  Denies history of atrial fibrillation or irregular heartbeat  Vascular: No history of rest pain in feet.  No history of claudication.  No history of non-healing ulcer, No history of DVT   Pulmonary: No home oxygen, no productive cough, no hemoptysis,  No asthma or wheezing  Musculoskeletal:  [X]  Arthritis, [ ]  Low back pain,  [ ]  Joint pain  Hematologic:No history of hypercoagulable state.  No history of easy bleeding.  No history of anemia  Gastrointestinal: No hematochezia or melena,  No gastroesophageal reflux, no trouble swallowing  Urinary: [ ]  chronic Kidney disease, [ ]  on HD - [ ]  MWF or [ ]  TTHS, [ ]  Burning with urination, [ ]  Frequent urination, [ ]  Difficulty urinating;   Skin: No rashes  Psychological: No history of anxiety,  No history of depression   Physical Examination  Vitals:   03/26/18 1049  BP: 114/80  Pulse: 84  Resp: 16  Temp: (!) 97.2 F (36.2 C)  TempSrc: Oral  SpO2: 96%  Weight: 117 lb  (53.1 kg)  Height: 5\' 3"  (1.6 m)    Body mass index is 20.73 kg/m.  General:  Alert and oriented, no acute distress HEENT: Normal Neck: No bruit or JVD Pulmonary: Clear to auscultation bilaterally Cardiac: Regular Rate and Rhythm without murmur Abdomen: Soft, non-tender, non-distended, no mass, left lower quadrant ostomy Skin: No rash Extremity Pulses:  2+ radial, brachial, femoral, 2+ right dorsalis pedis pulse one plus right posterior tibial pulse absent left dorsalis pedis, posterior tibial pulses Musculoskeletal: No deformity or edema  Neurologic: Upper and lower extremity motor 5/5 and symmetric  DATA:  Patient had bilateral ABIs with exercise today.  ABIs at rest were normal and greater than 1.  With exercise the ABI on the right side dropped 2.5.  There was no left side drop with exercise.    ASSESSMENT: Right leg pain with symptoms consistent with claudication induced by exercise.  This also correlates with objective findings of her ABIs with exercise.  She currently is not wanting intervention and wishes to think about whether or not she would like to proceed.  I reassured her today that she is not currently at risk of limb loss.  I did discuss with her that some of her symptoms could certainly be related to arterial occlusive disease that is only manifested with walking.  Risk benefits possible complications and procedure details arteriogram possible intervention were discussed with patient today.   PLAN: The patient currently has opted for a walking program of 30 minutes daily and try to quit smoking.  She will call me if she wishes to consider an intervention in the near future.  Otherwise she will follow-up with our nurse practitioner with repeat exercise ABIs in 1 year.  I will leave it at the discretion of her primary care physician; however, she should potentially benefit from daily aspirin therapy and consideration for placement on a statin due to the fact that she  most  likely has underlying component of peripheral arterial disease.  Ruta Hinds, MD [facility] Office: [phone] Pager: [phone]

## 2018-03-31 ENCOUNTER — Telehealth: Payer: Self-pay

## 2018-03-31 NOTE — Telephone Encounter (Addendum)
LVM for pt to return call to clinic  ----- Message from Ladell Pier, MD sent at 03/27/2018  4:40 PM EDT ----- Please call patient, cea is normal

## 2018-04-22 ENCOUNTER — Encounter: Payer: BLUE CROSS/BLUE SHIELD | Admitting: Family Medicine

## 2018-05-15 ENCOUNTER — Encounter: Payer: Self-pay | Admitting: Family Medicine

## 2018-05-15 DIAGNOSIS — C20 Malignant neoplasm of rectum: Secondary | ICD-10-CM

## 2018-05-15 MED ORDER — ONDANSETRON HCL 8 MG PO TABS: 8.0000 mg | ORAL_TABLET | Freq: Three times a day (TID) | ORAL | 1 refills | Status: DC | PRN

## 2018-06-23 ENCOUNTER — Other Ambulatory Visit: Payer: Self-pay | Admitting: Family Medicine

## 2018-06-23 ENCOUNTER — Encounter: Payer: Self-pay | Admitting: Family Medicine

## 2018-06-23 DIAGNOSIS — M19041 Primary osteoarthritis, right hand: Secondary | ICD-10-CM

## 2018-06-23 DIAGNOSIS — M810 Age-related osteoporosis without current pathological fracture: Secondary | ICD-10-CM

## 2018-06-23 DIAGNOSIS — M19042 Primary osteoarthritis, left hand: Secondary | ICD-10-CM

## 2018-06-23 MED ORDER — ALENDRONATE SODIUM 70 MG PO TABS: 70.0000 mg | ORAL_TABLET | ORAL | 11 refills | Status: DC

## 2018-06-23 NOTE — Telephone Encounter (Signed)
Last Tramadol RX: 02/24/18, #60 x 3 refills Last OV: 03/26/17 Next OV: 08/05/18 UDS: not on file CSC: not on file CSR: I am unable to access controlled substance registry at this time. Please advise?

## 2018-06-23 NOTE — Telephone Encounter (Signed)
Reviewed [place]  05/25/2018  1   02/24/2018  Tramadol Hcl 50 Mg Tablet  60.00 15 Je Cop  [id number]  [facility] ([id number])  3/3 20.00 MME Comm Ins  [place]  04/24/2018  1   02/24/2018  Tramadol Hcl 50 Mg Tablet  60.00 15 Je Cop  [id number]  [facility] ([id number])  2/3 20.00 MME Comm Ins  [place]  03/26/2018  1   02/24/2018  Tramadol Hcl 50 Mg Tablet  60.00 15 Je Cop  [id number]  [facility] ([id number])  1/3 20.00 MME Comm Ins  [place]  02/24/2018  1   02/24/2018  Tramadol Hcl 50 Mg Tablet  60.00 15 Je Cop  [id number]  [facility] ([id number])  0/3 20.00 MME Comm Ins  [place]  01/26/2018  1   01/26/2018  Tramadol Hcl 50 Mg Tablet  60.00 15 Je Cop  [id number]  [facility] ([id number])  0/0 20.00 MME Comm Ins  [place]  12/26/2017  1   10/29/2017  Tramadol Hcl 50 Mg Tablet  60.00 15 Je Cop  [id number]  [facility] ([id number])  2/2 20.00 MME Comm Ins  [place]  11/28/2017  1   10/29/2017  Tramadol Hcl 50 Mg Tablet  60.00 15 Je Cop  [id number]  [facility] ([id number])  1/2 20.00 MME Comm Ins    10/29/2017  1   10/29/2017  Tramadol Hcl 50 Mg Tablet  60.00 15 Je Cop  [id number]  [facility] ([id number])  0/2

## 2018-08-01 NOTE — Progress Notes (Addendum)
[place] [facility] [address], [place], [place] [address] [phone]  Date:  08/05/2018   Name:  Angela Schmidt   DOB:  [birth year]   MRN:  [id number]  PCP:  Darreld Mclean, MD    Chief Complaint: Annual Exam (flu shot)    History of Present Illness:  Angela Schmidt is a 57 y.o. very pleasant female patient who presents with the following: I last saw her in June, at which time she had symptoms suspicious for claudication. Here for physical exam today.  History of osteoporosis, rectal cancer with colostomy, peripheral arterial disease recently seen by Dr. Oneida Alar.  She has claudication, and for the time being is using exercise program as treatment. Also history of chronic arthritis pain treated with tramadol She notes that her insurance program makes it hard for her to her to afford  She has stable symptoms in her legs She enjoys walking and yoga.   She is able to walk at least a 1/2 mile, sometimes much longer   Dr. Oneida Alar also suggested daily aspirin and a statin.  Pt was not aware of this but is happy to add these to her regimen  Mammo: 6/19 Dexa: 10/18 Colon: 2018 Labs: a year ago, she is not fasting, just had cereal Immun: she would like to have her flu shot Discussed shingrix- she would like to start this series  Pap: 2 years ago, negative HPV  Per last oncology note in August: Disposition: Angela Schmidt remains in clinical remission from rectal cancer.  She is now greater than 5 years out from diagnosis.  We will follow-up on the CEA from today.  She has a good prognosis for a long-term disease-free survival. Angela Schmidt plans to continue clinical follow-up with Dr. Lorelei Pont.  She will continue surveillance colonoscopies with Dr. Carlean Purl. Angela Schmidt will schedule an appointment with the lung cancer screening program.  Her mother did pass away in October.  She had suffered from kidney disease for a long time, but  has been really quite functional until a few days before her death.  She died quite peacefully under hospice care, and Angela Schmidt is at peace with her loss. Patient Active Problem List   Diagnosis Date Noted  . Osteoporosis 06/17/2017  . Vitamin D insufficiency 04/19/2016  . Tobacco use disorder 04/19/2016  . Absolute anemia 04/19/2016  . Breast cancer screening, high risk patient 04/19/2016  . Chronic back pain 04/19/2016  . Colostomy in place [facility]) 04/19/2016  . Rectal stenosis 04/04/2015  . Right thyroid nodule 03/07/2014  . Slow transit constipation 03/07/2014  . Personal history of malignant neoplasm of rectum 2014 02/21/2014  . Rectal cancer ([place]) 02/25/2013    Past Medical History:  Diagnosis Date  . Arthritis    LUMBAR  . 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 Complication of anesthesia    "shivers"  . History of kidney stones   . Rectal stenosis   . S/P ileostomy ([place])   . Wears contact lenses     Past Surgical History:  Procedure Laterality Date  . BOWEL RESECTION N/A 08/01/2015   Procedure: SMALL BOWEL RESECTION, takedown of colorectal anastomosis with colostomy;  Surgeon: Leighton Ruff, MD;  [facility];  Service: General;  Laterality: N/A;  . BUNIONECTOMY Left  [place]    . COLOSTOMY     Permenant Colostomy  . COLOSTOMY REVERSAL N/A 11/18/2013   Procedure: LOOP ILEOSTOMY CLOSURE, FLEXIBLE SIGMOIDOSCOPY;  Surgeon: Leighton Ruff, MD;  [facility];  Service: General;  Laterality: N/A;  . EUS N/A 02/25/2013   Procedure: LOWER ENDOSCOPIC ULTRASOUND (EUS);  Surgeon: Milus Banister, MD;  Location: [facility];  Service: Endoscopy;  Laterality: N/A;  radial  . EXAMINATION UNDER ANESTHESIA N/A 07/21/2013   Procedure: EXAM UNDER ANESTHESIA/ RECTAL DILITATION;  Surgeon: Leighton Ruff,  MD;  Location: [place];  Service: General;  Laterality: N/A;  . FLEXIBLE SIGMOIDOSCOPY N/A 07/21/2013   Procedure: FLEXIBLE SIGMOIDOSCOPY;  Surgeon: Leighton Ruff, MD;  Location: [facility];  Service: General;  Laterality: N/A;  . FLEXIBLE SIGMOIDOSCOPY N/A 11/18/2013   Procedure: FLEXIBLE SIGMOIDOSCOPY;  Surgeon: Leighton Ruff, MD;  [facility];  Service: General;  Laterality: N/A;  . FLEXIBLE SIGMOIDOSCOPY N/A 08/01/2015   Procedure: FLEXIBLE SIGMOIDOSCOPY with biopsies;  Surgeon: Leighton Ruff, MD;  [facility];  Service: General;  Laterality: N/A;  . LAPAROSCOPIC LOW ANTERIOR RESECTION N/A 06/14/2013   Procedure: LAPAROSCOPIC LOW ANTERIOR RESECTION AND DIVERTING LAPAROSCOPIC ILEOSTOMY;  Surgeon: Leighton Ruff, MD;  [facility];  Service: General;  Laterality: N/A;  with splenic flexure takdown  . LAPAROSCOPY N/A 08/01/2015   Procedure: LAPAROSCOPY ;  Surgeon: Leighton Ruff, MD;  [facility];  Service: General;  Laterality: N/A;  . TUBAL LIGATION  1994    Social History   Tobacco Use  . Smoking status: Current Some Day Smoker    Packs/day: 0.50    Years: 30.00    Pack years: 15.00    Types: Cigarettes  . Smokeless tobacco: Never Used  . Tobacco comment: 9 cigarettes/ per day. cut back from 2packs per day   Substance Use Topics  . Alcohol use: No    Alcohol/week: 0.0 standard drinks  . Drug use: No    Family History  Problem Relation Age of Onset  . Arthritis Mother   . Diabetes Mother   . Thyroid disease Mother   . Arthritis Father   . Diabetes Father   . Parkinson's disease Father   . Colon cancer Neg Hx   . Esophageal cancer Neg Hx   . Pancreatic cancer Neg Hx   . Rectal cancer Neg Hx   . Stomach cancer Neg Hx     Allergies  Allergen Reactions  . Demerol [Meperidine] Nausea Only    Medication list has been reviewed and updated.  Current Outpatient Medications on File Prior to Visit  Medication Sig Dispense  Refill  . alendronate (FOSAMAX) 70 MG tablet Take 1 tablet (70 mg total) by mouth every 7 (seven) days. Take with a full glass of water on an empty stomach. 4 tablet 11  . cholecalciferol (VITAMIN D) 1000 units tablet Take 1,000 Units by mouth daily.    Marland Kitchen ibuprofen (ADVIL,MOTRIN) 200 MG tablet Take 400 mg by mouth every 6 (six) hours as needed (Pain). Reported on 02/12/2016    . Melatonin 5 MG TABS Take by mouth.    . nicotine (NICODERM CQ - DOSED IN MG/24 HOURS) 21 mg/24hr patch Place 1 patch onto the skin daily.    . ondansetron (ZOFRAN) 8 MG tablet Take 1 tablet (8 mg total) by mouth every 8 (eight) hours as needed. for nausea 30 tablet 1  . OVER THE COUNTER MEDICATION Take 1 capsule by mouth daily. Biotin  tablet one a day - hair, skin nails    . traMADol (ULTRAM) 50 MG tablet TAKE 1 TABLET BY MOUTH EVERY 6 HOURS AS NEEDED FOR PAIN 60 tablet 3  . vitamin B-12 (CYANOCOBALAMIN) 500 MCG tablet Take 500 mcg by mouth daily.     No current facility-administered medications on file prior to visit.     Review of Systems:  As per HPI- otherwise negative. No chest pain or shortness of breath.  No postmenopausal bleeding. Despite the loss of her mom, Angela Schmidt feels like she is doing okay with her mood.  She is not taking the Wellbutrin right now as it caused adverse side effects.  She plans to try to quit smoking on her own.  Physical Examination: Vitals:   08/05/18 0821  BP: 112/80  Pulse: 87  Resp: 16  Temp: 97.9 F (36.6 C)  SpO2: 99%   Vitals:   08/05/18 0821  Weight: 119 lb (54 kg)  Height: _0  (1.6 m)   Body mass index is 21.08 kg/m. Ideal Body Weight: Weight in (lb) to have BMI = 25: 140.8  GEN: WDWN, NAD, Non-toxic, A & O x 3, slim build, looks well  HEENT: Atraumatic, Normocephalic. Neck supple. No masses, No LAD.  Bilateral TM wnl, oropharynx normal.  PEERL,EOMI.   Ears and Nose: No external deformity. CV: RRR, No M/G/R. No JVD. No thrill. No extra heart sounds. PULM:  CTA B, no wheezes, crackles, rhonchi. No retractions. No resp. distress. No accessory muscle use. ABD: S, NT, ND, +BS. No rebound. No HSM. EXTR: No c/c/e NEURO Normal gait.  PSYCH: Normally interactive. Conversant. Not depressed or anxious appearing.  Calm demeanor.  Breast: normal exam, no masses/ dimpling/ discharge  Assessment and Plan: Physical exam  Primary osteoarthritis of both hands  Rectal cancer (HCC)  Claudication of lower extremity (HCC) - Plan: lovastatin (MEVACOR) 20 MG tablet  Age-related osteoporosis without current pathological fracture  History of anemia - Plan: CBC  Screening for diabetes mellitus - Plan: Comprehensive metabolic panel, Hemoglobin A1c  Screening for hyperlipidemia - Plan: Lipid panel  Immunization due - Plan: Varicella-zoster vaccine IM (Shingrix), Flu Vaccine QUAD 6+ mos PF IM (Fluarix Quad PF)   Here today for physical exam, await labs and will be in touch with her. Continue to encourage her to quit smoking. She is now 5 years out from rectal cancer, and has been released by oncology. We discussed lung cancer screening, but she actually has not met the pack year requirement for this screening.  She plans to try nicotine patches  For her claudication, she will continue to work on exercise.  She is generally quite physically active we will continue this.  We also added lovastatin, and she will take a baby aspirin. We plan to recheck here in 6 months, and can do her second Shingrix at that time  Signed Lamar Blinks, MD  Received her labs, message to pt Your blood counts are normal except for a high red cell count.  This could increase your risk of it getting a blood clot, however your increase is not very severe.  Certainly add an aspirin as we had discussed.  This would also be a good time to donate blood, to help offload some of your extra red cells. Metabolic profile is normal, except your potassium is high again.  This elevation is less  severe than it was earlier this year.  However, we still need to follow up.  Please look up a list  of high potassium foods online, and avoid these foods as much as possible for the time being.  Please come in for a repeat potassium level by early next week. A1c looks okay, it is just at the bottom cutoff for pre-diabetes.  We will continue to monitor this. Your cholesterol is overall very good, and I calculated your estimated 10-year risk of cardiovascular disease at 3.3% Please come in for repeat potassium as a lab visit only, and otherwise we can plan to visit in about 6 months.  Please do call and schedule a visit with the lab prior to coming in.  Ordered BMP  Results for orders placed or performed in visit on 08/05/18  CBC  Result Value Ref Range   WBC 5.4 4.0 - 10.5 K/uL   RBC 4.95 3.87 - 5.11 Mil/uL   Platelets 330.0 150.0 - 400.0 K/uL   Hemoglobin 15.9 (H) 12.0 - 15.0 g/dL   HCT 46.7 (H) 36.0 - 46.0 %   MCV 94.2 78.0 - 100.0 fl   MCHC 34.0 30.0 - 36.0 g/dL   RDW 13.5 11.5 - 15.5 %  Comprehensive metabolic panel  Result Value Ref Range   Sodium 143 135 - 145 mEq/L   Potassium 5.5 (H) 3.5 - 5.1 mEq/L   Chloride 107 96 - 112 mEq/L   CO2 31 19 - 32 mEq/L   Glucose, Bld 71 70 - 99 mg/dL   BUN 11 6 - 23 mg/dL   Creatinine, Ser 0.83 0.40 - 1.20 mg/dL   Total Bilirubin 0.3 0.2 - 1.2 mg/dL   Alkaline Phosphatase 80 39 - 117 U/L   AST 13 0 - 37 U/L   ALT 12 0 - 35 U/L   Total Protein 6.9 6.0 - 8.3 g/dL   Albumin 4.1 3.5 - 5.2 g/dL   Calcium 10.2 8.4 - 10.5 mg/dL   GFR 75.39 >60.00 mL/min  Hemoglobin A1c  Result Value Ref Range   Hgb A1c MFr Bld 5.7 4.6 - 6.5 %  Lipid panel  Result Value Ref Range   Cholesterol 171 0 - 200 mg/dL   Triglycerides 168.0 (H) 0.0 - 149.0 mg/dL   HDL 56.80 >39.00 mg/dL   VLDL 33.6 0.0 - 40.0 mg/dL   LDL Cholesterol 81 0 - 99 mg/dL   Total CHOL/HDL Ratio 3    NonHDL 114.47     The 10-year ASCVD risk score Mikey Bussing DC Jr., et al., 2013) is: 3.3%    Values used to calculate the score:     Age: 40 years     Sex: Female     Is Non-Hispanic African American: No     Diabetic: No     Tobacco smoker: Yes     Systolic Blood Pressure: 403 mmHg     Is BP treated: No     HDL Cholesterol: 56.8 mg/dL     Total Cholesterol: 171 mg/dL

## 2018-08-05 ENCOUNTER — Ambulatory Visit (INDEPENDENT_AMBULATORY_CARE_PROVIDER_SITE_OTHER): Payer: BLUE CROSS/BLUE SHIELD | Admitting: Family Medicine

## 2018-08-05 ENCOUNTER — Encounter: Payer: Self-pay | Admitting: Family Medicine

## 2018-08-05 VITALS — BP 112/80 | HR 87 | Temp 97.9°F | Resp 16 | Ht 63.0 in | Wt 119.0 lb

## 2018-08-05 DIAGNOSIS — Z13220 Encounter for screening for lipoid disorders: Secondary | ICD-10-CM

## 2018-08-05 DIAGNOSIS — M810 Age-related osteoporosis without current pathological fracture: Secondary | ICD-10-CM

## 2018-08-05 DIAGNOSIS — Z0000 Encounter for general adult medical examination without abnormal findings: Secondary | ICD-10-CM

## 2018-08-05 DIAGNOSIS — M19041 Primary osteoarthritis, right hand: Secondary | ICD-10-CM | POA: Diagnosis not present

## 2018-08-05 DIAGNOSIS — I739 Peripheral vascular disease, unspecified: Secondary | ICD-10-CM

## 2018-08-05 DIAGNOSIS — C20 Malignant neoplasm of rectum: Secondary | ICD-10-CM

## 2018-08-05 DIAGNOSIS — Z23 Encounter for immunization: Secondary | ICD-10-CM

## 2018-08-05 DIAGNOSIS — Z131 Encounter for screening for diabetes mellitus: Secondary | ICD-10-CM

## 2018-08-05 DIAGNOSIS — Z862 Personal history of diseases of the blood and blood-forming organs and certain disorders involving the immune mechanism: Secondary | ICD-10-CM

## 2018-08-05 DIAGNOSIS — E875 Hyperkalemia: Secondary | ICD-10-CM

## 2018-08-05 DIAGNOSIS — M19042 Primary osteoarthritis, left hand: Secondary | ICD-10-CM

## 2018-08-05 LAB — HEMOGLOBIN A1C: HEMOGLOBIN A1C: 5.7 % (ref 4.6–6.5)

## 2018-08-05 LAB — LIPID PANEL
CHOLESTEROL: 171 mg/dL (ref 0–200)
HDL: 56.8 mg/dL (ref >39.00)
LDL Cholesterol: 81 mg/dL (ref 0–99)
NonHDL: 114.47
TRIGLYCERIDES: 168 mg/dL — AB (ref 0.0–149.0)
Total CHOL/HDL Ratio: 3
VLDL: 33.6 mg/dL (ref 0.0–40.0)

## 2018-08-05 LAB — COMPREHENSIVE METABOLIC PANEL
ALT: 12 U/L (ref 0–35)
AST: 13 U/L (ref 0–37)
Albumin: 4.1 g/dL (ref 3.5–5.2)
Alkaline Phosphatase: 80 U/L (ref 39–117)
BUN: 11 mg/dL (ref 6–23)
CALCIUM: 10.2 mg/dL (ref 8.4–10.5)
CHLORIDE: 107 meq/L (ref 96–112)
CO2: 31 mEq/L (ref 19–32)
CREATININE: 0.83 mg/dL (ref 0.40–1.20)
GFR: 75.39 mL/min (ref >60.00)
Glucose, Bld: 71 mg/dL (ref 70–99)
POTASSIUM: 5.5 meq/L — AB (ref 3.5–5.1)
Sodium: 143 mEq/L (ref 135–145)
Total Bilirubin: 0.3 mg/dL (ref 0.2–1.2)
Total Protein: 6.9 g/dL (ref 6.0–8.3)

## 2018-08-05 LAB — CBC
HEMATOCRIT: 46.7 % — AB (ref 36.0–46.0)
HEMOGLOBIN: 15.9 g/dL — AB (ref 12.0–15.0)
MCHC: 34 g/dL (ref 30.0–36.0)
MCV: 94.2 fl (ref 78.0–100.0)
PLATELETS: 330 10*3/uL (ref 150.0–400.0)
RBC: 4.95 Mil/uL (ref 3.87–5.11)
RDW: 13.5 % (ref 11.5–15.5)
WBC: 5.4 10*3/uL (ref 4.0–10.5)

## 2018-08-05 MED ORDER — LOVASTATIN 20 MG PO TABS: 20.0000 mg | ORAL_TABLET | Freq: Every day | ORAL | 3 refills | Status: DC

## 2018-08-05 NOTE — Patient Instructions (Addendum)
It was great to see you today, have a wonderful holiday season.  I will be in touch with your labs ASAP. I sent in a statin medication, called lovastatin.  Take this once a day.  This may help to decrease risk of worsening arterial disease in your legs.  Also a baby aspirin once a day may be helpful. We will plan for a Pap and a repeat bone density scan next year. Please make a nurse visit with [place] for your next shingles vaccine in 2 to 6 months.   Health Maintenance for Postmenopausal Women Menopause is a normal process in which your reproductive ability comes to an end. This process happens gradually over a span of months to years, usually between the ages of 23 and 89. Menopause is complete when you have missed 12 consecutive menstrual periods. It is important to talk with your health care provider about some of the most common conditions that affect postmenopausal women, such as heart disease, cancer, and bone loss (osteoporosis). Adopting a healthy lifestyle and getting preventive care can help to promote your health and wellness. Those actions can also lower your chances of developing some of these common conditions. What should I know about menopause? During menopause, you may experience a number of symptoms, such as:  Moderate-to-severe hot flashes.  Night sweats.  Decrease in sex drive.  Mood swings.  Headaches.  Tiredness.  Irritability.  Memory problems.  Insomnia. Choosing to treat or not to treat menopausal changes is an individual decision that you make with your health care provider. What should I know about hormone replacement therapy and supplements? Hormone therapy products are effective for treating symptoms that are associated with menopause, such as hot flashes and night sweats. Hormone replacement carries certain risks, especially as you become older. If you are thinking about using estrogen or estrogen with progestin treatments, discuss the benefits and risks with  your health care provider. What should I know about heart disease and stroke? Heart disease, heart attack, and stroke become more likely as you age. This may be due, in part, to the hormonal changes that your body experiences during menopause. These can affect how your body processes dietary fats, triglycerides, and cholesterol. Heart attack and stroke are both medical emergencies. There are many things that you can do to help prevent heart disease and stroke:  Have your blood pressure checked at least every 1-2 years. High blood pressure causes heart disease and increases the risk of stroke.  If you are 67-21 years old, ask your health care provider if you should take aspirin to prevent a heart attack or a stroke.  Do not use any tobacco products, including cigarettes, chewing tobacco, or electronic cigarettes. If you need help quitting, ask your health care provider.  It is important to eat a healthy diet and maintain a healthy weight. ? Be sure to include plenty of vegetables, fruits, low-fat dairy products, and lean protein. ? Avoid eating foods that are high in solid fats, added sugars, or salt (sodium).  Get regular exercise. This is one of the most important things that you can do for your health. ? Try to exercise for at least 150 minutes each week. The type of exercise that you do should increase your heart rate and make you sweat. This is known as moderate-intensity exercise. ? Try to do strengthening exercises at least twice each week. Do these in addition to the moderate-intensity exercise.  Know your numbers.Ask your health care provider to check your  cholesterol and your blood glucose. Continue to have your blood tested as directed by your health care provider.  What should I know about cancer screening? There are several types of cancer. Take the following steps to reduce your risk and to catch any cancer development as early as possible. Breast Cancer  Practice breast  self-awareness. ? This means understanding how your breasts normally appear and feel. ? It also means doing regular breast self-exams. Let your health care provider know about any changes, no matter how small.  If you are 29 or older, have a clinician do a breast exam (clinical breast exam or CBE) every year. Depending on your age, family history, and medical history, it may be recommended that you also have a yearly breast X-ray (mammogram).  If you have a family history of breast cancer, talk with your health care provider about genetic screening.  If you are at high risk for breast cancer, talk with your health care provider about having an MRI and a mammogram every year.  Breast cancer (BRCA) gene test is recommended for women who have family members with BRCA-related cancers. Results of the assessment will determine the need for genetic counseling and BRCA1 and for BRCA2 testing. BRCA-related cancers include these types: ? Breast. This occurs in males or females. ? Ovarian. ? Tubal. This may also be called fallopian tube cancer. ? Cancer of the abdominal or pelvic lining (peritoneal cancer). ? Prostate. ? Pancreatic. Cervical, Uterine, and Ovarian Cancer Your health care provider may recommend that you be screened regularly for cancer of the pelvic organs. These include your ovaries, uterus, and vagina. This screening involves a pelvic exam, which includes checking for microscopic changes to the surface of your cervix (Pap test).  For women ages 21-65, health care providers may recommend a pelvic exam and a Pap test every three years. For women ages 54-65, they may recommend the Pap test and pelvic exam, combined with testing for human papilloma virus (HPV), every five years. Some types of HPV increase your risk of cervical cancer. Testing for HPV may also be done on women of any age who have unclear Pap test results.  Other health care providers may not recommend any screening for  nonpregnant women who are considered low risk for pelvic cancer and have no symptoms. Ask your health care provider if a screening pelvic exam is right for you.  If you have had past treatment for cervical cancer or a condition that could lead to cancer, you need Pap tests and screening for cancer for at least 20 years after your treatment. If Pap tests have been discontinued for you, your risk factors (such as having a new sexual partner) need to be reassessed to determine if you should start having screenings again. Some women have medical problems that increase the chance of getting cervical cancer. In these cases, your health care provider may recommend that you have screening and Pap tests more often.  If you have a family history of uterine cancer or ovarian cancer, talk with your health care provider about genetic screening.  If you have vaginal bleeding after reaching menopause, tell your health care provider.  There are currently no reliable tests available to screen for ovarian cancer. Lung Cancer Lung cancer screening is recommended for adults 107-29 years old who are at high risk for lung cancer because of a history of smoking. A yearly low-dose CT scan of the lungs is recommended if you:  Currently smoke.  Have a history  of at least 30 pack-years of smoking and you currently smoke or have quit within the past 15 years. A pack-year is smoking an average of one pack of cigarettes per day for one year. Yearly screening should:  Continue until it has been 15 years since you quit.  Stop if you develop a health problem that would prevent you from having lung cancer treatment. Colorectal Cancer  This type of cancer can be detected and can often be prevented.  Routine colorectal cancer screening usually begins at age 25 and continues through age 71.  If you have risk factors for colon cancer, your health care provider may recommend that you be screened at an earlier age.  If you have  a family history of colorectal cancer, talk with your health care provider about genetic screening.  Your health care provider may also recommend using home test kits to check for hidden blood in your stool.  A small camera at the end of a tube can be used to examine your colon directly (sigmoidoscopy or colonoscopy). This is done to check for the earliest forms of colorectal cancer.  Direct examination of the colon should be repeated every 5-10 years until age 73. However, if early forms of precancerous polyps or small growths are found or if you have a family history or genetic risk for colorectal cancer, you may need to be screened more often. Skin Cancer  Check your skin from head to toe regularly.  Monitor any moles. Be sure to tell your health care provider: ? About any new moles or changes in moles, especially if there is a change in a mole's shape or color. ? If you have a mole that is larger than the size of a pencil eraser.  If any of your family members has a history of skin cancer, especially at a young age, talk with your health care provider about genetic screening.  Always use sunscreen. Apply sunscreen liberally and repeatedly throughout the day.  Whenever you are outside, protect yourself by wearing long sleeves, pants, a wide-brimmed hat, and sunglasses. What should I know about osteoporosis? Osteoporosis is a condition in which bone destruction happens more quickly than new bone creation. After menopause, you may be at an increased risk for osteoporosis. To help prevent osteoporosis or the bone fractures that can happen because of osteoporosis, the following is recommended:  If you are 28-38 years old, get at least 1,000 mg of calcium and at least 600 mg of vitamin D per day.  If you are older than age 87 but younger than age 60, get at least 1,200 mg of calcium and at least 600 mg of vitamin D per day.  If you are older than age 13, get at least 1,200 mg of calcium and  at least 800 mg of vitamin D per day. Smoking and excessive alcohol intake increase the risk of osteoporosis. Eat foods that are rich in calcium and vitamin D, and do weight-bearing exercises several times each week as directed by your health care provider. What should I know about how menopause affects my mental health? Depression may occur at any age, but it is more common as you become older. Common symptoms of depression include:  Low or sad mood.  Changes in sleep patterns.  Changes in appetite or eating patterns.  Feeling an overall lack of motivation or enjoyment of activities that you previously enjoyed.  Frequent crying spells. Talk with your health care provider if you think that you are  experiencing depression. What should I know about immunizations? It is important that you get and maintain your immunizations. These include:  Tetanus, diphtheria, and pertussis (Tdap) booster vaccine.  Influenza every year before the flu season begins.  Pneumonia vaccine.  Shingles vaccine. Your health care provider may also recommend other immunizations. This information is not intended to replace advice given to you by your health care provider. Make sure you discuss any questions you have with your health care provider. Document Released: 09/27/2005 Document Revised: 02/23/2016 Document Reviewed: 05/09/2015 Elsevier Interactive Patient Education  2019 [employer].

## 2018-08-05 NOTE — Addendum Note (Signed)
Addended by: Lamar Blinks C on: 08/05/2018 05:02 PM   Modules accepted: Orders

## 2018-08-19 ENCOUNTER — Other Ambulatory Visit: Payer: Self-pay | Admitting: Family Medicine

## 2018-08-20 ENCOUNTER — Encounter: Payer: Self-pay | Admitting: Family Medicine

## 2018-08-24 ENCOUNTER — Other Ambulatory Visit (INDEPENDENT_AMBULATORY_CARE_PROVIDER_SITE_OTHER): Payer: BLUE CROSS/BLUE SHIELD

## 2018-08-24 ENCOUNTER — Other Ambulatory Visit: Payer: BLUE CROSS/BLUE SHIELD

## 2018-08-24 ENCOUNTER — Encounter: Payer: Self-pay | Admitting: Family Medicine

## 2018-08-24 DIAGNOSIS — E875 Hyperkalemia: Secondary | ICD-10-CM

## 2018-08-24 LAB — POTASSIUM: Potassium: 4.9 mEq/L (ref 3.5–5.1)

## 2018-10-14 ENCOUNTER — Encounter: Payer: Self-pay | Admitting: Family Medicine

## 2018-10-14 DIAGNOSIS — M19042 Primary osteoarthritis, left hand: Secondary | ICD-10-CM

## 2018-10-14 DIAGNOSIS — M19041 Primary osteoarthritis, right hand: Secondary | ICD-10-CM

## 2018-10-15 MED ORDER — TRAMADOL HCL 50 MG PO TABS: 50.0000 mg | ORAL_TABLET | Freq: Four times a day (QID) | ORAL | 3 refills | Status: DC | PRN

## 2018-10-15 NOTE — Telephone Encounter (Signed)
Last seen here in December  09/18/2018  1   06/23/2018  Tramadol Hcl 50 MG Tablet  60.00 15 Je Cop   [id number]   [facility] (1438)   3  20.00 MME  Comm Ins   [place]  08/20/2018  1   06/23/2018  Tramadol Hcl 50 MG Tablet  60.00 15 Je Cop   [id number]   [facility] (1438)   2  20.00 MME  Comm Ins   [place]  07/22/2018  1   06/23/2018  Tramadol Hcl 50 MG Tablet  60.00 15 Je Cop   [id number]   [facility] (1438)   1  20.00 MME  Comm Ins   [place]  06/23/2018  1   06/23/2018  Tramadol Hcl 50 MG Tablet  60.00 15 Je Cop   [id number]   [facility] (1438)   0  20.00 MME  Comm Ins   [place]  05/25/2018  1   02/24/2018  Tramadol Hcl 50 MG Tablet  60.00 15 Je Cop   [id number]   [facility] (1438)   3  20.00 MME  Comm Ins   [place]  04/24/2018  1   02/24/2018  Tramadol Hcl 50 MG Tablet  60.00 15 Je Cop   [id number]   [facility] (1438)   2  20.00 MME  Comm Ins   [place]  03/26/2018  1   02/24/2018  Tramadol Hcl 50 MG Tablet  60.00 15 Je Cop   [id number]   [facility] (1438)   1  20.00 MME  Comm Ins   [place]  02/24/2018  1   02/24/2018  Tramadol Hcl 50 MG Tablet  60.00 15 Je Cop   [id number]   [facility] (1438)   0  20.00 MME  Comm Ins   [place]  01/26/2018  1   01/26/2018  Tramadol Hcl 50 MG Tablet  60.00 15 Je Cop   [id number]   [facility] (1438)   0  20.00 MME  Comm Ins   [place]  12/26/2017  1   10/29/2017  Tramadol Hcl 50 MG Tablet  60.00 15 Je Cop   [id number]   [facility] (1438)   2  20.00 MME  Comm Ins   [place]

## 2019-02-07 NOTE — Progress Notes (Addendum)
[place] at [facility] [address] [place], [place] [address] [phone]  Date:  02/08/2019   Name:  Angela Schmidt   DOB:  [birth year]   MRN:  [id number]  PCP:  Darreld Mclean, MD    Chief Complaint: Osteoporosis (6 month follow up) and Immunizations (2nd shingrix)   History of Present Illness:  Angela Schmidt is a 58 y.o. very pleasant female patient who presents with the following:  In person visit today for a 6 month follow-up   Last seen by myself for physical in December. Chriss has history of osteoporosis, rectal cancer with colostomy, peripheral arterial disease and claudication.  Also chronic arthritis pain treated with tramadol Her arthritis is manageable.  Her hands bother her the most- however she is more active with her hands recently doing some yard work, painting   She saw Dr. Oneida Alar for her peripheral arterial disease last year, he suggested she add aspirin and statin as well as continued exercise This is really not bothering her -no significant symptoms at this time She is doing some yoga which seems to help her  No CP or SOB with activity  Her oncologist is Dr. Benay Spice, last visit with him in August.  That was her 5 year follow-up, she was released from regular care. She will see him and have colon in 5 years   She is due for mammogram this month and needs her second Shingrix vaccine.  Otherwise health maintenance appears to be up-to-date She will set up her mammo, she will also set up her dexa for this fall -will let me know if she needs any orders placed   Most recent labs in December.  Her potassium was high at that time, but normalized on recheck Will need dexa this fall   Fosamax Vitamin D Lovastatin Tramadol as needed B12  She has smoked for about 20 years, less than 1 PPD. She is still smoking a few a day; maybe 7-8 per day.   Encouraged her to quit- however for now she does not qualify  for screening  ?do labs today - she prefers to wait until December  Will do pap today as this is due  She has not been sick at all during the pandemic   She caught her pinky toe on a chair last month and thinks she broke it- no longer painful but it was swollen and bruised at the time  Patient Active Problem List   Diagnosis Date Noted  . Osteoporosis 06/17/2017  . Vitamin D insufficiency 04/19/2016  . Tobacco use disorder 04/19/2016  . Absolute anemia 04/19/2016  . Breast cancer screening, high risk patient 04/19/2016  . Chronic back pain 04/19/2016  . Colostomy in place [facility]) 04/19/2016  . Rectal stenosis 04/04/2015  . Right thyroid nodule 03/07/2014  . Slow transit constipation 03/07/2014  . Personal history of malignant neoplasm of rectum 2014 02/21/2014  . Rectal cancer ([place]) 02/25/2013    Past Medical History:  Diagnosis Date  . Arthritis    LUMBAR  . 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 Complication of anesthesia    "shivers"  . History of kidney stones   . Rectal stenosis   . S/P ileostomy ([place])   . Wears contact lenses     Past  Surgical History:  Procedure Laterality Date  . BOWEL RESECTION N/A 08/01/2015   Procedure: SMALL BOWEL RESECTION, takedown of colorectal anastomosis with colostomy;  Surgeon: Leighton Ruff, MD;  Location: [facility];  Service: General;  Laterality: N/A;  . BUNIONECTOMY Left 1984  . COLOSTOMY    . COLOSTOMY     [place] Colostomy  . COLOSTOMY REVERSAL N/A 11/18/2013   Procedure: LOOP ILEOSTOMY CLOSURE, FLEXIBLE SIGMOIDOSCOPY;  Surgeon: Leighton Ruff, MD;  Location: [facility];  Service: General;  Laterality: N/A;  . EUS N/A 02/25/2013   Procedure: LOWER ENDOSCOPIC ULTRASOUND (EUS);  Surgeon: Milus Banister, MD;  Location: [facility];  Service: Endoscopy;  Laterality:  N/A;  radial  . EXAMINATION UNDER ANESTHESIA N/A 07/21/2013   Procedure: EXAM UNDER ANESTHESIA/ RECTAL DILITATION;  Surgeon: Leighton Ruff, MD;  Location: [facility];  Service: General;  Laterality: N/A;  . FLEXIBLE SIGMOIDOSCOPY N/A 07/21/2013   Procedure: FLEXIBLE SIGMOIDOSCOPY;  Surgeon: Leighton Ruff, MD;  Location: [facility];  Service: General;  Laterality: N/A;  . FLEXIBLE SIGMOIDOSCOPY N/A 11/18/2013   Procedure: FLEXIBLE SIGMOIDOSCOPY;  Surgeon: Leighton Ruff, MD;  [facility]: [facility];  Service: General;  Laterality: N/A;  . FLEXIBLE SIGMOIDOSCOPY N/A 08/01/2015   Procedure: FLEXIBLE SIGMOIDOSCOPY with biopsies;  Surgeon: Leighton Ruff, MD;  Location: [facility];  Service: General;  Laterality: N/A;  . LAPAROSCOPIC LOW ANTERIOR RESECTION N/A 06/14/2013   Procedure: LAPAROSCOPIC LOW ANTERIOR RESECTION AND DIVERTING LAPAROSCOPIC ILEOSTOMY;  Surgeon: Leighton Ruff, MD;  Location: [facility];  Service: General;  Laterality: N/A;  with splenic flexure takdown  . LAPAROSCOPY N/A 08/01/2015   Procedure: LAPAROSCOPY ;  Surgeon: Leighton Ruff, MD;  Location: [facility];  Service: General;  Laterality: N/A;  . TUBAL LIGATION  1994    Social History   Tobacco Use  . Smoking status: Current Some Day Smoker    Packs/day: 0.50    Years: 30.00    Pack years: 15.00    Types: Cigarettes  . Smokeless tobacco: Never Used  . Tobacco comment: 9 cigarettes/ per day. cut back from 2packs per day   Substance Use Topics  . Alcohol use: No    Alcohol/week: 0.0 standard drinks  . Drug use: No    Family History  Problem Relation Age of Onset  . Arthritis Mother   . Diabetes Mother   . Thyroid disease Mother   . Arthritis Father   . Diabetes Father   . Parkinson's disease Father   . Colon cancer Neg Hx   . Esophageal cancer Neg Hx   . Pancreatic cancer Neg Hx   . Rectal cancer Neg Hx   . Stomach cancer Neg Hx     Allergies  Allergen Reactions  . Demerol [Meperidine] Nausea  Only    Medication list has been reviewed and updated.  Current Outpatient Medications on File Prior to Visit  Medication Sig Dispense Refill  . alendronate (FOSAMAX) 70 MG tablet Take 1 tablet (70 mg total) by mouth every 7 (seven) days. Take with a full glass of water on an empty stomach. 4 tablet 11  . cholecalciferol (VITAMIN D) 1000 units tablet Take 1,000 Units by mouth daily.    Marland Kitchen ibuprofen (ADVIL,MOTRIN) 200 MG tablet Take 400 mg by mouth every 6 (six) hours as needed (Pain). Reported on 02/12/2016    . lovastatin (MEVACOR) 20 MG tablet Take 1 tablet (20 mg total) by mouth at bedtime. 90 tablet 3  . Melatonin 5 MG TABS Take by mouth.    Marland Kitchen  nicotine (NICODERM CQ - DOSED IN MG/24 HOURS) 21 mg/24hr patch Place 1 patch onto the skin daily.    . ondansetron (ZOFRAN) 8 MG tablet Take 1 tablet (8 mg total) by mouth every 8 (eight) hours as needed. for nausea 30 tablet 1  . OVER THE COUNTER MEDICATION Take 1 capsule by mouth daily. Biotin  tablet one a day - hair, skin nails    . traMADol (ULTRAM) 50 MG tablet Take 1 tablet (50 mg total) by mouth every 6 (six) hours as needed. for pain 60 tablet 3  . vitamin B-12 (CYANOCOBALAMIN) 500 MCG tablet Take 500 mcg by mouth daily.     No current facility-administered medications on file prior to visit.     Review of Systems:  As per HPI- otherwise negative.   Physical Examination: Vitals:   02/08/19 0835  BP: 126/78  Pulse: 96  Resp: 16  Temp: 97.9 F (36.6 C)  SpO2: 98%   Vitals:   02/08/19 0835  Weight: 116 lb (52.6 kg)  Height: 5\' 3"  (1.6 m)   Body mass index is 20.55 kg/m. Ideal Body Weight: Weight in (lb) to have BMI = 25: 140.8  GEN: WDWN, NAD, Non-toxic, A & O x 3, slim build, looks well  HEENT: Atraumatic, Normocephalic. Neck supple. No masses, No LAD. Ears and Nose: No external deformity. CV: RRR, No M/G/R. No JVD. No thrill. No extra heart sounds. PULM: CTA B, no wheezes, crackles, rhonchi. No retractions. No resp.  distress. No accessory muscle use. ABD: S, NT, ND, +BS. No rebound. No HSM. EXTR: No c/c/e NEURO Normal gait.  PSYCH: Normally interactive. Conversant. Not depressed or anxious appearing.  Calm demeanor.  Pap collected- vulva and vagina, cervix and adnexa normal  Right pinky toe:  Mild puffiness of the toe from trauma earlier this year but non tender, no bruise   Assessment and Plan:   ICD-10-CM   1. Primary osteoarthritis of both hands  M19.041    M19.042   2. Rectal cancer ([place])  C20   3. Claudication of lower extremity ([facility])  I73.9   4. Age-related osteoporosis without current pathological fracture  M81.0   5. Colostomy in place [facility])  Z93.3   6. Immunization due  Z23 Varicella-zoster vaccine IM (Shingrix)  7. Screening for cervical cancer  Z12.4 Cytology - PAP   Periodic follow-up visit today Labs pending - Will plan further follow- up pending labs. Encouraged her to stop smoking  Plan to visit in 6 months shingrix #2 given today   Follow-up: No follow-ups on file.  No orders of the defined types were placed in this encounter.  Orders Placed This Encounter  Procedures  . Varicella-zoster vaccine IM (Shingrix)     Signed Lamar Blinks, MD   Received her pap report 6/25- message to pt  Results for orders placed or performed in visit on 02/08/19  Cytology - PAP  Result Value Ref Range   Adequacy      Satisfactory for evaluation  endocervical/transformation zone component PRESENT.   Diagnosis      NEGATIVE FOR INTRAEPITHELIAL LESIONS OR MALIGNANCY.   HPV NOT DETECTED    Material Submitted CervicoVaginal Pap [ThinPrep Imaged]

## 2019-02-07 NOTE — Patient Instructions (Addendum)
It was great to see you today Remember that your mammogram appears to be due, and we need to repeat a DEXA scan this fall- if you need me to order these tests just let me know I will be in touch with your pap asap Let's meet in December for physical and labs Please do continue to work on quitting smoking totally!  Take care

## 2019-02-08 ENCOUNTER — Encounter: Payer: Self-pay | Admitting: Family Medicine

## 2019-02-08 ENCOUNTER — Ambulatory Visit (INDEPENDENT_AMBULATORY_CARE_PROVIDER_SITE_OTHER): Payer: BLUE CROSS/BLUE SHIELD | Admitting: Family Medicine

## 2019-02-08 ENCOUNTER — Other Ambulatory Visit: Payer: Self-pay

## 2019-02-08 ENCOUNTER — Other Ambulatory Visit (HOSPITAL_COMMUNITY)
Admission: RE | Admit: 2019-02-08 | Discharge: 2019-02-08 | Disposition: A | Discharge status: Home / Self Care | Payer: BLUE CROSS/BLUE SHIELD | Source: Ambulatory Visit | Attending: Family Medicine | Admitting: Family Medicine

## 2019-02-08 VITALS — BP 126/78 | HR 96 | Temp 97.9°F | Resp 16 | Ht 63.0 in | Wt 116.0 lb

## 2019-02-08 DIAGNOSIS — I739 Peripheral vascular disease, unspecified: Secondary | ICD-10-CM | POA: Diagnosis not present

## 2019-02-08 DIAGNOSIS — M810 Age-related osteoporosis without current pathological fracture: Secondary | ICD-10-CM

## 2019-02-08 DIAGNOSIS — M19042 Primary osteoarthritis, left hand: Secondary | ICD-10-CM

## 2019-02-08 DIAGNOSIS — M19041 Primary osteoarthritis, right hand: Secondary | ICD-10-CM

## 2019-02-08 DIAGNOSIS — Z124 Encounter for screening for malignant neoplasm of cervix: Secondary | ICD-10-CM | POA: Insufficient documentation

## 2019-02-08 DIAGNOSIS — C20 Malignant neoplasm of rectum: Secondary | ICD-10-CM | POA: Diagnosis not present

## 2019-02-08 DIAGNOSIS — Z23 Encounter for immunization: Secondary | ICD-10-CM

## 2019-02-08 DIAGNOSIS — Z933 Colostomy status: Secondary | ICD-10-CM

## 2019-02-09 MED ORDER — ONDANSETRON HCL 8 MG PO TABS: 8.0000 mg | ORAL_TABLET | Freq: Three times a day (TID) | ORAL | 2 refills | Status: DC | PRN

## 2019-02-09 MED ORDER — TRAMADOL HCL 50 MG PO TABS: 50.0000 mg | ORAL_TABLET | Freq: Four times a day (QID) | ORAL | 3 refills | Status: DC | PRN

## 2019-02-11 ENCOUNTER — Encounter: Payer: Self-pay | Admitting: Family Medicine

## 2019-02-11 LAB — CYTOLOGY - PAP
Diagnosis: NEGATIVE
HPV: NOT DETECTED

## 2019-03-16 ENCOUNTER — Encounter: Payer: Self-pay | Admitting: Family Medicine

## 2019-04-15 ENCOUNTER — Encounter: Payer: Self-pay | Admitting: Family Medicine

## 2019-04-18 ENCOUNTER — Encounter: Payer: Self-pay | Admitting: Family Medicine

## 2019-04-22 NOTE — Telephone Encounter (Signed)
Called [facility] for pt- they will schedule her further imaging

## 2019-05-24 LAB — HM MAMMOGRAPHY

## 2019-05-26 ENCOUNTER — Encounter: Payer: Self-pay | Admitting: Family Medicine

## 2019-06-03 ENCOUNTER — Encounter: Payer: Self-pay | Admitting: Family Medicine

## 2019-06-03 DIAGNOSIS — M19042 Primary osteoarthritis, left hand: Secondary | ICD-10-CM

## 2019-06-03 DIAGNOSIS — M19041 Primary osteoarthritis, right hand: Secondary | ICD-10-CM

## 2019-06-03 DIAGNOSIS — M810 Age-related osteoporosis without current pathological fracture: Secondary | ICD-10-CM

## 2019-06-03 MED ORDER — TRAMADOL HCL 50 MG PO TABS: 50.0000 mg | ORAL_TABLET | Freq: Four times a day (QID) | ORAL | 3 refills | Status: DC | PRN

## 2019-06-03 MED ORDER — ALENDRONATE SODIUM 70 MG PO TABS: 70.0000 mg | ORAL_TABLET | ORAL | 11 refills | Status: DC

## 2019-06-03 NOTE — Addendum Note (Signed)
Addended by: Wynonia Musty A on: 06/03/2019 03:14 PM   Modules accepted: Orders

## 2019-06-03 NOTE — Telephone Encounter (Signed)
Refill has been sent.  °

## 2019-08-04 NOTE — Progress Notes (Deleted)
Angela Schmidt at [facility] [address] [place], [place] [address] [id number] [phone]  Date:  08/11/2019   Name:  Angela Schmidt   DOB:  1960-11-13   MRN:  [id number]  PCP:  Darreld Mclean, MD    Chief Complaint: No chief complaint on file.   History of Present Illness:  Angela Schmidt is a 58 y.o. very pleasant female patient who presents with the following:  Here today for periodic follow-up visit History of rectal cancer, status post permanent colostomy 2014, osteoporosis, vitamin D deficiency  She is a smoker, and should qualify for lung cancer screening CT Most recent labs about 1 year ago  Mammogram up-to-date Pap up-to-date Immunizations including Shingrix up-to-date DEXA scan done in August Patient Active Problem List   Diagnosis Date Noted  . Osteoporosis 06/17/2017  . Vitamin D insufficiency 04/19/2016  . Tobacco use disorder 04/19/2016  . Absolute anemia 04/19/2016  . Breast cancer screening, high risk patient 04/19/2016  . Chronic back pain 04/19/2016  . Colostomy in place [facility]) 04/19/2016  . Rectal stenosis 04/04/2015  . Right thyroid nodule 03/07/2014  . Slow transit constipation 03/07/2014  . Personal history of malignant neoplasm of rectum 2014 02/21/2014  . Rectal cancer ([place]) 02/25/2013    Past Medical History:  Diagnosis Date  . Arthritis    LUMBAR  . 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 Complication of anesthesia    "shivers"  . History of kidney stones   . Rectal stenosis   . S/P ileostomy ([place])   . Wears contact lenses     Past Surgical History:  Procedure Laterality Date  . BOWEL RESECTION N/A 08/01/2015   Procedure: SMALL BOWEL RESECTION, takedown of colorectal anastomosis with colostomy;  Surgeon: Leighton Ruff,  MD;  [facility]: [facility];  Service: General;  Laterality: N/A;  . BUNIONECTOMY Left 1984  . COLOSTOMY    . COLOSTOMY     Permenant Colostomy  . COLOSTOMY REVERSAL N/A 11/18/2013   Procedure: LOOP ILEOSTOMY CLOSURE, FLEXIBLE SIGMOIDOSCOPY;  Surgeon: Leighton Ruff, MD;  [facility];  Service: General;  Laterality: N/A;  . EUS N/A 02/25/2013   Procedure: LOWER ENDOSCOPIC ULTRASOUND (EUS);  Surgeon: Milus Banister, MD;  Location: [facility];  Service: Endoscopy;  Laterality: N/A;  radial  . EXAMINATION UNDER ANESTHESIA N/A 07/21/2013   Procedure: EXAM UNDER ANESTHESIA/ RECTAL DILITATION;  Surgeon: Leighton Ruff, MD;  Location: [place];  Service: General;  Laterality: N/A;  . FLEXIBLE SIGMOIDOSCOPY N/A 07/21/2013   Procedure: FLEXIBLE SIGMOIDOSCOPY;  Surgeon: Leighton Ruff, MD;  Location: [place];  Service: General;  Laterality: N/A;  . FLEXIBLE SIGMOIDOSCOPY N/A 11/18/2013   Procedure: FLEXIBLE SIGMOIDOSCOPY;  Surgeon: Leighton Ruff, MD;  [facility];  Service: General;  Laterality: N/A;  . FLEXIBLE SIGMOIDOSCOPY N/A 08/01/2015   Procedure: FLEXIBLE SIGMOIDOSCOPY with biopsies;  Surgeon: Leighton Ruff, MD;  [facility];  Service: General;  Laterality: N/A;  . LAPAROSCOPIC LOW ANTERIOR RESECTION N/A 06/14/2013   Procedure: LAPAROSCOPIC LOW ANTERIOR RESECTION AND DIVERTING LAPAROSCOPIC ILEOSTOMY;  Surgeon: Leighton Ruff, MD;  [facility];  Service: General;  Laterality: N/A;  with splenic flexure takdown  . LAPAROSCOPY N/A 08/01/2015   Procedure: LAPAROSCOPY ;  Surgeon: Leighton Ruff, MD;  [facility];  Service: General;  Laterality: N/A;  . TUBAL LIGATION  1994    Social History   Tobacco Use  . Smoking status: Current Some Day Smoker    Packs/day: 0.50    Years: 30.00    Pack years: 15.00    Types: Cigarettes  . Smokeless tobacco: Never Used  . Tobacco comment: 9 cigarettes/ per day. cut back from 2packs per day   Substance Use  Topics  . Alcohol use: No    Alcohol/week: 0.0 standard drinks  . Drug use: No    Family History  Problem Relation Age of Onset  . Arthritis Mother   . Diabetes Mother   . Thyroid disease Mother   . Arthritis Father   . Diabetes Father   . Parkinson's disease Father   . Colon cancer Neg Hx   . Esophageal cancer Neg Hx   . Pancreatic cancer Neg Hx   . Rectal cancer Neg Hx   . Stomach cancer Neg Hx     Allergies  Allergen Reactions  . Demerol [Meperidine] Nausea Only    Medication list has been reviewed and updated.  Current Outpatient Medications on File Prior to Visit  Medication Sig Dispense Refill  . alendronate (FOSAMAX) 70 MG tablet Take 1 tablet (70 mg total) by mouth every 7 (seven) days. Take with a full glass of water on an empty stomach. 4 tablet 11  . cholecalciferol (VITAMIN D) 1000 units tablet Take 1,000 Units by mouth daily.    Marland Kitchen ibuprofen (ADVIL,MOTRIN) 200 MG tablet Take 400 mg by mouth every 6 (six) hours as needed (Pain). Reported on 02/12/2016    . lovastatin (MEVACOR) 20 MG tablet Take 1 tablet (20 mg total) by mouth at bedtime. 90 tablet 3  . Melatonin 5 MG TABS Take by mouth.    . nicotine (NICODERM CQ - DOSED IN MG/24 HOURS) 21 mg/24hr patch Place 1 patch onto the skin daily.    . ondansetron (ZOFRAN) 8 MG tablet Take 1 tablet (8 mg total) by mouth every 8 (eight) hours as needed. for nausea 30 tablet 2  . OVER THE COUNTER MEDICATION Take 1 capsule by mouth daily. Biotin  tablet one a day - hair, skin nails    . traMADol (ULTRAM) 50 MG tablet Take 1 tablet (50 mg total) by mouth every 6 (six) hours as needed. for pain 60 tablet 3  . vitamin B-12 (CYANOCOBALAMIN) 500 MCG tablet Take 500 mcg by mouth daily.     No current facility-administered medications on file prior to visit.    Review of Systems:  As per HPI- otherwise negative.   Physical Examination: There were no vitals filed for this visit. There were no vitals filed for this  visit. There is no height or weight on file to calculate BMI. Ideal Body Weight:    GEN: WDWN, NAD, Non-toxic, A & O x 3 HEENT: Atraumatic, Normocephalic. Neck supple. No masses, No LAD. Ears and Nose: No external deformity. CV: RRR, No M/G/R. No JVD. No thrill. No extra heart sounds. PULM: CTA B, no wheezes, crackles, rhonchi. No retractions. No resp. distress. No accessory muscle use. ABD: S, NT, ND, +BS. No rebound. No HSM. EXTR: No c/c/e NEURO Normal gait.  PSYCH: Normally interactive. Conversant. Not depressed or anxious appearing.  Calm demeanor.    Assessment and Plan: *** This visit occurred during the SARS-CoV-2 public health emergency.  Safety protocols were in place, including screening questions prior to the visit, additional usage of staff PPE,  and extensive cleaning of exam room while observing appropriate contact time as indicated for disinfecting solutions.    Signed Lamar Blinks, MD

## 2019-08-10 ENCOUNTER — Encounter: Payer: Self-pay | Admitting: Family Medicine

## 2019-08-11 ENCOUNTER — Ambulatory Visit: Payer: BLUE CROSS/BLUE SHIELD | Admitting: Family Medicine

## 2019-08-24 NOTE — Patient Instructions (Signed)
It was a pleasure to see you again today, I will be in touch with your labs ASAP You might try a "sun lamp" to mimic the rays of the sun for your home- this can be helpful to use esp in the early am hours I think your elbow issue may actually be tendonitis/ tennis elbow An OTC tennis elbow strap may be helpful I also gave you a hand- out with some exercises to try for your elbow    Health Maintenance, Female Adopting a healthy lifestyle and getting preventive care are important in promoting health and wellness. Ask your health care provider about:  The right schedule for you to have regular tests and exams.  Things you can do on your own to prevent diseases and keep yourself healthy. What should I know about diet, weight, and exercise? Eat a healthy diet   Eat a diet that includes plenty of vegetables, fruits, low-fat dairy products, and lean protein.  Do not eat a lot of foods that are high in solid fats, added sugars, or sodium. Maintain a healthy weight Body mass index (BMI) is used to identify weight problems. It estimates body fat based on height and weight. Your health care provider can help determine your BMI and help you achieve or maintain a healthy weight. Get regular exercise Get regular exercise. This is one of the most important things you can do for your health. Most adults should:  Exercise for at least 150 minutes each week. The exercise should increase your heart rate and make you sweat (moderate-intensity exercise).  Do strengthening exercises at least twice a week. This is in addition to the moderate-intensity exercise.  Spend less time sitting. Even light physical activity can be beneficial. Watch cholesterol and blood lipids Have your blood tested for lipids and cholesterol at 58 years of age, then have this test every 5 years. Have your cholesterol levels checked more often if:  Your lipid or cholesterol levels are high.  You are older than 58 years of  age.  You are at high risk for heart disease. What should I know about cancer screening? Depending on your health history and family history, you may need to have cancer screening at various ages. This may include screening for:  Breast cancer.  Cervical cancer.  Colorectal cancer.  Skin cancer.  Lung cancer. What should I know about heart disease, diabetes, and high blood pressure? Blood pressure and heart disease  High blood pressure causes heart disease and increases the risk of stroke. This is more likely to develop in people who have high blood pressure readings, are of African descent, or are overweight.  Have your blood pressure checked: ? Every 3-5 years if you are 42-76 years of age. ? Every year if you are 81 years old or older. Diabetes Have regular diabetes screenings. This checks your fasting blood sugar level. Have the screening done:  Once every three years after age 33 if you are at a normal weight and have a low risk for diabetes.  More often and at a younger age if you are overweight or have a high risk for diabetes. What should I know about preventing infection? Hepatitis B If you have a higher risk for hepatitis B, you should be screened for this virus. Talk with your health care provider to find out if you are at risk for hepatitis B infection. Hepatitis C Testing is recommended for:  Everyone born from [birth year] through 1964.  Anyone with known risk factors  for hepatitis C. Sexually transmitted infections (STIs)  Get screened for STIs, including gonorrhea and chlamydia, if: ? You are sexually active and are younger than 58 years of age. ? You are older than 58 years of age and your health care provider tells you that you are at risk for this type of infection. ? Your sexual activity has changed since you were last screened, and you are at increased risk for chlamydia or gonorrhea. Ask your health care provider if you are at risk.  Ask your health care  provider about whether you are at high risk for HIV. Your health care provider may recommend a prescription medicine to help prevent HIV infection. If you choose to take medicine to prevent HIV, you should first get tested for HIV. You should then be tested every 3 months for as long as you are taking the medicine. Pregnancy  If you are about to stop having your period (premenopausal) and you may become pregnant, seek counseling before you get pregnant.  Take 400 to 800 micrograms (mcg) of folic acid every day if you become pregnant.  Ask for birth control (contraception) if you want to prevent pregnancy. Osteoporosis and menopause Osteoporosis is a disease in which the bones lose minerals and strength with aging. This can result in bone fractures. If you are 46 years old or older, or if you are at risk for osteoporosis and fractures, ask your health care provider if you should:  Be screened for bone loss.  Take a calcium or vitamin D supplement to lower your risk of fractures.  Be given hormone replacement therapy (HRT) to treat symptoms of menopause. Follow these instructions at home: Lifestyle  Do not use any products that contain nicotine or tobacco, such as cigarettes, e-cigarettes, and chewing tobacco. If you need help quitting, ask your health care provider.  Do not use street drugs.  Do not share needles.  Ask your health care provider for help if you need support or information about quitting drugs. Alcohol use  Do not drink alcohol if: ? Your health care provider tells you not to drink. ? You are pregnant, may be pregnant, or are planning to become pregnant.  If you drink alcohol: ? Limit how much you use to 0-1 drink a day. ? Limit intake if you are breastfeeding.  Be aware of how much alcohol is in your drink. In the [place] one drink equals one 12 oz bottle of beer (355 mL), one 5 oz glass of wine (148 mL), or one 1 oz glass of hard liquor (44 mL). General  instructions  Schedule regular health, dental, and eye exams.  Stay current with your vaccines.  Tell your health care provider if: ? You often feel depressed. ? You have ever been abused or do not feel safe at home. Summary  Adopting a healthy lifestyle and getting preventive care are important in promoting health and wellness.  Follow your health care provider's instructions about healthy diet, exercising, and getting tested or screened for diseases.  Follow your health care provider's instructions on monitoring your cholesterol and blood pressure. This information is not intended to replace advice given to you by your health care provider. Make sure you discuss any questions you have with your health care provider. Document Revised: 07/29/2018 Document Reviewed: 07/29/2018 Elsevier Patient Education  2020 [employer].

## 2019-08-24 NOTE — Progress Notes (Addendum)
[place] at [facility] [place], [place], [place] [address] [phone]  Date:  08/26/2019   Name:  Angela Schmidt   DOB:  Dec 20, 1960   MRN:  [id number]  PCP:  Darreld Mclean, MD    Chief Complaint: Osteoarthritis (6 month follow up)   History of Present Illness:  Angela Schmidt is a 60 y.o. very pleasant female patient who presents with the following:  Here today for routine follow-up visit/physical exam  history of rectal cancer 2014, permanent ostomy in place, peripheral artery disease and claudication  She has arthritis, most bothersome in her hands, and uses tramadol as needed She sees Dr. Benay Spice for her rectal cancer care, but does not need to see him for a few years now Also history of osteoporosis and vitamin D insufficiency-taking Fosamax Last seen by myself in June 2020  Screening exams are up-to-date Flu shot done Shingrix done Due for complete labs today She had a bone density in August which showed improvement of her osteoporosis She had a quiet holiday with her daughters which was a comfort for her  Wt Readings from Last 3 Encounters:  08/26/19 114 lb (51.7 kg)  02/08/19 116 lb (52.6 kg)  08/05/18 119 lb (54 kg)   Mariette notes that she tends to have some mild seasonal affective disorder.  She has noted this a bit earlier than usual this year, she thinks due to pandemic and isolation.  We discussed methods to manage this issue, at this point she does not wish to use medication.  I suggested exercise, time outdoors, and perhaps an artificial sunlight lamp.  She will let me know if she needs anything further  She also notes concern of pain in her left elbow, wonders if she is having arthritis in her elbow.  Her elderly dog has needed more hands-on care recently, she is having to lift him a few times a day.  Patient Active Problem List   Diagnosis Date Noted  . Osteoporosis 06/17/2017  . Vitamin D  insufficiency 04/19/2016  . Tobacco use disorder 04/19/2016  . Absolute anemia 04/19/2016  . Breast cancer screening, high risk patient 04/19/2016  . Chronic back pain 04/19/2016  . Colostomy in place [facility]) 04/19/2016  . Rectal stenosis 04/04/2015  . Right thyroid nodule 03/07/2014  . Slow transit constipation 03/07/2014  . Personal history of malignant neoplasm of rectum 2014 02/21/2014  . Rectal cancer ([place]) 02/25/2013    Past Medical History:  Diagnosis Date  . Arthritis    LUMBAR  . 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 Complication of anesthesia    "shivers"  . History of kidney stones   . Rectal stenosis   . S/P ileostomy ([place])   . Wears contact lenses     Past Surgical History:  Procedure Laterality Date  . BOWEL RESECTION N/A 08/01/2015   Procedure: SMALL BOWEL RESECTION, takedown of colorectal anastomosis with colostomy;  Surgeon: Leighton Ruff, MD;  [facility];  Service: General;  Laterality: N/A;  . BUNIONECTOMY Left 1984  . COLOSTOMY    . COLOSTOMY     Permenant Colostomy  . COLOSTOMY REVERSAL N/A 11/18/2013   Procedure: LOOP ILEOSTOMY CLOSURE, FLEXIBLE SIGMOIDOSCOPY;  Surgeon: Leighton Ruff, MD;  [facility];  Service: General;  Laterality:  N/A;  . EUS N/A 02/25/2013   Procedure: LOWER ENDOSCOPIC ULTRASOUND (EUS);  Surgeon: Milus Banister, MD;  Location[facility];  Service: Endoscopy;  Laterality: N/A;  radial  . EXAMINATION UNDER ANESTHESIA N/A 07/21/2013   Procedure: EXAM UNDER ANESTHESIA/ RECTAL DILITATION;  Surgeon: Leighton Ruff, MD;  Location: [place];  Service: General;  Laterality: N/A;  . FLEXIBLE SIGMOIDOSCOPY N/A 07/21/2013   Procedure: FLEXIBLE SIGMOIDOSCOPY;  Surgeon: Leighton Ruff, MD;  Location: [place];  Service: General;   Laterality: N/A;  . FLEXIBLE SIGMOIDOSCOPY N/A 11/18/2013   Procedure: FLEXIBLE SIGMOIDOSCOPY;  Surgeon: Leighton Ruff, MD;  [facility];  Service: General;  Laterality: N/A;  . FLEXIBLE SIGMOIDOSCOPY N/A 08/01/2015   Procedure: FLEXIBLE SIGMOIDOSCOPY with biopsies;  Surgeon: Leighton Ruff, MD;  [facility];  Service: General;  Laterality: N/A;  . LAPAROSCOPIC LOW ANTERIOR RESECTION N/A 06/14/2013   Procedure: LAPAROSCOPIC LOW ANTERIOR RESECTION AND DIVERTING LAPAROSCOPIC ILEOSTOMY;  Surgeon: Leighton Ruff, MD;  [facility];  Service: General;  Laterality: N/A;  with splenic flexure takdown  . LAPAROSCOPY N/A 08/01/2015   Procedure: LAPAROSCOPY ;  Surgeon: Leighton Ruff, MD;  [facility];  Service: General;  Laterality: N/A;  . TUBAL LIGATION  1994    Social History   Tobacco Use  . Smoking status: Current Some Day Smoker    Packs/day: 0.50    Years: 30.00    Pack years: 15.00    Types: Cigarettes  . Smokeless tobacco: Never Used  . Tobacco comment: 9 cigarettes/ per day. cut back from 2packs per day   Substance Use Topics  . Alcohol use: No    Alcohol/week: 0.0 standard drinks  . Drug use: No    Family History  Problem Relation Age of Onset  . Arthritis Mother   . Diabetes Mother   . Thyroid disease Mother   . Arthritis Father   . Diabetes Father   . Parkinson's disease Father   . Colon cancer Neg Hx   . Esophageal cancer Neg Hx   . Pancreatic cancer Neg Hx   . Rectal cancer Neg Hx   . Stomach cancer Neg Hx     Allergies  Allergen Reactions  . Demerol [Meperidine] Nausea Only    Medication list has been reviewed and updated.  Current Outpatient Medications on File Prior to Visit  Medication Sig Dispense Refill  . alendronate (FOSAMAX) 70 MG tablet Take 1 tablet (70 mg total) by mouth every 7 (seven) days. Take with a full glass of water on an empty stomach. 4 tablet 11  . cholecalciferol (VITAMIN D) 1000 units tablet Take 1,000 Units by mouth  daily.    Marland Kitchen ibuprofen (ADVIL,MOTRIN) 200 MG tablet Take 400 mg by mouth every 6 (six) hours as needed (Pain). Reported on 02/12/2016    . Melatonin 5 MG TABS Take by mouth.    . nicotine (NICODERM CQ - DOSED IN MG/24 HOURS) 21 mg/24hr patch Place 1 patch onto the skin daily.    Marland Kitchen OVER THE COUNTER MEDICATION Take 1 capsule by mouth daily. Biotin  tablet one a day - hair, skin nails    . vitamin B-12 (CYANOCOBALAMIN) 500 MCG tablet Take 500 mcg by mouth daily.     No current facility-administered medications on file prior to visit.    Review of Systems:  As per HPI- otherwise negative. No fever or chills, no chest pain or shortness of breath  Physical Examination: Vitals:   08/26/19 0809  08/26/19 0836  BP: 122/82   Pulse: (!) 103 90  Resp: 16   Temp: (!) 97.5 F (36.4 C)   SpO2: 99%    Vitals:   08/26/19 0809  Weight: 114 lb (51.7 kg)  Height: 5\' 3"  (1.6 m)   Body mass index is 20.19 kg/m. Ideal Body Weight: Weight in (lb) to have BMI = 25: 140.8  GEN: WDWN, NAD, Non-toxic, A & O x 3, normal weight, looks well  HEENT: Atraumatic, Normocephalic. Neck supple. No masses, No LAD.  TM wnl bilaterally  Ears and Nose: No external deformity. CV: RRR, No M/G/R. No JVD. No thrill. No extra heart sounds. PULM: CTA B, no wheezes, crackles, rhonchi. No retractions. No resp. distress. No accessory muscle use. ABD: S, NT, ND, +BS. No rebound. No HSM. Ostomy in place EXTR: No c/c/e NEURO Normal gait.  PSYCH: Normally interactive. Conversant. Not depressed or anxious appearing.  Calm demeanor.  Tenderness at the left lateral elbow epicondyle, consistent with lateral epicondylitis  Pulse Readings from Last 3 Encounters:  08/26/19 90  02/08/19 96  08/05/18 87     Assessment and Plan: Physical exam  Primary osteoarthritis of both hands - Plan: traMADol (ULTRAM) 50 MG tablet  Age-related osteoporosis without current pathological fracture  Rectal cancer (HCC) - Plan: ondansetron  (ZOFRAN) 8 MG tablet  Colostomy in place ([place])  History of anemia - Plan: CBC  Hyperkalemia - Plan: Comprehensive metabolic panel  Screening for hyperlipidemia - Plan: Lipid panel  Screening for diabetes mellitus - Plan: Hemoglobin A1c  Screening for thyroid disorder - Plan: TSH  Vitamin D deficiency - Plan: Vitamin D (25 hydroxy)  Claudication of lower extremity ([place]) - Plan: lovastatin (MEVACOR) 20 MG tablet  Here today for physical and follow-up Refilled her medications as above Labs pending- Will plan further follow- up pending labs. Delcina has an ostomy, she continues to do all of her normal activities and is really adjusted beautifully to have an ostomy bag Discussed her left elbow pain.  Advised her that this is probably tendinitis as opposed to arthritis.  Advised to try over-the-counter tennis elbow strap, and also gave her a handout with elbow exercises  Will plan further follow- up pending labs.  This visit occurred during the SARS-CoV-2 public health emergency.  Safety protocols were in place, including screening questions prior to the visit, additional usage of staff PPE, and extensive cleaning of exam room while observing appropriate contact time as indicated for disinfecting solutions.    Signed Lamar Blinks, MD  Received her labs, message to patient  Results for orders placed or performed in visit on 08/26/19  CBC  Result Value Ref Range   WBC 4.8 4.0 - 10.5 K/uL   RBC 4.82 3.87 - 5.11 Mil/uL   Platelets 348.0 150.0 - 400.0 K/uL   Hemoglobin 15.2 (H) 12.0 - 15.0 g/dL   HCT 45.6 36.0 - 46.0 %   MCV 94.5 78.0 - 100.0 fl   MCHC 33.3 30.0 - 36.0 g/dL   RDW 13.0 11.5 - 15.5 %  Comprehensive metabolic panel  Result Value Ref Range   Sodium 141 135 - 145 mEq/L   Potassium 4.5 3.5 - 5.1 mEq/L   Chloride 106 96 - 112 mEq/L   CO2 29 19 - 32 mEq/L   Glucose, Bld 64 (L) 70 - 99 mg/dL   BUN 13 6 - 23 mg/dL   Creatinine, Ser 0.96 0.40 - 1.20 mg/dL   Total  Bilirubin 0.4 0.2 - 1.2  mg/dL   Alkaline Phosphatase 78 39 - 117 U/L   AST 15 0 - 37 U/L   ALT 16 0 - 35 U/L   Total Protein 6.5 6.0 - 8.3 g/dL   Albumin 4.0 3.5 - 5.2 g/dL   GFR 59.74 (L) >60.00 mL/min   Calcium 10.0 8.4 - 10.5 mg/dL  Hemoglobin A1c  Result Value Ref Range   Hgb A1c MFr Bld 5.6 4.6 - 6.5 %  TSH  Result Value Ref Range   TSH 0.83 0.35 - 4.50 uIU/mL  Vitamin D (25 hydroxy)  Result Value Ref Range   VITD 53.66 30.00 - 100.00 ng/mL  Lipid panel  Result Value Ref Range   Cholesterol 161 0 - 200 mg/dL   Triglycerides 153.0 (H) 0.0 - 149.0 mg/dL   HDL 59.70 >39.00 mg/dL   VLDL 30.6 0.0 - 40.0 mg/dL   LDL Cholesterol 71 0 - 99 mg/dL   Total CHOL/HDL Ratio 3    NonHDL 101.45

## 2019-08-25 ENCOUNTER — Other Ambulatory Visit: Payer: Self-pay

## 2019-08-26 ENCOUNTER — Ambulatory Visit (INDEPENDENT_AMBULATORY_CARE_PROVIDER_SITE_OTHER): Payer: BLUE CROSS/BLUE SHIELD | Admitting: Family Medicine

## 2019-08-26 ENCOUNTER — Other Ambulatory Visit: Payer: Self-pay

## 2019-08-26 ENCOUNTER — Encounter: Payer: Self-pay | Admitting: Family Medicine

## 2019-08-26 VITALS — BP 122/82 | HR 90 | Temp 97.5°F | Resp 16 | Ht 63.0 in | Wt 114.0 lb

## 2019-08-26 DIAGNOSIS — Z131 Encounter for screening for diabetes mellitus: Secondary | ICD-10-CM

## 2019-08-26 DIAGNOSIS — C20 Malignant neoplasm of rectum: Secondary | ICD-10-CM

## 2019-08-26 DIAGNOSIS — Z1329 Encounter for screening for other suspected endocrine disorder: Secondary | ICD-10-CM | POA: Diagnosis not present

## 2019-08-26 DIAGNOSIS — M810 Age-related osteoporosis without current pathological fracture: Secondary | ICD-10-CM

## 2019-08-26 DIAGNOSIS — I739 Peripheral vascular disease, unspecified: Secondary | ICD-10-CM

## 2019-08-26 DIAGNOSIS — Z13220 Encounter for screening for lipoid disorders: Secondary | ICD-10-CM | POA: Diagnosis not present

## 2019-08-26 DIAGNOSIS — Z862 Personal history of diseases of the blood and blood-forming organs and certain disorders involving the immune mechanism: Secondary | ICD-10-CM

## 2019-08-26 DIAGNOSIS — Z933 Colostomy status: Secondary | ICD-10-CM

## 2019-08-26 DIAGNOSIS — E875 Hyperkalemia: Secondary | ICD-10-CM | POA: Diagnosis not present

## 2019-08-26 DIAGNOSIS — E559 Vitamin D deficiency, unspecified: Secondary | ICD-10-CM

## 2019-08-26 DIAGNOSIS — Z0000 Encounter for general adult medical examination without abnormal findings: Secondary | ICD-10-CM | POA: Diagnosis not present

## 2019-08-26 DIAGNOSIS — M19042 Primary osteoarthritis, left hand: Secondary | ICD-10-CM

## 2019-08-26 DIAGNOSIS — M19041 Primary osteoarthritis, right hand: Secondary | ICD-10-CM

## 2019-08-26 LAB — LIPID PANEL
Cholesterol: 161 mg/dL (ref 0–200)
HDL: 59.7 mg/dL (ref >39.00)
LDL Cholesterol: 71 mg/dL (ref 0–99)
NonHDL: 101.45
Total CHOL/HDL Ratio: 3
Triglycerides: 153 mg/dL — ABNORMAL HIGH (ref 0.0–149.0)
VLDL: 30.6 mg/dL (ref 0.0–40.0)

## 2019-08-26 LAB — CBC
HCT: 45.6 % (ref 36.0–46.0)
Hemoglobin: 15.2 g/dL — ABNORMAL HIGH (ref 12.0–15.0)
MCHC: 33.3 g/dL (ref 30.0–36.0)
MCV: 94.5 fl (ref 78.0–100.0)
Platelets: 348 10*3/uL (ref 150.0–400.0)
RBC: 4.82 Mil/uL (ref 3.87–5.11)
RDW: 13 % (ref 11.5–15.5)
WBC: 4.8 10*3/uL (ref 4.0–10.5)

## 2019-08-26 LAB — COMPREHENSIVE METABOLIC PANEL
ALT: 16 U/L (ref 0–35)
AST: 15 U/L (ref 0–37)
Albumin: 4 g/dL (ref 3.5–5.2)
Alkaline Phosphatase: 78 U/L (ref 39–117)
BUN: 13 mg/dL (ref 6–23)
CO2: 29 mEq/L (ref 19–32)
Calcium: 10 mg/dL (ref 8.4–10.5)
Chloride: 106 mEq/L (ref 96–112)
Creatinine, Ser: 0.96 mg/dL (ref 0.40–1.20)
GFR: 59.74 mL/min — ABNORMAL LOW (ref >60.00)
Glucose, Bld: 64 mg/dL — ABNORMAL LOW (ref 70–99)
Potassium: 4.5 mEq/L (ref 3.5–5.1)
Sodium: 141 mEq/L (ref 135–145)
Total Bilirubin: 0.4 mg/dL (ref 0.2–1.2)
Total Protein: 6.5 g/dL (ref 6.0–8.3)

## 2019-08-26 LAB — TSH: TSH: 0.83 u[IU]/mL (ref 0.35–4.50)

## 2019-08-26 LAB — HEMOGLOBIN A1C: Hgb A1c MFr Bld: 5.6 % (ref 4.6–6.5)

## 2019-08-26 LAB — VITAMIN D 25 HYDROXY (VIT D DEFICIENCY, FRACTURES): VITD: 53.66 ng/mL (ref 30.00–100.00)

## 2019-08-26 MED ORDER — ONDANSETRON HCL 8 MG PO TABS: 8.0000 mg | ORAL_TABLET | Freq: Three times a day (TID) | ORAL | 2 refills | Status: DC | PRN

## 2019-08-26 MED ORDER — TRAMADOL HCL 50 MG PO TABS
50.0000 mg | ORAL_TABLET | Freq: Four times a day (QID) | ORAL | 3 refills | Status: DC | PRN
Start: 2019-08-26 — End: 2019-12-17

## 2019-08-26 MED ORDER — LOVASTATIN 20 MG PO TABS: 20.0000 mg | ORAL_TABLET | Freq: Every day | ORAL | 3 refills | Status: DC

## 2019-11-05 ENCOUNTER — Ambulatory Visit: Payer: BLUE CROSS/BLUE SHIELD | Attending: Internal Medicine

## 2019-11-05 DIAGNOSIS — Z23 Encounter for immunization: Secondary | ICD-10-CM

## 2019-11-05 NOTE — Progress Notes (Signed)
   Covid-19 Vaccination Clinic  Name:  Marshal Stoos    MRN: [id number] DOB: 1961/03/11  11/05/2019  Ms. Slansky was observed post Covid-19 immunization for 15 minutes without incident. She was provided with Vaccine Information Sheet and instruction to access the V-Safe system.   Ms. Ocejo was instructed to call 911 with any severe reactions post vaccine: Marland Kitchen Difficulty breathing  . Swelling of face and throat  . A fast heartbeat  . A bad rash all over body  . Dizziness and weakness   Immunizations Administered    Name Date Dose VIS Date Route   Pfizer COVID-19 Vaccine 11/05/2019  3:27 PM 0.3 mL 07/30/2019 Intramuscular   Manufacturer: [place]   Lot: [id number]   [place]: [id number]

## 2019-11-06 IMAGING — MG DIGITAL SCREENING BILATERAL MAMMOGRAM WITH TOMO AND CAD
8 series · 9 of 24 positions shown · non-contrast
Comparison: Previous exam(s).

CLINICAL DATA: Screening.

EXAM:
DIGITAL SCREENING BILATERAL MAMMOGRAM WITH TOMO AND CAD

[L MLO synth-2D]
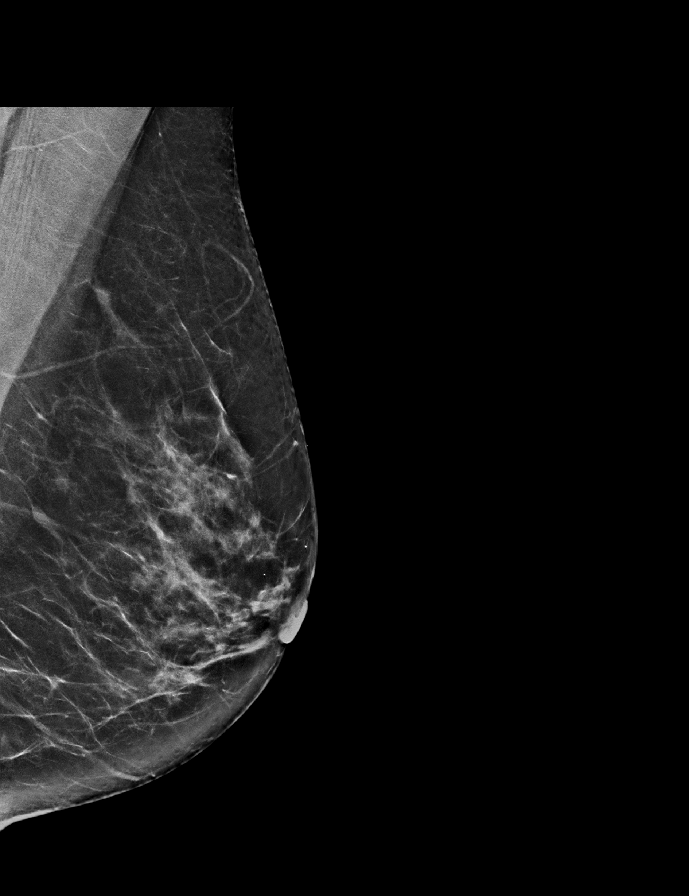

[R MLO synth-2D]
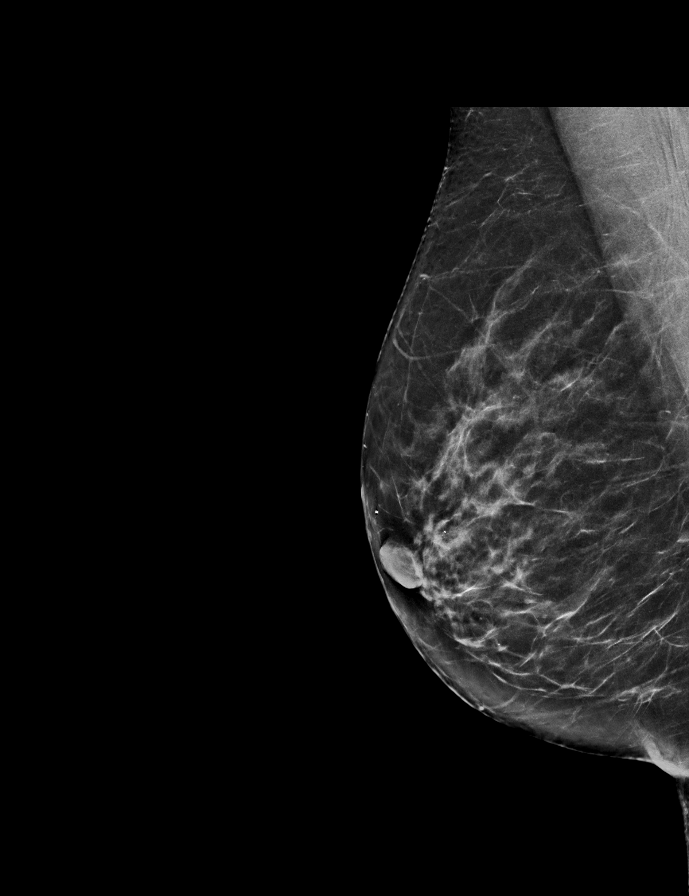

[R CC synth-2D]
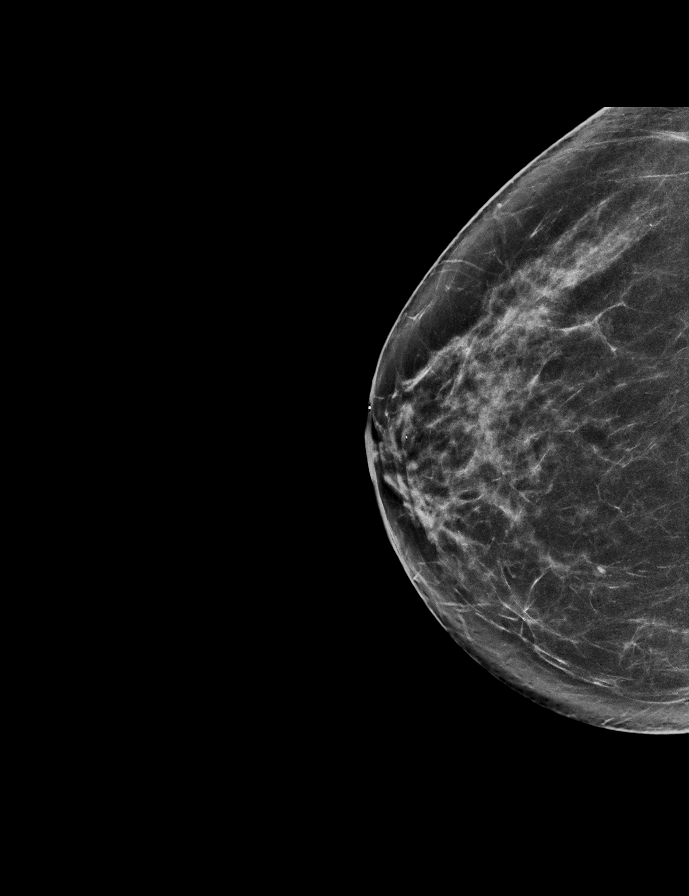

[L CC synth-2D]
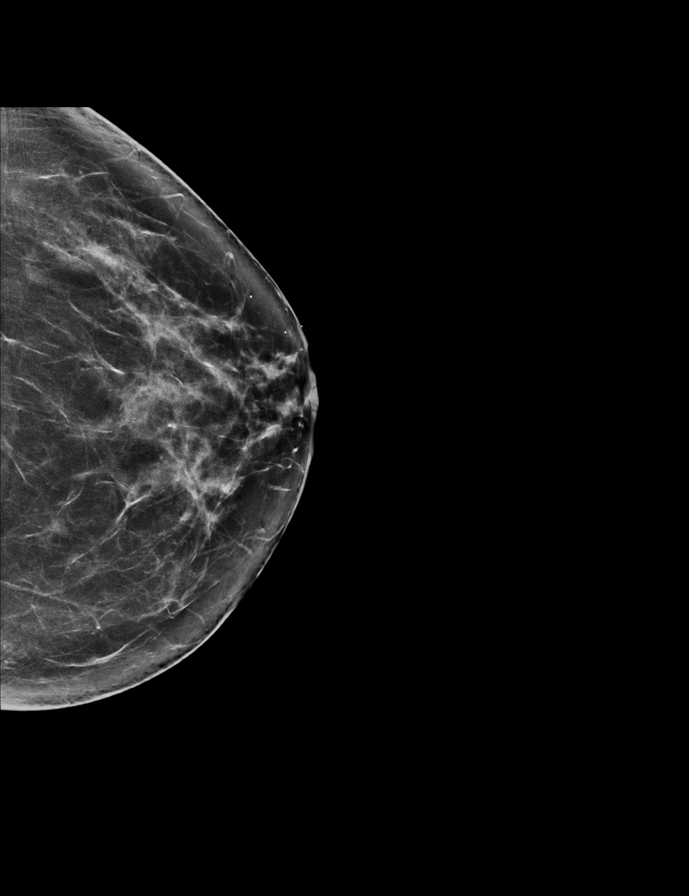

[L MLO tomo · 2 of 67 frames shown]
[frame 22/67]
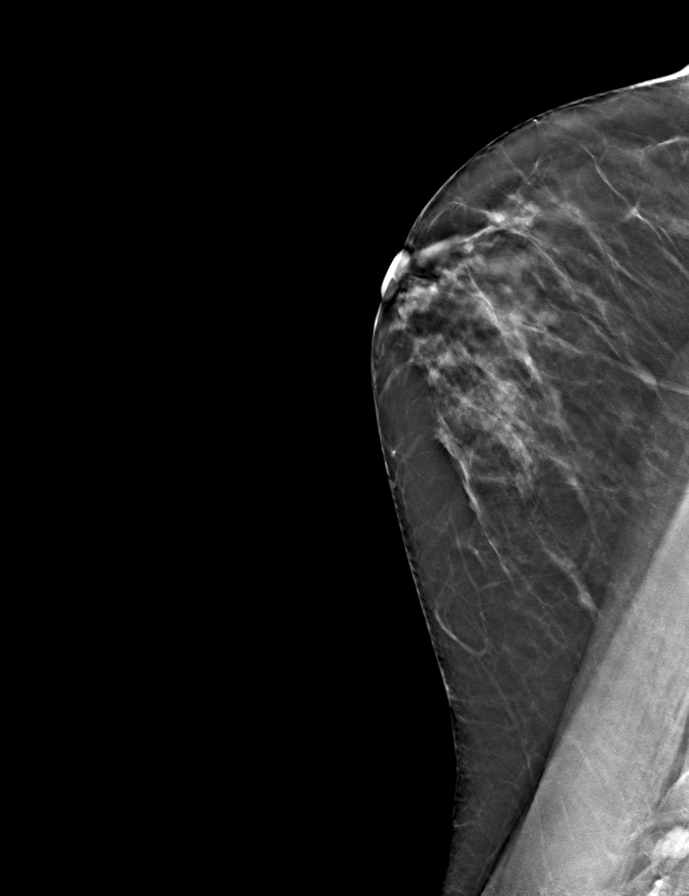
[frame 34/67]
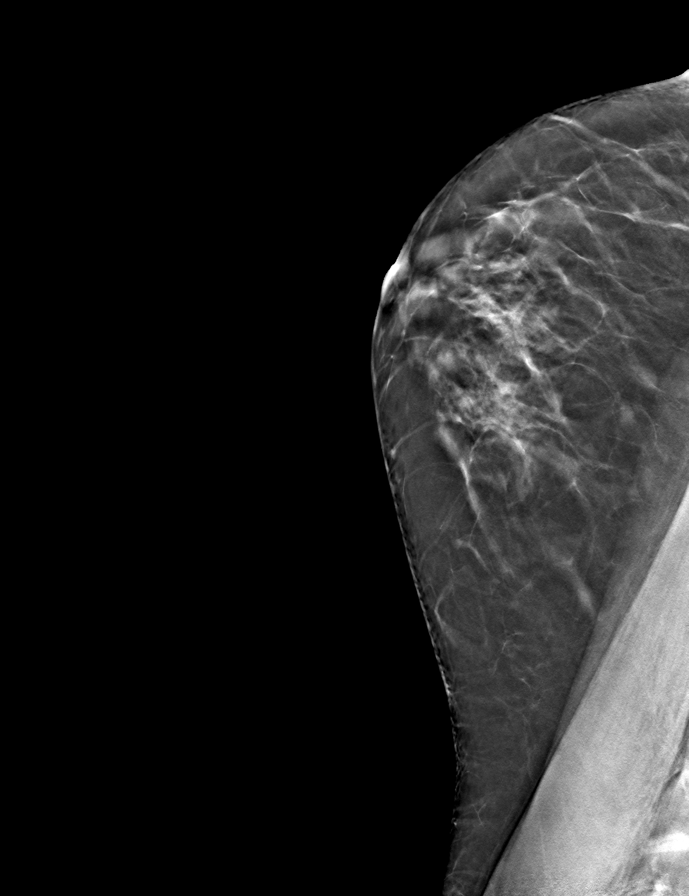

[L CC tomo · tomo slice 35/69.0]
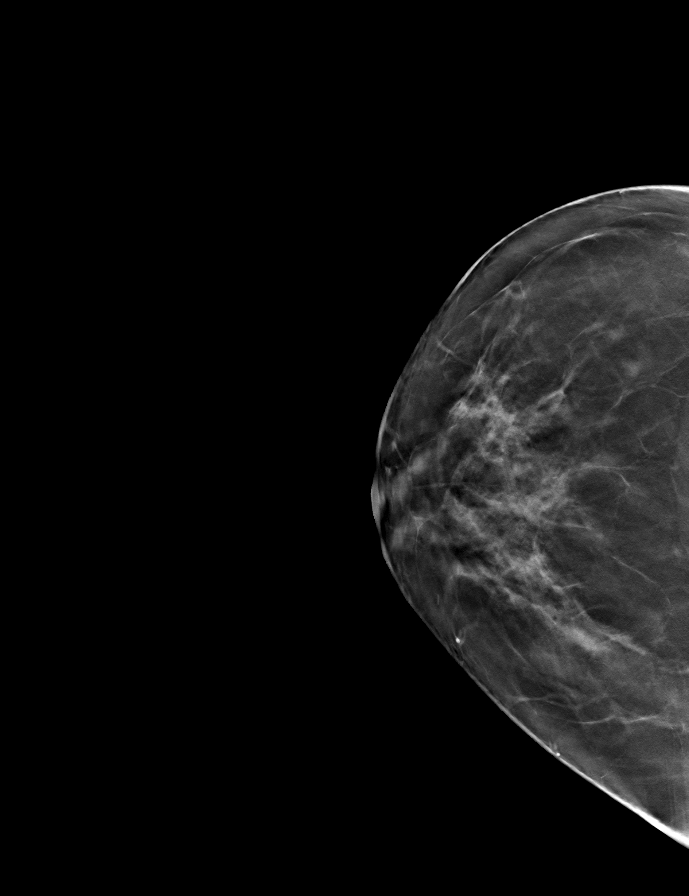

[R MLO tomo · tomo slice 35/69.0]
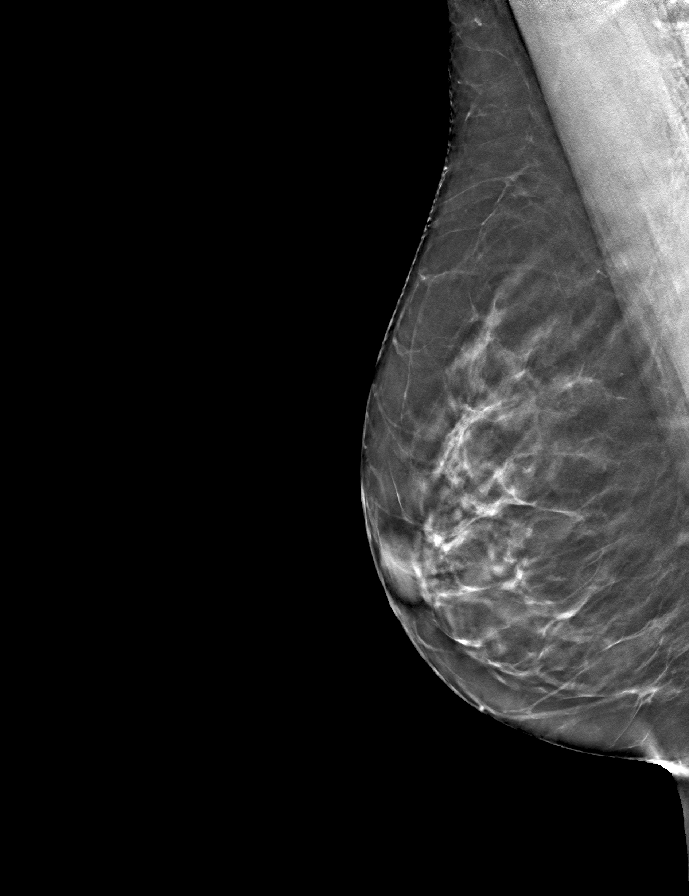

[R CC tomo · tomo slice 33/66.0]
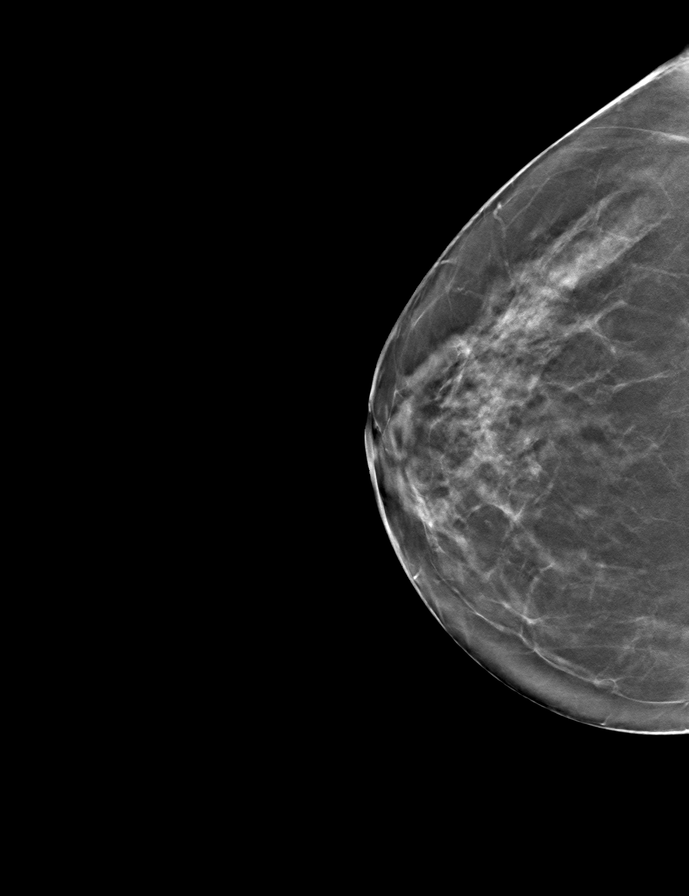

[9 of 24 positions shown; findings below may reference images not displayed]

ACR Breast Density Category c: The breast tissue is heterogeneously
dense, which may obscure small masses.
FINDINGS: There are no findings suspicious for malignancy. Images were
processed with CAD.
IMPRESSION: No mammographic evidence of malignancy. A result letter of this
screening mammogram will be mailed directly to the patient.

RECOMMENDATION:
Screening mammogram in one year. (Code:FT-[id number])

BI-RADS CATEGORY  1: Negative.

## 2019-12-01 ENCOUNTER — Ambulatory Visit: Payer: BLUE CROSS/BLUE SHIELD | Attending: Internal Medicine

## 2019-12-01 DIAGNOSIS — Z23 Encounter for immunization: Secondary | ICD-10-CM

## 2019-12-01 NOTE — Progress Notes (Signed)
   Covid-19 Vaccination Clinic  Name:  Angela Schmidt    MRN: [id number] DOB: October 13, 1960  12/01/2019  [name] was observed post Covid-19 immunization for 15 minutes without incident. She was provided with Vaccine Information Sheet and instruction to access the V-Safe system.   [name] was instructed to call 911 with any severe reactions post vaccine: Marland Kitchen Difficulty breathing  . Swelling of face and throat  . A fast heartbeat  . A bad rash all over body  . Dizziness and weakness   Immunizations Administered    Name Date Dose VIS Date Route   Pfizer COVID-19 Vaccine 12/01/2019  3:57 PM 0.3 mL 07/30/2019 Intramuscular   Manufacturer: [place]   Lot: [id number]   [place]: [id number]

## 2019-12-17 ENCOUNTER — Encounter: Payer: Self-pay | Admitting: Family Medicine

## 2019-12-17 DIAGNOSIS — M19041 Primary osteoarthritis, right hand: Secondary | ICD-10-CM

## 2019-12-17 DIAGNOSIS — M19042 Primary osteoarthritis, left hand: Secondary | ICD-10-CM

## 2019-12-17 MED ORDER — TRAMADOL HCL 50 MG PO TABS: 50.0000 mg | ORAL_TABLET | Freq: Four times a day (QID) | ORAL | 3 refills | Status: DC | PRN

## 2019-12-17 NOTE — Telephone Encounter (Signed)
Requesting: Tramadol Contract: none, needs csc [id number] Last Visit:08/26/2019 Next Visit:03/02/2020 Last Refill:08/26/2019  Please Advise

## 2020-03-01 NOTE — Progress Notes (Signed)
[place] at [facility] [address], [place], [place] [address] [phone]  Date:  03/02/2020   Name:  Angela Schmidt   DOB:  07/13/1961   MRN:  [id number]  PCP:  Darreld Mclean, MD    Chief Complaint: Follow-up   History of Present Illness:  Angela Schmidt is a 59 y.o. very pleasant female patient who presents with the following:  Here today for 18-month follow-up visit.  I last saw Jacolyn in January for her routine physical She has history of rectal cancer 2014 with permanent ostomy, peripheral artery disease and claudication, arthritis which is most severe in her hands, osteoporosis and vitamin D deficiency taking Fosamax  She is seeing a counselor/ life coach recently and she does feel like this is helpful to her She tried a few counselors and found one that fits her personality  Her arthritis is stable - bothers her but generally ok  At this time she does not wish to use any medication for anxiety or depression She feels like she needs more to do in her life- she got a bit lonely during the pandemic, got too used to being on her own.  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   Pt is not overly concerned that she could have this condition but wanted to mention it   Her screening is all up-to-date Covid series, Shingrix complete Labs done in January-looked okay  Fosamax Lovastatin Tramadol as needed for arthritis pain, mostly in her hands  B12 supplement Vitamin D supplement Patient Active Problem List   Diagnosis Date Noted  . Osteoporosis 06/17/2017  . Vitamin D insufficiency 04/19/2016  . Tobacco use disorder 04/19/2016  . Absolute anemia  04/19/2016  . Breast cancer screening, high risk patient 04/19/2016  . Chronic back pain 04/19/2016  . Colostomy in place [facility]) 04/19/2016  . Rectal stenosis 04/04/2015  . Right thyroid nodule 03/07/2014  . Slow transit constipation 03/07/2014  . Personal history of malignant neoplasm of rectum 2014 02/21/2014  . Rectal cancer ([place]) 02/25/2013    Past Medical History:  Diagnosis Date  . Arthritis    LUMBAR  . 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 Complication of anesthesia    "shivers"  . History of kidney stones   . Rectal stenosis   . S/P ileostomy ([place])   . Wears contact lenses     Past Surgical History:  Procedure Laterality Date  . BOWEL RESECTION N/A 08/01/2015   Procedure: SMALL BOWEL RESECTION, takedown of colorectal anastomosis with colostomy;  Surgeon: Leighton Ruff, MD;  Location: [facility];  Service: General;  Laterality: N/A;  . BUNIONECTOMY Left 1984  . COLOSTOMY    . COLOSTOMY     Permenant Colostomy  . COLOSTOMY REVERSAL N/A 11/18/2013   Procedure: LOOP ILEOSTOMY CLOSURE, FLEXIBLE SIGMOIDOSCOPY;  Surgeon: Leighton Ruff, MD;  [facility];  Service: General;  Laterality: N/A;  . EUS N/A 02/25/2013   Procedure: LOWER ENDOSCOPIC ULTRASOUND (EUS);  Surgeon: Milus Banister, MD;  Location: [facility];  Service: Endoscopy;  Laterality: N/A;  radial  . EXAMINATION UNDER ANESTHESIA N/A 07/21/2013   Procedure: EXAM UNDER ANESTHESIA/ RECTAL DILITATION;  Surgeon: Leighton Ruff, MD;  Location: [place];  Service: General;  Laterality: N/A;  . FLEXIBLE SIGMOIDOSCOPY N/A 07/21/2013   Procedure: FLEXIBLE SIGMOIDOSCOPY;  Surgeon: Leighton Ruff, MD;  Location: [place];  Service: General;  Laterality: N/A;  . FLEXIBLE SIGMOIDOSCOPY N/A 11/18/2013   Procedure: FLEXIBLE  SIGMOIDOSCOPY;  Surgeon: Leighton Ruff, MD;  [facility];  Service: General;  Laterality: N/A;  . FLEXIBLE SIGMOIDOSCOPY N/A 08/01/2015   Procedure: FLEXIBLE SIGMOIDOSCOPY with biopsies;  Surgeon: Leighton Ruff, MD;  [facility];  Service: General;  Laterality: N/A;  . LAPAROSCOPIC LOW ANTERIOR RESECTION N/A 06/14/2013   Procedure: LAPAROSCOPIC LOW ANTERIOR RESECTION AND DIVERTING LAPAROSCOPIC ILEOSTOMY;  Surgeon: Leighton Ruff, MD;  [facility];  Service: General;  Laterality: N/A;  with splenic flexure takdown  . LAPAROSCOPY N/A 08/01/2015   Procedure: LAPAROSCOPY ;  Surgeon: Leighton Ruff, MD;  [facility];  Service: General;  Laterality: N/A;  . TUBAL LIGATION  1994    Social History   Tobacco Use  . Smoking status: Current Some Day Smoker    Packs/day: 0.50    Years: 30.00    Pack years: 15.00    Types: Cigarettes  . Smokeless tobacco: Never Used  . Tobacco comment: 9 cigarettes/ per day. cut back from 2packs per day   Substance Use Topics  . Alcohol use: No    Alcohol/week: 0.0 standard drinks  . Drug use: No    Family History  Problem Relation Age of Onset  . Arthritis Mother   . Diabetes Mother   . Thyroid disease Mother   . Arthritis Father   . Diabetes Father   . Parkinson's disease Father   . Colon cancer Neg Hx   . Esophageal cancer Neg Hx   . Pancreatic cancer Neg Hx   . Rectal cancer Neg Hx   . Stomach cancer Neg Hx     Allergies  Allergen Reactions  . Demerol [Meperidine] Nausea Only    Medication list has been reviewed and updated.  Current Outpatient Medications on File Prior to Visit  Medication Sig Dispense Refill  . alendronate (FOSAMAX) 70 MG tablet Take 1 tablet (70 mg total) by mouth every 7 (seven) days. Take with a full glass of water on an empty stomach. 4 tablet 11  . cholecalciferol (VITAMIN D) 1000 units tablet Take 1,000 Units by mouth daily.    Marland Kitchen ibuprofen (ADVIL,MOTRIN) 200 MG tablet Take 400 mg by mouth every 6  (six) hours as needed (Pain). Reported on 02/12/2016    . lovastatin (MEVACOR) 20 MG tablet Take 1 tablet (20 mg total) by mouth at bedtime. 90 tablet 3  . Melatonin 5 MG TABS Take by mouth.    . nicotine (NICODERM CQ - DOSED IN MG/24 HOURS) 21 mg/24hr patch Place 1 patch onto the skin daily.    . ondansetron (ZOFRAN) 8 MG tablet Take 1 tablet (8 mg total) by mouth every 8 (eight) hours as needed. for nausea 30 tablet 2  . OVER THE COUNTER MEDICATION Take 1 capsule by mouth daily. Biotin  tablet one a day - hair, skin nails    . traMADol (ULTRAM) 50 MG tablet Take 1 tablet (50 mg total) by mouth every 6 (six) hours as needed. for pain 60 tablet 3  .  vitamin B-12 (CYANOCOBALAMIN) 500 MCG tablet Take 500 mcg by mouth daily.     No current facility-administered medications on file prior to visit.    Review of Systems:  As per HPI- otherwise negative.   Physical Examination: Vitals:   03/02/20 0834  BP: 122/72  Pulse: 86  Resp: 16  Temp: 97.9 F (36.6 C)  SpO2: 98%   Vitals:   03/02/20 0834  Weight: 115 lb (52.2 kg)  Height: 5\' 3"  (1.6 m)   Body mass index is 20.37 kg/m. Ideal Body Weight: Weight in (lb) to have BMI = 25: 140.8  GEN: no acute distress. Slim build, looks well  HEENT: Atraumatic, Normocephalic.  Ears and Nose: No external deformity. CV: RRR, No M/G/R. No JVD. No thrill. No extra heart sounds. PULM: CTA B, no wheezes, crackles, rhonchi. No retractions. No resp. distress. No accessory muscle use. ABD: S, NT, ND, +BS. No rebound. No HSM.  Ostomy bag in place EXTR: No c/c/e PSYCH: Normally interactive. Conversant.    Assessment and Plan: Adjustment disorder with mixed anxiety and depressed mood  Primary osteoarthritis of both hands - Plan: traMADol (ULTRAM) 50 MG tablet  Age-related osteoporosis without current pathological fracture  Rectal cancer ([place])  Following up today- she continues to use tramadol as needed for her OA in her hands.  There is now some  family history of auto-immune disorder.  She would prefer to consider drawing autoimmune labs with her next PE, she is not really concerned about this She is UTD on her colonoscopy She has been a bit isolated and lonely during the pandemic.  She is seeing a Social worker and has plans to improve this, does not wish to use any medication at this time but she will keep me posted Plan to visit in 6 months assuming all is well  This visit occurred during the SARS-CoV-2 public health emergency.  Safety protocols were in place, including screening questions prior to the visit, additional usage of staff PPE, and extensive cleaning of exam room while observing appropriate contact time as indicated for disinfecting solutions.    Signed Lamar Blinks, MD

## 2020-03-02 ENCOUNTER — Other Ambulatory Visit: Payer: Self-pay

## 2020-03-02 ENCOUNTER — Ambulatory Visit (INDEPENDENT_AMBULATORY_CARE_PROVIDER_SITE_OTHER): Payer: BLUE CROSS/BLUE SHIELD | Admitting: Family Medicine

## 2020-03-02 ENCOUNTER — Encounter: Payer: Self-pay | Admitting: Family Medicine

## 2020-03-02 VITALS — BP 122/72 | HR 86 | Temp 97.9°F | Resp 16 | Ht 63.0 in | Wt 115.0 lb

## 2020-03-02 DIAGNOSIS — M19041 Primary osteoarthritis, right hand: Secondary | ICD-10-CM

## 2020-03-02 DIAGNOSIS — M810 Age-related osteoporosis without current pathological fracture: Secondary | ICD-10-CM | POA: Diagnosis not present

## 2020-03-02 DIAGNOSIS — F4323 Adjustment disorder with mixed anxiety and depressed mood: Secondary | ICD-10-CM | POA: Diagnosis not present

## 2020-03-02 DIAGNOSIS — M19042 Primary osteoarthritis, left hand: Secondary | ICD-10-CM

## 2020-03-02 DIAGNOSIS — C20 Malignant neoplasm of rectum: Secondary | ICD-10-CM

## 2020-03-02 MED ORDER — TRAMADOL HCL 50 MG PO TABS: 50.0000 mg | ORAL_TABLET | Freq: Four times a day (QID) | ORAL | 3 refills | Status: DC | PRN

## 2020-03-02 NOTE — Patient Instructions (Signed)
Great to see you again today!  Please see me in December/ January for your physical and labs  Let me know if I can help as far as any depression or anxiety symptoms

## 2020-05-08 ENCOUNTER — Encounter: Payer: Self-pay | Admitting: Family Medicine

## 2020-05-08 DIAGNOSIS — C20 Malignant neoplasm of rectum: Secondary | ICD-10-CM

## 2020-05-08 DIAGNOSIS — M810 Age-related osteoporosis without current pathological fracture: Secondary | ICD-10-CM

## 2020-05-08 MED ORDER — ONDANSETRON HCL 8 MG PO TABS: 8.0000 mg | ORAL_TABLET | Freq: Three times a day (TID) | ORAL | 2 refills | Status: DC | PRN

## 2020-05-08 MED ORDER — ALENDRONATE SODIUM 70 MG PO TABS: 70.0000 mg | ORAL_TABLET | ORAL | 11 refills | Status: DC

## 2020-07-05 ENCOUNTER — Encounter: Payer: Self-pay | Admitting: Family Medicine

## 2020-07-05 DIAGNOSIS — M19041 Primary osteoarthritis, right hand: Secondary | ICD-10-CM

## 2020-07-05 DIAGNOSIS — M19042 Primary osteoarthritis, left hand: Secondary | ICD-10-CM

## 2020-07-05 MED ORDER — TRAMADOL HCL 50 MG PO TABS: 50.0000 mg | ORAL_TABLET | Freq: Four times a day (QID) | ORAL | 3 refills | Status: DC | PRN

## 2020-07-05 NOTE — Telephone Encounter (Signed)
Requesting: tramadol 50mg  Contract: None ESL:PNPY  Last Visit: 03/02/2020 Next Visit: 08/03/2020 Last Refill: 03/02/2020 #60 and 3RF Pt sig: 1 tab q6h prn  Please Advise

## 2020-08-01 NOTE — Progress Notes (Addendum)
Angela Schmidt at [facility], [place], [place] [address] [phone]  Date:  08/03/2020   Name:  Angela Schmidt   DOB:  09/07/1960   MRN:  [id number]  PCP:  Darreld Mclean, MD    Chief Complaint: Annual Exam   History of Present Illness:  Angela Schmidt is a 59 y.o. very pleasant female patient who presents with the following:  Patient here today for physical exam Last seen by myself in July She has history of rectal cancer 2014 with permanent ostomy, peripheral artery disease and claudication, arthritis which is most severe in her hands, osteoporosis and vitamin D deficiency taking Fosamax, prediabetes, tobacco use She continues to have arthritis- she does use tramadol as needed   At that time she was using tramadol as needed for osteoarthritis in her hands.  She was also seeing a counselor-feeling lonely and isolated during the pandemic-and this was helpful for her She notes that seeing her counselor was helpful- she is done with this now  COVID-19 booster- not done yet, we talked about this and she plans to do it Flu vaccine- done in the fall  Mammogram- she palms to do this  Pap/colonoscopy up-to-date DEXA scan 1 year ago Shingrix is complete Blood work completed in July-noted to have mild polycythemia at that time  06/05/2020  03/02/2020   1  Tramadol Hcl 50 Mg Tablet  60.00  15  Je Cop  [id number]  [facility] ([id number])  3/3  20.00 MME  Comm Ins  [place]    05/08/2020  03/02/2020   1  Tramadol Hcl 50 Mg Tablet  60.00  15  Je Cop  [id number]  [facility] ([id number])  2/3  20.00 MME  Comm Ins  [place]    04/08/2020  03/02/2020   1  Tramadol Hcl 50 Mg Tablet  60.00  15  Je Cop  [id number]  Wal ([id number])  1/3  20.00 MME  Comm Ins  [place]    03/14/2020  12/17/2019   1  Tramadol Hcl 50 Mg Tablet  60.00  15  Je Cop  [id number]  [facility] ([id number])  3/3  20.00 MME  Comm Ins  [place]    03/02/2020  03/02/2020   1  Tramadol Hcl 50 Mg Tablet  60.00  15  Je Cop  [id number]  [facility] ([id number])   0/3  20.00 MME  Comm Ins  [place]    02/14/2020  12/17/2019   1  Tramadol Hcl 50 Mg Tablet  60.00  15  Je Cop  [id number]  [facility] ([id number])  2/3  20.00 MME  Comm Ins  [place]    01/14/2020  12/17/2019   1  Tramadol Hcl 50 Mg Tablet  60.00  15  Je Cop  [id number]          Patient Active Problem List   Diagnosis Date Noted  . Osteoporosis 06/17/2017  . Vitamin D insufficiency 04/19/2016  . Tobacco use disorder 04/19/2016  . Absolute anemia 04/19/2016  . Breast cancer screening, high risk patient 04/19/2016  . Chronic back pain 04/19/2016  . Colostomy in place [facility]) 04/19/2016  . Rectal stenosis 04/04/2015  . Right thyroid nodule 03/07/2014  . Slow transit constipation 03/07/2014  . Personal history of malignant neoplasm of rectum 2014 02/21/2014  . Rectal cancer ([place]) 02/25/2013    Past Medical History:  Diagnosis Date  . Arthritis    LUMBAR  . 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 Complication of anesthesia    "shivers"  . History of kidney stones   . Rectal stenosis   . S/P ileostomy ([place])   . Wears contact lenses     Past Surgical History:  Procedure Laterality Date  . BOWEL RESECTION N/A 08/01/2015   Procedure: SMALL BOWEL RESECTION, takedown of colorectal anastomosis with colostomy;  Surgeon: Leighton Ruff, MD;  [facility];  Service: General;  Laterality: N/A;  . BUNIONECTOMY Left 1984  . COLOSTOMY    . COLOSTOMY     Permenant Colostomy  . COLOSTOMY REVERSAL N/A 11/18/2013   Procedure: LOOP ILEOSTOMY CLOSURE, FLEXIBLE SIGMOIDOSCOPY;  Surgeon: Leighton Ruff, MD;  [facility];  Service: General;  Laterality: N/A;  . EUS N/A 02/25/2013   Procedure: LOWER ENDOSCOPIC ULTRASOUND (EUS);  Surgeon: Milus Banister, MD;  Location: [facility];  Service: Endoscopy;  Laterality: N/A;  radial  . EXAMINATION UNDER  ANESTHESIA N/A 07/21/2013   Procedure: EXAM UNDER ANESTHESIA/ RECTAL DILITATION;  Surgeon: Leighton Ruff, MD;  Location: [place];  Service: General;  Laterality: N/A;  . FLEXIBLE SIGMOIDOSCOPY N/A 07/21/2013   Procedure: FLEXIBLE SIGMOIDOSCOPY;  Surgeon: Leighton Ruff, MD;  Location: [place];  Service: General;  Laterality: N/A;  . FLEXIBLE SIGMOIDOSCOPY N/A 11/18/2013   Procedure: FLEXIBLE SIGMOIDOSCOPY;  Surgeon: Leighton Ruff, MD;  [facility];  Service: General;  Laterality: N/A;  . FLEXIBLE SIGMOIDOSCOPY N/A 08/01/2015   Procedure: FLEXIBLE SIGMOIDOSCOPY with biopsies;  Surgeon: Leighton Ruff, MD;  [facility];  Service: General;  Laterality: N/A;  . LAPAROSCOPIC LOW ANTERIOR RESECTION N/A 06/14/2013   Procedure: LAPAROSCOPIC LOW ANTERIOR RESECTION AND DIVERTING LAPAROSCOPIC ILEOSTOMY;  Surgeon: Leighton Ruff, MD;  [facility];  Service: General;  Laterality: N/A;  with splenic flexure takdown  . LAPAROSCOPY N/A 08/01/2015   Procedure: LAPAROSCOPY ;  Surgeon: Leighton Ruff, MD;  [facility];  Service: General;  Laterality: N/A;  . TUBAL LIGATION  1994    Social History   Tobacco Use  . Smoking status: Current Some Day Smoker    Packs/day: 0.50    Years: 30.00    Pack years: 15.00    Types: Cigarettes  . Smokeless tobacco: Never Used  . Tobacco comment: 9 cigarettes/ per day. cut back from 2packs per day   Substance Use Topics  . Alcohol use: No    Alcohol/week: 0.0 standard drinks  . Drug use: No    Family History  Problem Relation Age of Onset  . Arthritis Mother   . Diabetes Mother   . Thyroid disease Mother   . Arthritis Father   . Diabetes Father   . Parkinson's disease Father   . Colon cancer Neg Hx   . Esophageal cancer Neg Hx   . Pancreatic cancer Neg Hx   . Rectal cancer Neg Hx   . Stomach cancer Neg Hx     Allergies  Allergen Reactions  . Demerol [Meperidine] Nausea Only    Medication list has been  reviewed and updated.  Current Outpatient Medications on File Prior to Visit  Medication Sig Dispense Refill  . alendronate (FOSAMAX) 70 MG tablet Take 1 tablet (70 mg total) by mouth every 7 (seven) days. Take with a full glass of water on an empty stomach. 4 tablet 11  . cholecalciferol (VITAMIN D)  1000 units tablet Take 1,000 Units by mouth daily.    Marland Kitchen ibuprofen (ADVIL,MOTRIN) 200 MG tablet Take 400 mg by mouth every 6 (six) hours as needed (Pain). Reported on 02/12/2016    . lovastatin (MEVACOR) 20 MG tablet Take 1 tablet (20 mg total) by mouth at bedtime. 90 tablet 3  . Melatonin 5 MG TABS Take by mouth.    . nicotine (NICODERM CQ - DOSED IN MG/24 HOURS) 21 mg/24hr patch Place 1 patch onto the skin daily.    . ondansetron (ZOFRAN) 8 MG tablet Take 1 tablet (8 mg total) by mouth every 8 (eight) hours as needed. for nausea 30 tablet 2  . OVER THE COUNTER MEDICATION Take 1 capsule by mouth daily. Biotin  tablet one a day - hair, skin nails    . traMADol (ULTRAM) 50 MG tablet Take 1 tablet (50 mg total) by mouth every 6 (six) hours as needed. for pain 60 tablet 3  . vitamin B-12 (CYANOCOBALAMIN) 500 MCG tablet Take 500 mcg by mouth daily.     No current facility-administered medications on file prior to visit.    Review of Systems:  As per HPI- otherwise negative.Marland Kitchen  Physical Examination: Vitals:   08/03/20 0821  BP: 122/68  Pulse: 97  Resp: 17  SpO2: 97%   Vitals:   08/03/20 0821  Weight: 115 lb (52.2 kg)  Height: 5\' 3"  (1.6 m)   Body mass index is 20.37 kg/m. Ideal Body Weight: Weight in (lb) to have BMI = 25: 140.8  GEN: no acute distress. Slender build, looks well  HEENT: Atraumatic, Normocephalic.  Ears and Nose: No external deformity. CV: RRR, No M/G/R. No JVD. No thrill. No extra heart sounds. PULM: CTA B, no wheezes, crackles, rhonchi. No retractions. No resp. distress. No accessory muscle use. ABD: S, NT, ND, +BS. No rebound. No HSM.  Ostomy bag in place EXTR: No  c/c/e PSYCH: Normally interactive. Conversant.    Assessment and Plan: Physical exam  Primary osteoarthritis of both hands  Age-related osteoporosis without current pathological fracture  Rectal cancer ([place]) - Plan: Basic metabolic panel, CBC  Screening for diabetes mellitus - Plan: Hemoglobin A1c  Claudication of lower extremity (H[facility]) - Plan: lovastatin (MEVACOR) 20 MG tablet  Fatigue, unspecified type - Plan: TSH, Vitamin B12  CPE today Encouraged healthy diet and exercise routine Discussed lung cancer screening Ct- approx 15 pack year history but we might be able to get her qualified.  She prefers to do next year Will plan further follow- up pending labs. She plans to do her covid booster and then mammogram  This visit occurred during the SARS-CoV-2 public health emergency.  Safety protocols were in place, including screening questions prior to the visit, additional usage of staff PPE, and extensive cleaning of exam room while observing appropriate contact time as indicated for disinfecting solutions.    Signed Lamar Blinks, MD Received her labs as below, message to pt Results for orders placed or performed in visit on 62/83/66  Basic metabolic panel  Result Value Ref Range   Sodium 141 135 - 145 mEq/L   Potassium 4.1 3.5 - 5.1 mEq/L   Chloride 106 96 - 112 mEq/L   CO2 29 19 - 32 mEq/L   Glucose, Bld 56 (L) 70 - 99 mg/dL   BUN 14 6 - 23 mg/dL   Creatinine, Ser 0.80 0.40 - 1.20 mg/dL   GFR 81.05 >60.00 mL/min   Calcium 10.1 8.4 - 10.5 mg/dL  CBC  Result Value Ref Range   WBC 5.7 4.0 - 10.5 K/uL   RBC 4.90 3.87 - 5.11 Mil/uL   Platelets 319.0 150.0 - 400.0 K/uL   Hemoglobin 15.4 (H) 12.0 - 15.0 g/dL   HCT 45.7 36.0 - 46.0 %   MCV 93.1 78.0 - 100.0 fl   MCHC 33.7 30.0 - 36.0 g/dL   RDW 13.2 11.5 - 15.5 %  Hemoglobin A1c  Result Value Ref Range   Hgb A1c MFr Bld 5.6 4.6 - 6.5 %  TSH  Result Value Ref Range   TSH 1.21 0.35 - 4.50 uIU/mL  Vitamin B12  Result  Value Ref Range   Vitamin B-12 204 (L) 211 - 911 pg/mL

## 2020-08-01 NOTE — Patient Instructions (Signed)
It was great to see you again today, I will be in touch your labs as soon as possible Have a wonderful holiday!  Please see me in about 6 months for our recheck  We can think about a lung cancer screening CT at some point  I would recommend getting your covid booster and delaying your mammo by a month or so    Health Maintenance, Female Adopting a healthy lifestyle and getting preventive care are important in promoting health and wellness. Ask your health care provider about:  The right schedule for you to have regular tests and exams.  Things you can do on your own to prevent diseases and keep yourself healthy. What should I know about diet, weight, and exercise? Eat a healthy diet   Eat a diet that includes plenty of vegetables, fruits, low-fat dairy products, and lean protein.  Do not eat a lot of foods that are high in solid fats, added sugars, or sodium. Maintain a healthy weight Body mass index (BMI) is used to identify weight problems. It estimates body fat based on height and weight. Your health care provider can help determine your BMI and help you achieve or maintain a healthy weight. Get regular exercise Get regular exercise. This is one of the most important things you can do for your health. Most adults should:  Exercise for at least 150 minutes each week. The exercise should increase your heart rate and make you sweat (moderate-intensity exercise).  Do strengthening exercises at least twice a week. This is in addition to the moderate-intensity exercise.  Spend less time sitting. Even light physical activity can be beneficial. Watch cholesterol and blood lipids Have your blood tested for lipids and cholesterol at 58 years of age, then have this test every 5 years. Have your cholesterol levels checked more often if:  Your lipid or cholesterol levels are high.  You are older than 58 years of age.  You are at high risk for heart disease. What should I know about  cancer screening? Depending on your health history and family history, you may need to have cancer screening at various ages. This may include screening for:  Breast cancer.  Cervical cancer.  Colorectal cancer.  Skin cancer.  Lung cancer. What should I know about heart disease, diabetes, and high blood pressure? Blood pressure and heart disease  High blood pressure causes heart disease and increases the risk of stroke. This is more likely to develop in people who have high blood pressure readings, are of African descent, or are overweight.  Have your blood pressure checked: ? Every 3-5 years if you are 26-56 years of age. ? Every year if you are 39 years old or older. Diabetes Have regular diabetes screenings. This checks your fasting blood sugar level. Have the screening done:  Once every three years after age 24 if you are at a normal weight and have a low risk for diabetes.  More often and at a younger age if you are overweight or have a high risk for diabetes. What should I know about preventing infection? Hepatitis B If you have a higher risk for hepatitis B, you should be screened for this virus. Talk with your health care provider to find out if you are at risk for hepatitis B infection. Hepatitis C Testing is recommended for:  Everyone born from [birth year] through 1964.  Anyone with known risk factors for hepatitis C. Sexually transmitted infections (STIs)  Get screened for STIs, including gonorrhea and chlamydia,  if: ? You are sexually active and are younger than 58 years of age. ? You are older than 58 years of age and your health care provider tells you that you are at risk for this type of infection. ? Your sexual activity has changed since you were last screened, and you are at increased risk for chlamydia or gonorrhea. Ask your health care provider if you are at risk.  Ask your health care provider about whether you are at high risk for HIV. Your health care  provider may recommend a prescription medicine to help prevent HIV infection. If you choose to take medicine to prevent HIV, you should first get tested for HIV. You should then be tested every 3 months for as long as you are taking the medicine. Pregnancy  If you are about to stop having your period (premenopausal) and you may become pregnant, seek counseling before you get pregnant.  Take 400 to 800 micrograms (mcg) of folic acid every day if you become pregnant.  Ask for birth control (contraception) if you want to prevent pregnancy. Osteoporosis and menopause Osteoporosis is a disease in which the bones lose minerals and strength with aging. This can result in bone fractures. If you are 73 years old or older, or if you are at risk for osteoporosis and fractures, ask your health care provider if you should:  Be screened for bone loss.  Take a calcium or vitamin D supplement to lower your risk of fractures.  Be given hormone replacement therapy (HRT) to treat symptoms of menopause. Follow these instructions at home: Lifestyle  Do not use any products that contain nicotine or tobacco, such as cigarettes, e-cigarettes, and chewing tobacco. If you need help quitting, ask your health care provider.  Do not use street drugs.  Do not share needles.  Ask your health care provider for help if you need support or information about quitting drugs. Alcohol use  Do not drink alcohol if: ? Your health care provider tells you not to drink. ? You are pregnant, may be pregnant, or are planning to become pregnant.  If you drink alcohol: ? Limit how much you use to 0-1 drink a day. ? Limit intake if you are breastfeeding.  Be aware of how much alcohol is in your drink. In the [place] one drink equals one 12 oz bottle of beer (355 mL), one 5 oz glass of wine (148 mL), or one 1 oz glass of hard liquor (44 mL). General instructions  Schedule regular health, dental, and eye exams.  Stay current  with your vaccines.  Tell your health care provider if: ? You often feel depressed. ? You have ever been abused or do not feel safe at home. Summary  Adopting a healthy lifestyle and getting preventive care are important in promoting health and wellness.  Follow your health care provider's instructions about healthy diet, exercising, and getting tested or screened for diseases.  Follow your health care provider's instructions on monitoring your cholesterol and blood pressure. This information is not intended to replace advice given to you by your health care provider. Make sure you discuss any questions you have with your health care provider. Document Revised: 07/29/2018 Document Reviewed: 07/29/2018 Elsevier Patient Education  2020 [employer].

## 2020-08-03 ENCOUNTER — Encounter: Payer: Self-pay | Admitting: Family Medicine

## 2020-08-03 ENCOUNTER — Other Ambulatory Visit: Payer: Self-pay

## 2020-08-03 ENCOUNTER — Ambulatory Visit (INDEPENDENT_AMBULATORY_CARE_PROVIDER_SITE_OTHER): Payer: BLUE CROSS/BLUE SHIELD | Admitting: Family Medicine

## 2020-08-03 VITALS — BP 122/68 | HR 97 | Resp 17 | Ht 63.0 in | Wt 115.0 lb

## 2020-08-03 DIAGNOSIS — Z0000 Encounter for general adult medical examination without abnormal findings: Secondary | ICD-10-CM | POA: Diagnosis not present

## 2020-08-03 DIAGNOSIS — M19042 Primary osteoarthritis, left hand: Secondary | ICD-10-CM

## 2020-08-03 DIAGNOSIS — Z131 Encounter for screening for diabetes mellitus: Secondary | ICD-10-CM | POA: Diagnosis not present

## 2020-08-03 DIAGNOSIS — C20 Malignant neoplasm of rectum: Secondary | ICD-10-CM | POA: Diagnosis not present

## 2020-08-03 DIAGNOSIS — I739 Peripheral vascular disease, unspecified: Secondary | ICD-10-CM

## 2020-08-03 DIAGNOSIS — R5383 Other fatigue: Secondary | ICD-10-CM | POA: Diagnosis not present

## 2020-08-03 DIAGNOSIS — M19041 Primary osteoarthritis, right hand: Secondary | ICD-10-CM

## 2020-08-03 DIAGNOSIS — M810 Age-related osteoporosis without current pathological fracture: Secondary | ICD-10-CM

## 2020-08-03 LAB — BASIC METABOLIC PANEL
BUN: 14 mg/dL (ref 6–23)
CO2: 29 mEq/L (ref 19–32)
Calcium: 10.1 mg/dL (ref 8.4–10.5)
Chloride: 106 mEq/L (ref 96–112)
Creatinine, Ser: 0.8 mg/dL (ref 0.40–1.20)
GFR: 81.05 mL/min (ref >60.00)
Glucose, Bld: 56 mg/dL — ABNORMAL LOW (ref 70–99)
Potassium: 4.1 mEq/L (ref 3.5–5.1)
Sodium: 141 mEq/L (ref 135–145)

## 2020-08-03 LAB — VITAMIN B12: Vitamin B-12: 204 pg/mL — ABNORMAL LOW (ref 211–911)

## 2020-08-03 LAB — CBC
HCT: 45.7 % (ref 36.0–46.0)
Hemoglobin: 15.4 g/dL — ABNORMAL HIGH (ref 12.0–15.0)
MCHC: 33.7 g/dL (ref 30.0–36.0)
MCV: 93.1 fl (ref 78.0–100.0)
Platelets: 319 10*3/uL (ref 150.0–400.0)
RBC: 4.9 Mil/uL (ref 3.87–5.11)
RDW: 13.2 % (ref 11.5–15.5)
WBC: 5.7 10*3/uL (ref 4.0–10.5)

## 2020-08-03 LAB — TSH: TSH: 1.21 u[IU]/mL (ref 0.35–4.50)

## 2020-08-03 LAB — HEMOGLOBIN A1C: Hgb A1c MFr Bld: 5.6 % (ref 4.6–6.5)

## 2020-08-03 MED ORDER — LOVASTATIN 20 MG PO TABS
20.0000 mg | ORAL_TABLET | Freq: Every day | ORAL | 3 refills | Status: DC
Start: 2020-08-03 — End: 2021-09-17

## 2020-09-28 ENCOUNTER — Encounter: Payer: Self-pay | Admitting: Family Medicine

## 2020-09-28 LAB — HM MAMMOGRAPHY

## 2020-10-30 ENCOUNTER — Encounter: Payer: Self-pay | Admitting: Family Medicine

## 2020-10-30 DIAGNOSIS — M19042 Primary osteoarthritis, left hand: Secondary | ICD-10-CM

## 2020-10-30 DIAGNOSIS — M19041 Primary osteoarthritis, right hand: Secondary | ICD-10-CM

## 2020-10-30 MED ORDER — TRAMADOL HCL 50 MG PO TABS: 50.0000 mg | ORAL_TABLET | Freq: Four times a day (QID) | ORAL | 3 refills | Status: DC | PRN

## 2021-01-26 NOTE — Patient Instructions (Addendum)
Good to see you again today, I will be in touch with your labs soon as possible.  Assuming all is well, please see me about 6 months  Please schedule your bone density this august with [facility] Address: [address] [place], [place], [place] [address] Phone: [phone]  Great job with cutting down on smoking- try to quit totally if you can

## 2021-01-26 NOTE — Progress Notes (Addendum)
Angela Schmidt at [facility] [address] [place], [place] [address] [phone]  Date:  02/01/2021   Name:  Angela Schmidt   DOB:  1961/01/24   MRN:  [id number]  PCP:  Darreld Mclean, MD    Chief Complaint: No chief complaint on file.   History of Present Illness:  Angela Schmidt is a 60 y.o. very pleasant female patient who presents with the following:  Here today for a 47-month follow-up visit.  Most recent visit with myself was in December  She has history of rectal cancer 2014 with permanent ostomy, peripheral artery disease and claudication, arthritis which is most severe in her hands, osteoporosis and vitamin D deficiency taking Fosamax, prediabetes, tobacco use She continues to have arthritis pain- she does use tramadol as needed  This does help, sx are about the same- she has OA pain in her hands and lower back.  Tramadol does help   Most recent labs look good except for mildly low B12-we can recheck today Mammogram up-to-date COVID booster Shingrix is complete Dexa was 03/2019- can recheck this summer   Fosamax Lovastatin Tramadol as needed B12 OTC  She has cut down on smoking a lot- down to about 1 per day in the am  She smoked a little less than a 1/2 ppd for 20 years or so- does not qualify for screening CT    01/26/2021  10/30/2020   1  Tramadol Hcl 50 Mg Tablet  60.00  15  Je Cop  [id number]  [facility] ([id number])  3/3  20.00 MME  Comm Ins  [place]    12/28/2020  10/30/2020   1  Tramadol Hcl 50 Mg Tablet  60.00  15  Je Cop  [id number]  [facility] ([id number])  2/3  20.00 MME  Comm Ins  [place]    11/29/2020  10/30/2020   1  Tramadol Hcl 50 Mg Tablet  60.00  15  Je Cop  [id number]  [facility] ([id number])  1/3  20.00 MME  Comm Ins  [place]    10/30/2020  10/30/2020   1  Tramadol Hcl 50 Mg Tablet  60.00  15  Je Cop  [id number]  [facility] ([id number])  0/3  20.00 MME  Comm Ins  [place]    10/04/2020  07/05/2020   1  Tramadol Hcl 50 Mg Tablet  60.00  15  Je Cop  [id number]  Wal ([id number])  3/3   20.00 MME  Comm Ins  [place]    09/04/2020  07/05/2020   1  Tramadol Hcl 50 Mg Tablet  60.00  15  Je Cop  [id number]  [facility] ([id number])  2/3  20.00 MME  Comm Ins  [place]    08/03/2020  07/05/2020   1  Tramadol Hcl 50 Mg Tablet  60.00  15  Je Cop  [id number]  Wal ([id number])  1/3  20.00 MME  Comm Ins  [place]    07/05/2020  07/05/2020   1  Tramadol Hcl 50 Mg Tablet  60.00  15  Je Cop          Patient Active Problem List   Diagnosis Date Noted   Osteoporosis 06/17/2017   Vitamin D insufficiency 04/19/2016   Tobacco use disorder 04/19/2016   Absolute anemia 04/19/2016   Breast cancer screening, high risk patient 04/19/2016   Chronic back pain 04/19/2016   Colostomy in place [facility]) 04/19/2016   Rectal stenosis 04/04/2015   Right thyroid nodule 03/07/2014  Slow transit constipation 03/07/2014   Personal history of malignant neoplasm of rectum 2014 02/21/2014   Rectal cancer ([place]) 02/25/2013    Past Medical History:  Diagnosis Date   Arthritis    LUMBAR   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 04-29-2013  currently on oral chemo med---  oncologist-- dr Benay Spice   06-14-2013  s/p anterior resection w/ Diverting ileostomy---   Complication of anesthesia    "shivers"   History of kidney stones    Rectal stenosis    S/P ileostomy ([place])    Wears contact lenses     Past Surgical History:  Procedure Laterality Date   BOWEL RESECTION N/A 08/01/2015   Procedure: SMALL BOWEL RESECTION, takedown of colorectal anastomosis with colostomy;  Surgeon: Leighton Ruff, MD;  Location: [facility];  Service: General;  Laterality: N/A;   BUNIONECTOMY Left [place] N/A 11/18/2013   Procedure: LOOP ILEOSTOMY CLOSURE, FLEXIBLE SIGMOIDOSCOPY;  Surgeon: Leighton Ruff, MD;  Location: [facility];  Service: General;  Laterality: N/A;   EUS N/A 02/25/2013   Procedure: LOWER ENDOSCOPIC ULTRASOUND (EUS);  Surgeon: Milus Banister, MD;  Location:  [facility];  Service: Endoscopy;  Laterality: N/A;  radial   EXAMINATION UNDER ANESTHESIA N/A 07/21/2013   Procedure: EXAM UNDER ANESTHESIA/ RECTAL DILITATION;  Surgeon: Leighton Ruff, MD;  Location: [place];  Service: General;  Laterality: N/A;   [place] N/A 07/21/2013   Procedure: FLEXIBLE SIGMOIDOSCOPY;  Surgeon: Leighton Ruff, MD;  Location: [facility];  Service: General;  Laterality: N/A;   FLEXIBLE SIGMOIDOSCOPY N/A 11/18/2013   Procedure: FLEXIBLE SIGMOIDOSCOPY;  Surgeon: Leighton Ruff, MD;  Location: [facility];  Service: General;  Laterality: N/A;   FLEXIBLE SIGMOIDOSCOPY N/A 08/01/2015   Procedure: FLEXIBLE SIGMOIDOSCOPY with biopsies;  Surgeon: Leighton Ruff, MD;  Location: [facility];  Service: General;  Laterality: N/A;   LAPAROSCOPIC LOW ANTERIOR RESECTION N/A 06/14/2013   Procedure: LAPAROSCOPIC LOW ANTERIOR RESECTION AND DIVERTING LAPAROSCOPIC ILEOSTOMY;  Surgeon: Leighton Ruff, MD;  Location: [facility];  Service: General;  Laterality: N/A;  with splenic flexure takdown   LAPAROSCOPY N/A 08/01/2015   Procedure: LAPAROSCOPY ;  Surgeon: Leighton Ruff, MD;  Location: [facility];  Service: General;  Laterality: N/A;   TUBAL LIGATION  1994    Social History   Tobacco Use   Smoking status: Some Days    Packs/day: 0.50    Years: 30.00    Pack years: 15.00    Types: Cigarettes   Smokeless tobacco: Never   Tobacco comments:    9 cigarettes/ per day. cut back from 2packs per day   Substance Use Topics   Alcohol use: No    Alcohol/week: 0.0 standard drinks   Drug use: No    Family History  Problem Relation Age of Onset   Arthritis Mother    Diabetes Mother    Thyroid disease Mother    Arthritis Father    Diabetes Father    Parkinson's disease Father    Colon cancer Neg Hx    Esophageal cancer Neg Hx    Pancreatic cancer Neg Hx    Rectal cancer Neg Hx    Stomach cancer Neg Hx     Allergies  Allergen Reactions   Demerol  [Meperidine] Nausea Only    Medication list has been reviewed and updated.  Current Outpatient Medications on File  Prior to Visit  Medication Sig Dispense Refill   cholecalciferol (VITAMIN D) 1000 units tablet Take 1,000 Units by mouth daily.     ibuprofen (ADVIL,MOTRIN) 200 MG tablet Take 400 mg by mouth every 6 (six) hours as needed (Pain). Reported on 02/12/2016     lovastatin (MEVACOR) 20 MG tablet Take 1 tablet (20 mg total) by mouth at bedtime. 90 tablet 3   Melatonin 5 MG TABS Take by mouth.     nicotine (NICODERM CQ - DOSED IN MG/24 HOURS) 21 mg/24hr patch Place 1 patch onto the skin daily.     ondansetron (ZOFRAN) 8 MG tablet Take 1 tablet (8 mg total) by mouth every 8 (eight) hours as needed. for nausea 30 tablet 2   OVER THE COUNTER MEDICATION Take 1 capsule by mouth daily. Biotin  tablet one a day - hair, skin nails     vitamin B-12 (CYANOCOBALAMIN) 500 MCG tablet Take 500 mcg by mouth daily.     No current facility-administered medications on file prior to visit.    Review of Systems:  As per HPI- otherwise negative.   Physical Examination: Vitals:   02/01/21 0817 02/01/21 0831  BP: (!) 155/91 132/85  Pulse: 95   Resp: 16   Temp: 98.7 F (37.1 C)   SpO2: 98%    Vitals:   02/01/21 0817  Weight: 117 lb 12.8 oz (53.4 kg)  Height: 5\' 3"  (1.6 m)   Body mass index is 20.87 kg/m. Ideal Body Weight: Weight in (lb) to have BMI = 25: 140.8  GEN: no acute distress. Slim build, looks well  HEENT: Atraumatic, Normocephalic.  Ears and Nose: No external deformity. CV: RRR, No M/G/R. No JVD. No thrill. No extra heart sounds. PULM: CTA B, no wheezes, crackles, rhonchi. No retractions. No resp. distress. No accessory muscle use. ABD: S, NT, ND, +BS. No rebound. No HSM. Permanent ostomy in place - no issues  EXTR: No c/c/e PSYCH: Normally interactive. Conversant.   BP Readings from Last 3 Encounters:  02/01/21 132/85  08/03/20 122/68  03/02/20 122/72    Assessment  and Plan: Rectal cancer ([place])  Age-related osteoporosis without current pathological fracture - Plan: DG Bone Density, alendronate (FOSAMAX) 70 MG tablet  Primary osteoarthritis of both hands - Plan: traMADol (ULTRAM) 50 MG tablet  Screening for diabetes mellitus - Plan: Comprehensive metabolic panel, Hemoglobin A1c  History of anemia - Plan: CBC  Screening for hyperlipidemia - Plan: Lipid panel  Vitamin D deficiency - Plan: VITAMIN D 25 Hydroxy (Vit-D Deficiency, Fractures)  B12 deficiency - Plan: Vitamin B12  Tobacco consumption  Following up today Labs pending Ordered dexa to be done this August Encouraged tobacco cessation Will plan further follow- up pending labs.  This visit occurred during the SARS-CoV-2 public health emergency.  Safety protocols were in place, including screening questions prior to the visit, additional usage of staff PPE, and extensive cleaning of exam room while observing appropriate contact time as indicated for disinfecting solutions.   Signed Lamar Blinks, MD  Received her labs as below, message to patient Results for orders placed or performed in visit on 02/01/21  CBC  Result Value Ref Range   WBC 4.5 4.0 - 10.5 K/uL   RBC 4.85 3.87 - 5.11 Mil/uL   Platelets 295.0 150.0 - 400.0 K/uL   Hemoglobin 15.4 (H) 12.0 - 15.0 g/dL   HCT 45.4 36.0 - 46.0 %   MCV 93.5 78.0 - 100.0 fl   MCHC 33.9 30.0 - 36.0  g/dL   RDW 13.3 11.5 - 15.5 %  Comprehensive metabolic panel  Result Value Ref Range   Sodium 141 135 - 145 mEq/L   Potassium 4.2 3.5 - 5.1 mEq/L   Chloride 104 96 - 112 mEq/L   CO2 30 19 - 32 mEq/L   Glucose, Bld 51 (L) 70 - 99 mg/dL   BUN 13 6 - 23 mg/dL   Creatinine, Ser 0.81 0.40 - 1.20 mg/dL   Total Bilirubin 0.5 0.2 - 1.2 mg/dL   Alkaline Phosphatase 85 39 - 117 U/L   AST 17 0 - 37 U/L   ALT 25 0 - 35 U/L   Total Protein 6.8 6.0 - 8.3 g/dL   Albumin 4.1 3.5 - 5.2 g/dL   GFR 79.58 >60.00 mL/min   Calcium 10.1 8.4 - 10.5 mg/dL   Hemoglobin A1c  Result Value Ref Range   Hgb A1c MFr Bld 5.9 4.6 - 6.5 %  VITAMIN D 25 Hydroxy (Vit-D Deficiency, Fractures)  Result Value Ref Range   VITD 98.87 30.00 - 100.00 ng/mL  Vitamin B12  Result Value Ref Range   Vitamin B-12 402 211 - 911 pg/mL  Lipid panel  Result Value Ref Range   Cholesterol 199 0 - 200 mg/dL   Triglycerides 136.0 0.0 - 149.0 mg/dL   HDL 70.50 >39.00 mg/dL   VLDL 27.2 0.0 - 40.0 mg/dL   LDL Cholesterol 101 (H) 0 - 99 mg/dL   Total CHOL/HDL Ratio 3    NonHDL 128.03    The 10-year ASCVD risk score Mikey Bussing DC Jr., et al., 2013) is: 5.7%   Values used to calculate the score:     Age: 28 years     Sex: Female     Is Non-Hispanic African American: No     Diabetic: No     Tobacco smoker: Yes     Systolic Blood Pressure: 740 mmHg     Is BP treated: No     HDL Cholesterol: 70.5 mg/dL     Total Cholesterol: 199 mg/dL

## 2021-02-01 ENCOUNTER — Ambulatory Visit (INDEPENDENT_AMBULATORY_CARE_PROVIDER_SITE_OTHER): Payer: BLUE CROSS/BLUE SHIELD | Admitting: Family Medicine

## 2021-02-01 ENCOUNTER — Encounter: Payer: Self-pay | Admitting: Family Medicine

## 2021-02-01 ENCOUNTER — Other Ambulatory Visit: Payer: Self-pay

## 2021-02-01 VITALS — BP 132/85 | HR 95 | Temp 98.7°F | Resp 16 | Ht 63.0 in | Wt 117.8 lb

## 2021-02-01 DIAGNOSIS — M19041 Primary osteoarthritis, right hand: Secondary | ICD-10-CM | POA: Diagnosis not present

## 2021-02-01 DIAGNOSIS — M810 Age-related osteoporosis without current pathological fracture: Secondary | ICD-10-CM

## 2021-02-01 DIAGNOSIS — E538 Deficiency of other specified B group vitamins: Secondary | ICD-10-CM

## 2021-02-01 DIAGNOSIS — C20 Malignant neoplasm of rectum: Secondary | ICD-10-CM | POA: Diagnosis not present

## 2021-02-01 DIAGNOSIS — M19042 Primary osteoarthritis, left hand: Secondary | ICD-10-CM

## 2021-02-01 DIAGNOSIS — Z862 Personal history of diseases of the blood and blood-forming organs and certain disorders involving the immune mechanism: Secondary | ICD-10-CM | POA: Diagnosis not present

## 2021-02-01 DIAGNOSIS — E559 Vitamin D deficiency, unspecified: Secondary | ICD-10-CM | POA: Diagnosis not present

## 2021-02-01 DIAGNOSIS — Z720 Tobacco use: Secondary | ICD-10-CM

## 2021-02-01 DIAGNOSIS — Z13220 Encounter for screening for lipoid disorders: Secondary | ICD-10-CM | POA: Diagnosis not present

## 2021-02-01 DIAGNOSIS — Z131 Encounter for screening for diabetes mellitus: Secondary | ICD-10-CM

## 2021-02-01 LAB — CBC
HCT: 45.4 % (ref 36.0–46.0)
Hemoglobin: 15.4 g/dL — ABNORMAL HIGH (ref 12.0–15.0)
MCHC: 33.9 g/dL (ref 30.0–36.0)
MCV: 93.5 fl (ref 78.0–100.0)
Platelets: 295 10*3/uL (ref 150.0–400.0)
RBC: 4.85 Mil/uL (ref 3.87–5.11)
RDW: 13.3 % (ref 11.5–15.5)
WBC: 4.5 10*3/uL (ref 4.0–10.5)

## 2021-02-01 LAB — COMPREHENSIVE METABOLIC PANEL
ALT: 25 U/L (ref 0–35)
AST: 17 U/L (ref 0–37)
Albumin: 4.1 g/dL (ref 3.5–5.2)
Alkaline Phosphatase: 85 U/L (ref 39–117)
BUN: 13 mg/dL (ref 6–23)
CO2: 30 mEq/L (ref 19–32)
Calcium: 10.1 mg/dL (ref 8.4–10.5)
Chloride: 104 mEq/L (ref 96–112)
Creatinine, Ser: 0.81 mg/dL (ref 0.40–1.20)
GFR: 79.58 mL/min (ref >60.00)
Glucose, Bld: 51 mg/dL — ABNORMAL LOW (ref 70–99)
Potassium: 4.2 mEq/L (ref 3.5–5.1)
Sodium: 141 mEq/L (ref 135–145)
Total Bilirubin: 0.5 mg/dL (ref 0.2–1.2)
Total Protein: 6.8 g/dL (ref 6.0–8.3)

## 2021-02-01 LAB — LIPID PANEL
Cholesterol: 199 mg/dL (ref 0–200)
HDL: 70.5 mg/dL (ref >39.00)
LDL Cholesterol: 101 mg/dL — ABNORMAL HIGH (ref 0–99)
NonHDL: 128.03
Total CHOL/HDL Ratio: 3
Triglycerides: 136 mg/dL (ref 0.0–149.0)
VLDL: 27.2 mg/dL (ref 0.0–40.0)

## 2021-02-01 LAB — VITAMIN B12: Vitamin B-12: 402 pg/mL (ref 211–911)

## 2021-02-01 LAB — VITAMIN D 25 HYDROXY (VIT D DEFICIENCY, FRACTURES): VITD: 98.87 ng/mL (ref 30.00–100.00)

## 2021-02-01 LAB — HEMOGLOBIN A1C: Hgb A1c MFr Bld: 5.9 % (ref 4.6–6.5)

## 2021-02-01 MED ORDER — ALENDRONATE SODIUM 70 MG PO TABS
70.0000 mg | ORAL_TABLET | ORAL | 3 refills | Status: DC
Start: 2021-02-01 — End: 2021-11-13

## 2021-02-01 MED ORDER — TRAMADOL HCL 50 MG PO TABS: 50.0000 mg | ORAL_TABLET | Freq: Four times a day (QID) | ORAL | 3 refills | Status: DC | PRN

## 2021-06-15 ENCOUNTER — Encounter: Payer: Self-pay | Admitting: Family Medicine

## 2021-06-15 DIAGNOSIS — C20 Malignant neoplasm of rectum: Secondary | ICD-10-CM

## 2021-06-15 DIAGNOSIS — M19042 Primary osteoarthritis, left hand: Secondary | ICD-10-CM

## 2021-06-15 DIAGNOSIS — M19041 Primary osteoarthritis, right hand: Secondary | ICD-10-CM

## 2021-06-15 MED ORDER — TRAMADOL HCL 50 MG PO TABS: 50.0000 mg | ORAL_TABLET | Freq: Four times a day (QID) | ORAL | 3 refills | Status: DC | PRN

## 2021-06-15 MED ORDER — ONDANSETRON HCL 8 MG PO TABS: 8.0000 mg | ORAL_TABLET | Freq: Three times a day (TID) | ORAL | 2 refills | Status: DC | PRN

## 2021-08-03 NOTE — Progress Notes (Addendum)
[facility] [address], [place], [place] [address] [phone]  Date:  08/06/2021   Name:  Angela Schmidt   DOB:  [birth year]   MRN:  [id number]  PCP:  Darreld Mclean, MD    Chief Complaint: Medical Management of Chronic Issues (6 m f/u )   History of Present Illness:  Angela Schmidt is a 60 y.o. very pleasant female patient who presents with the following:  Pt seen today for a follow-up visit  Last seen by myself in June  She has history of rectal cancer 2014 with permanent ostomy, peripheral artery disease and claudication, arthritis which is most severe in her hands, osteoporosis and vitamin D deficiency taking Fosamax, prediabetes, tobacco use She continues to have arthritis pain- she does use tramadol as needed   At our last visit she had cut down on smoking to about one cig a day  Can offer pneumonia vaccine due to smoking- pt would like to defer  Covid booster- done  Flu vaccine- done   She has smoked for 10- 15 years, off and on. Max intake 1/2 ppd  Labs 6 months ago - CMP, lipid, vit D and B12, CBC and A1c - last a1c was elevated to pre-diabetes range  She notes a family history of diabetes   Fosmax Lovastatin Tramadol  B12 OTC  Vit D otc   Pt notes she was dx with cancer at age 69; menopause seemed to start at that time as well.  She had surgery, chemo and radiation and menses ended  She has noted some difficulty with sleep, feeling hot at night- not really having hot flashes  No PMB Her sx are quite tolerable   Admits she is not getting as much exercise as she should  Patient Active Problem List   Diagnosis Date Noted   Osteoporosis 06/17/2017   Vitamin D insufficiency 04/19/2016   Tobacco use disorder 04/19/2016   Absolute anemia 04/19/2016   Breast cancer screening, high risk patient 04/19/2016   Chronic back pain 04/19/2016   Colostomy in place [facility]) 04/19/2016   Rectal stenosis  04/04/2015   Right thyroid nodule 03/07/2014   Slow transit constipation 03/07/2014   Personal history of malignant neoplasm of rectum 2014 02/21/2014   Rectal cancer ([place]) 02/25/2013    Past Medical History:  Diagnosis Date   Arthritis    LUMBAR   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 04-29-2013  currently on oral chemo med---  oncologist-- dr Benay Spice   06-14-2013  s/p anterior resection w/ Diverting ileostomy---   Complication of anesthesia    "shivers"   History of kidney stones    Rectal stenosis    S/P ileostomy ([place])    Wears contact lenses     Past Surgical History:  Procedure Laterality Date   BOWEL RESECTION N/A 08/01/2015   Procedure: SMALL BOWEL RESECTION, takedown of colorectal anastomosis with colostomy;  Surgeon: Leighton Ruff, MD;  [facility];  Service: General;  Laterality: N/A;   BUNIONECTOMY Left [address] N/A 11/18/2013   Procedure: LOOP ILEOSTOMY CLOSURE, FLEXIBLE SIGMOIDOSCOPY;  Surgeon: Leighton Ruff, MD;  [facility];  Service: General;  Laterality: N/A;   EUS N/A 02/25/2013   Procedure: LOWER ENDOSCOPIC ULTRASOUND (EUS);  Surgeon: Milus Banister, MD;  [facility];  Service: Endoscopy;  Laterality: N/A;  radial   EXAMINATION UNDER ANESTHESIA N/A 07/21/2013   Procedure: EXAM UNDER ANESTHESIA/ RECTAL DILITATION;  Surgeon: Leighton Ruff, MD;  Location: [place];  Service: General;  Laterality: N/A;   [place] N/A 07/21/2013   Procedure: FLEXIBLE SIGMOIDOSCOPY;  Surgeon: Leighton Ruff, MD;  Location: [place];  Service: General;  Laterality: N/A;   FLEXIBLE SIGMOIDOSCOPY N/A 11/18/2013   Procedure: FLEXIBLE SIGMOIDOSCOPY;  Surgeon: Leighton Ruff, MD;  [facility];  Service: General;  Laterality: N/A;   FLEXIBLE SIGMOIDOSCOPY N/A 08/01/2015   Procedure: FLEXIBLE SIGMOIDOSCOPY with  biopsies;  Surgeon: Leighton Ruff, MD;  [facility];  Service: General;  Laterality: N/A;   LAPAROSCOPIC LOW ANTERIOR RESECTION N/A 06/14/2013   Procedure: LAPAROSCOPIC LOW ANTERIOR RESECTION AND DIVERTING LAPAROSCOPIC ILEOSTOMY;  Surgeon: Leighton Ruff, MD;  [facility];  Service: General;  Laterality: N/A;  with splenic flexure takdown   LAPAROSCOPY N/A 08/01/2015   Procedure: LAPAROSCOPY ;  Surgeon: Leighton Ruff, MD;  [facility];  Service: General;  Laterality: N/A;   TUBAL LIGATION  1994    Social History   Tobacco Use   Smoking status: Some Days    Packs/day: 0.50    Years: 30.00    Pack years: 15.00    Types: Cigarettes   Smokeless tobacco: Never   Tobacco comments:    9 cigarettes/ per day. cut back from 2packs per day   Substance Use Topics   Alcohol use: No    Alcohol/week: 0.0 standard drinks   Drug use: No    Family History  Problem Relation Age of Onset   Arthritis Mother    Diabetes Mother    Thyroid disease Mother    Arthritis Father    Diabetes Father    Parkinson's disease Father    Colon cancer Neg Hx    Esophageal cancer Neg Hx    Pancreatic cancer Neg Hx    Rectal cancer Neg Hx    Stomach cancer Neg Hx     Allergies  Allergen Reactions   Demerol [Meperidine] Nausea Only    Medication list has been reviewed and updated.  Current Outpatient Medications on File Prior to Visit  Medication Sig Dispense Refill   alendronate (FOSAMAX) 70 MG tablet Take 1 tablet (70 mg total) by mouth every 7 (seven) days. Take with a full glass of water on an empty stomach. 12 tablet 3   cholecalciferol (VITAMIN D) 1000 units tablet Take 1,000 Units by mouth daily.     ibuprofen (ADVIL,MOTRIN) 200 MG tablet Take 400 mg by mouth every 6 (six) hours as needed (Pain). Reported on 02/12/2016     lovastatin (MEVACOR) 20 MG tablet Take 1 tablet (20 mg total) by mouth at bedtime. 90 tablet 3   Melatonin 5 MG TABS Take by mouth.     ondansetron (ZOFRAN) 8 MG  tablet Take 1 tablet (8 mg total) by mouth every 8 (eight) hours as needed. for nausea 30 tablet 2   traMADol (ULTRAM) 50 MG tablet Take 1 tablet (50 mg total) by mouth every 6 (six) hours as needed. for pain 60 tablet 3   vitamin B-12 (CYANOCOBALAMIN) 500 MCG tablet Take 500 mcg by mouth daily.     nicotine (NICODERM CQ - DOSED IN MG/24 HOURS) 21 mg/24hr patch Place 1 patch onto the skin daily. (Patient not taking: Reported on 08/06/2021)     No current facility-administered medications on file prior to visit.  Review of Systems:  As per HPI- otherwise negative.  Physical Examination: Vitals:   08/06/21 0814  BP: 130/80  Pulse: 94  Temp: (!) 97.5 F (36.4 C)  SpO2: 97%   Vitals:   08/06/21 0814  Weight: 116 lb (52.6 kg)  Height: 5\' 3"  (1.6 m)   Body mass index is 20.55 kg/m. Ideal Body Weight: Weight in (lb) to have BMI = 25: 140.8  GEN: no acute distress. Slim build, looks well  HEENT: Atraumatic, Normocephalic.    Ears and Nose: No external deformity. CV: RRR, No M/G/R. No JVD. No thrill. No extra heart sounds. PULM: CTA B, no wheezes, crackles, rhonchi. No retractions. No resp. distress. No accessory muscle use. ABD: S, NT, ND, ostomy bag. No rebound. No HSM. EXTR: No c/c/e PSYCH: Normally interactive. Conversant.    Assessment and Plan: Rectal cancer ([place])  Primary osteoarthritis of both hands  Age-related osteoporosis without current pathological fracture - Plan: DG Bone Density  Screening for diabetes mellitus - Plan: Basic metabolic panel, Hemoglobin A1c  Sensation of feeling hot - Plan: TSH  Tobacco consumption  Patient seen today for follow-up. Ordered bone density, she plans to do when she gets her next mammogram in the spring Follow-up on prediabetes-she is quite slim, she understands she does not need to lose weight.  However I did encourage her to get back into an exercise routine, consider getting a piece of home exercise equipment so she can  exercise even when it is cold  Discussed her tobacco history.  She does not qualify for lung cancer screening CT.  Continue to encourage her to quit  She has noted symptoms of feeling warm at night, having some more difficulty sleeping.  She wonders if this may be due to menopause.  We discussed this today, symptoms are not overly bothersome.  She does not wish to be on any particular medication..  Suggested that she try melatonin, will check TSH today  Next visit 6 months Signed Lamar Blinks, MD  Received labs as below- message to pt A1c is stable   Results for orders placed or performed in visit on 99/37/16  Basic metabolic panel  Result Value Ref Range   Sodium 141 135 - 145 mEq/L   Potassium 4.3 3.5 - 5.1 mEq/L   Chloride 106 96 - 112 mEq/L   CO2 28 19 - 32 mEq/L   Glucose, Bld 88 70 - 99 mg/dL   BUN 9 6 - 23 mg/dL   Creatinine, Ser 0.83 0.40 - 1.20 mg/dL   GFR 77.00 >60.00 mL/min   Calcium 9.7 8.4 - 10.5 mg/dL  Hemoglobin A1c  Result Value Ref Range   Hgb A1c MFr Bld 5.9 4.6 - 6.5 %  TSH  Result Value Ref Range   TSH 1.18 0.35 - 5.50 uIU/mL

## 2021-08-03 NOTE — Patient Instructions (Addendum)
Good to see you today Recommend the latest covid vaccine if not done already- done!   Please see me in 6 months assuming all is well   Please call [facility] and schedule your bone density- ok to do when you do your next mammo   Address: [address], [name], [place] [address] Phone: [phone]

## 2021-08-06 ENCOUNTER — Encounter: Payer: Self-pay | Admitting: Family Medicine

## 2021-08-06 ENCOUNTER — Ambulatory Visit (INDEPENDENT_AMBULATORY_CARE_PROVIDER_SITE_OTHER): Payer: BLUE CROSS/BLUE SHIELD | Admitting: Family Medicine

## 2021-08-06 VITALS — BP 130/80 | HR 94 | Temp 97.5°F | Ht 63.0 in | Wt 116.0 lb

## 2021-08-06 DIAGNOSIS — R6889 Other general symptoms and signs: Secondary | ICD-10-CM | POA: Diagnosis not present

## 2021-08-06 DIAGNOSIS — M810 Age-related osteoporosis without current pathological fracture: Secondary | ICD-10-CM

## 2021-08-06 DIAGNOSIS — M19041 Primary osteoarthritis, right hand: Secondary | ICD-10-CM

## 2021-08-06 DIAGNOSIS — R7303 Prediabetes: Secondary | ICD-10-CM

## 2021-08-06 DIAGNOSIS — Z131 Encounter for screening for diabetes mellitus: Secondary | ICD-10-CM

## 2021-08-06 DIAGNOSIS — C20 Malignant neoplasm of rectum: Secondary | ICD-10-CM

## 2021-08-06 DIAGNOSIS — Z720 Tobacco use: Secondary | ICD-10-CM

## 2021-08-06 DIAGNOSIS — M19042 Primary osteoarthritis, left hand: Secondary | ICD-10-CM

## 2021-08-06 LAB — BASIC METABOLIC PANEL
BUN: 9 mg/dL (ref 6–23)
CO2: 28 mEq/L (ref 19–32)
Calcium: 9.7 mg/dL (ref 8.4–10.5)
Chloride: 106 mEq/L (ref 96–112)
Creatinine, Ser: 0.83 mg/dL (ref 0.40–1.20)
GFR: 77 mL/min (ref >60.00)
Glucose, Bld: 88 mg/dL (ref 70–99)
Potassium: 4.3 mEq/L (ref 3.5–5.1)
Sodium: 141 mEq/L (ref 135–145)

## 2021-08-06 LAB — TSH: TSH: 1.18 u[IU]/mL (ref 0.35–5.50)

## 2021-08-06 LAB — HEMOGLOBIN A1C: Hgb A1c MFr Bld: 5.9 % (ref 4.6–6.5)

## 2021-09-07 ENCOUNTER — Encounter: Payer: Self-pay | Admitting: Family Medicine

## 2021-09-07 DIAGNOSIS — M810 Age-related osteoporosis without current pathological fracture: Secondary | ICD-10-CM

## 2021-09-07 DIAGNOSIS — Z780 Asymptomatic menopausal state: Secondary | ICD-10-CM

## 2021-09-07 DIAGNOSIS — Z1239 Encounter for other screening for malignant neoplasm of breast: Secondary | ICD-10-CM

## 2021-09-16 ENCOUNTER — Other Ambulatory Visit: Payer: Self-pay | Admitting: Family Medicine

## 2021-09-16 DIAGNOSIS — I739 Peripheral vascular disease, unspecified: Secondary | ICD-10-CM

## 2021-10-04 LAB — HM DEXA SCAN

## 2021-10-04 LAB — HM MAMMOGRAPHY

## 2021-10-08 ENCOUNTER — Encounter: Payer: Self-pay | Admitting: Family Medicine

## 2021-10-08 DIAGNOSIS — M19041 Primary osteoarthritis, right hand: Secondary | ICD-10-CM

## 2021-10-08 MED ORDER — TRAMADOL HCL 50 MG PO TABS: 50.0000 mg | ORAL_TABLET | Freq: Four times a day (QID) | ORAL | 3 refills | Status: DC | PRN

## 2021-10-11 ENCOUNTER — Encounter: Payer: Self-pay | Admitting: Family Medicine

## 2021-11-13 ENCOUNTER — Other Ambulatory Visit: Payer: Self-pay

## 2021-11-13 DIAGNOSIS — M810 Age-related osteoporosis without current pathological fracture: Secondary | ICD-10-CM

## 2021-11-13 MED ORDER — ALENDRONATE SODIUM 70 MG PO TABS: 70.0000 mg | ORAL_TABLET | ORAL | 3 refills | Status: DC

## 2022-02-02 NOTE — Patient Instructions (Incomplete)
Good to see you again today- please see me in about 6 months  Let's try hydroxyzine as needed for anxiety- it can make you a bit drowsy Let me know how it works for you  Pap today alsso Recommend covid booster if not done

## 2022-02-02 NOTE — Progress Notes (Unsigned)
[place] [facility] [address], [place], [place] [address] [phone]  Date:  02/04/2022   Name:  Angela Schmidt   DOB:  July 24, 1961   MRN:  [id number]  PCP:  Darreld Mclean, MD    Chief Complaint: No chief complaint on file.   History of Present Illness:  Angela Schmidt is a 60 y.o. very pleasant female patient who presents with the following:  Pt seen today for follow-up Last seen by myself in December She has history of rectal cancer 2014 with permanent ostomy, peripheral artery disease and claudication, arthritis which is most severe in her hands, osteoporosis and vitamin D deficiency taking Fosamax, prediabetes, tobacco use She continues to have arthritis pain- she does use tramadol as needed  She is a very light smoker- approx 8 pack year history  Labs done   Covid booster Pap Mammo UTD Bone density UTD  Needs labs today   Lovastatin Fosamax Tramadol prn  Lab Results  Component Value Date   HGBA1C 5.9 08/06/2021      Patient Active Problem List   Diagnosis Date Noted   Osteoporosis 06/17/2017   Vitamin D insufficiency 04/19/2016   Tobacco use disorder 04/19/2016   Absolute anemia 04/19/2016   Breast cancer screening, high risk patient 04/19/2016   Chronic back pain 04/19/2016   Colostomy in place [facility]) 04/19/2016   Rectal stenosis 04/04/2015   Right thyroid nodule 03/07/2014   Slow transit constipation 03/07/2014   Personal history of malignant neoplasm of rectum 2014 02/21/2014   Rectal cancer ([place]) 02/25/2013    Past Medical History:  Diagnosis Date   Arthritis    LUMBAR   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 04-29-2013  currently on oral chemo med---  oncologist-- dr Benay Spice   06-14-2013  s/p anterior resection w/ Diverting ileostomy---   Complication of anesthesia    "shivers"   History of kidney stones    Rectal stenosis    S/P ileostomy  ([place])    Wears contact lenses     Past Surgical History:  Procedure Laterality Date   BOWEL RESECTION N/A 08/01/2015   Procedure: SMALL BOWEL RESECTION, takedown of colorectal anastomosis with colostomy;  Surgeon: Leighton Ruff, MD;  [facility];  Service: General;  Laterality: N/A;   BUNIONECTOMY Left 1984   COLOSTOMY     COLOSTOMY     [place] Colostomy   COLOSTOMY REVERSAL N/A 11/18/2013   Procedure: LOOP ILEOSTOMY CLOSURE, FLEXIBLE SIGMOIDOSCOPY;  Surgeon: Leighton Ruff, MD;  [facility];  Service: General;  Laterality: N/A;   EUS N/A 02/25/2013   Procedure: LOWER ENDOSCOPIC ULTRASOUND (EUS);  Surgeon: Milus Banister, MD;  Location: [facility];  Service: Endoscopy;  Laterality: N/A;  radial   EXAMINATION UNDER ANESTHESIA N/A 07/21/2013   Procedure: EXAM UNDER ANESTHESIA/ RECTAL DILITATION;  Surgeon: Leighton Ruff, MD;  Location: [place];  Service: General;  Laterality: N/A;   FLEXIBLE SIGMOIDOSCOPY N/A 07/21/2013   Procedure: FLEXIBLE SIGMOIDOSCOPY;  Surgeon: Leighton Ruff, MD;  Location: [facility];  Service: General;  Laterality: N/A;   FLEXIBLE SIGMOIDOSCOPY N/A 11/18/2013   Procedure: FLEXIBLE SIGMOIDOSCOPY;  Surgeon: Leighton Ruff, MD;  [facility];  Service: General;  Laterality: N/A;   FLEXIBLE SIGMOIDOSCOPY N/A 08/01/2015   Procedure: FLEXIBLE SIGMOIDOSCOPY with biopsies;  Surgeon: Leighton Ruff, MD;  [facility];  Service: General;  Laterality: N/A;   LAPAROSCOPIC  LOW ANTERIOR RESECTION N/A 06/14/2013   Procedure: LAPAROSCOPIC LOW ANTERIOR RESECTION AND DIVERTING LAPAROSCOPIC ILEOSTOMY;  Surgeon: Leighton Ruff, MD;  [facility];  Service: General;  Laterality: N/A;  with splenic flexure takdown   LAPAROSCOPY N/A 08/01/2015   Procedure: LAPAROSCOPY ;  Surgeon: Leighton Ruff, MD;  [facility];  Service: General;  Laterality: N/A;   TUBAL LIGATION  1994    Social History   Tobacco Use   Smoking status: Some Days     Packs/day: 0.50    Years: 30.00    Total pack years: 15.00    Types: Cigarettes   Smokeless tobacco: Never   Tobacco comments:    9 cigarettes/ per day. cut back from 2packs per day   Substance Use Topics   Alcohol use: No    Alcohol/week: 0.0 standard drinks of alcohol   Drug use: No    Family History  Problem Relation Age of Onset   Arthritis Mother    Diabetes Mother    Thyroid disease Mother    Arthritis Father    Diabetes Father    Parkinson's disease Father    Colon cancer Neg Hx    Esophageal cancer Neg Hx    Pancreatic cancer Neg Hx    Rectal cancer Neg Hx    Stomach cancer Neg Hx     Allergies  Allergen Reactions   Demerol [Meperidine] Nausea Only    Medication list has been reviewed and updated.  Current Outpatient Medications on File Prior to Visit  Medication Sig Dispense Refill   lovastatin (MEVACOR) 20 MG tablet TAKE 1 TABLET BY MOUTH AT BEDTIME 90 tablet 1   alendronate (FOSAMAX) 70 MG tablet Take 1 tablet (70 mg total) by mouth every 7 (seven) days. Take with a full glass of water on an empty stomach. 12 tablet 3   cholecalciferol (VITAMIN D) 1000 units tablet Take 1,000 Units by mouth daily.     ibuprofen (ADVIL,MOTRIN) 200 MG tablet Take 400 mg by mouth every 6 (six) hours as needed (Pain). Reported on 02/12/2016     Melatonin 5 MG TABS Take by mouth.     nicotine (NICODERM CQ - DOSED IN MG/24 HOURS) 21 mg/24hr patch Place 1 patch onto the skin daily. (Patient not taking: Reported on 08/06/2021)     ondansetron (ZOFRAN) 8 MG tablet Take 1 tablet (8 mg total) by mouth every 8 (eight) hours as needed. for nausea 30 tablet 2   traMADol (ULTRAM) 50 MG tablet Take 1 tablet (50 mg total) by mouth every 6 (six) hours as needed. for pain 60 tablet 3   vitamin B-12 (CYANOCOBALAMIN) 500 MCG tablet Take 500 mcg by mouth daily.     No current facility-administered medications on file prior to visit.    Review of Systems:  As per HPI- otherwise  negative.   Physical Examination: There were no vitals filed for this visit. There were no vitals filed for this visit. There is no height or weight on file to calculate BMI. Ideal Body Weight:    GEN: no acute distress. HEENT: Atraumatic, Normocephalic.  Ears and Nose: No external deformity. CV: RRR, No M/G/R. No JVD. No thrill. No extra heart sounds. PULM: CTA B, no wheezes, crackles, rhonchi. No retractions. No resp. distress. No accessory muscle use. ABD: S, NT, ND, +BS. No rebound. No HSM. EXTR: No c/c/e PSYCH: Normally interactive. Conversant.    Assessment and Plan: ***  Signed Lamar Blinks, MD

## 2022-02-04 ENCOUNTER — Ambulatory Visit (INDEPENDENT_AMBULATORY_CARE_PROVIDER_SITE_OTHER): Payer: BLUE CROSS/BLUE SHIELD | Admitting: Family Medicine

## 2022-02-04 ENCOUNTER — Encounter: Payer: Self-pay | Admitting: Family Medicine

## 2022-02-04 ENCOUNTER — Other Ambulatory Visit (HOSPITAL_COMMUNITY): Admit: 2022-02-04 | Payer: BLUE CROSS/BLUE SHIELD

## 2022-02-04 VITALS — BP 142/82 | HR 91 | Resp 20 | Ht 63.0 in | Wt 116.0 lb

## 2022-02-04 DIAGNOSIS — M19041 Primary osteoarthritis, right hand: Secondary | ICD-10-CM | POA: Diagnosis not present

## 2022-02-04 DIAGNOSIS — M19042 Primary osteoarthritis, left hand: Secondary | ICD-10-CM

## 2022-02-04 DIAGNOSIS — R7303 Prediabetes: Secondary | ICD-10-CM | POA: Diagnosis not present

## 2022-02-04 DIAGNOSIS — E559 Vitamin D deficiency, unspecified: Secondary | ICD-10-CM | POA: Diagnosis not present

## 2022-02-04 DIAGNOSIS — M810 Age-related osteoporosis without current pathological fracture: Secondary | ICD-10-CM | POA: Diagnosis not present

## 2022-02-04 DIAGNOSIS — E538 Deficiency of other specified B group vitamins: Secondary | ICD-10-CM | POA: Diagnosis not present

## 2022-02-04 DIAGNOSIS — C20 Malignant neoplasm of rectum: Secondary | ICD-10-CM | POA: Diagnosis not present

## 2022-02-04 DIAGNOSIS — Z13220 Encounter for screening for lipoid disorders: Secondary | ICD-10-CM | POA: Diagnosis not present

## 2022-02-04 DIAGNOSIS — Z1329 Encounter for screening for other suspected endocrine disorder: Secondary | ICD-10-CM

## 2022-02-04 DIAGNOSIS — Z124 Encounter for screening for malignant neoplasm of cervix: Secondary | ICD-10-CM

## 2022-02-04 DIAGNOSIS — Z862 Personal history of diseases of the blood and blood-forming organs and certain disorders involving the immune mechanism: Secondary | ICD-10-CM | POA: Diagnosis not present

## 2022-02-04 DIAGNOSIS — F4010 Social phobia, unspecified: Secondary | ICD-10-CM

## 2022-02-04 LAB — COMPREHENSIVE METABOLIC PANEL
ALT: 11 U/L (ref 0–35)
AST: 14 U/L (ref 0–37)
Albumin: 3.9 g/dL (ref 3.5–5.2)
Alkaline Phosphatase: 73 U/L (ref 39–117)
BUN: 11 mg/dL (ref 6–23)
CO2: 29 mEq/L (ref 19–32)
Calcium: 9.8 mg/dL (ref 8.4–10.5)
Chloride: 107 mEq/L (ref 96–112)
Creatinine, Ser: 0.83 mg/dL (ref 0.40–1.20)
GFR: 76.74 mL/min (ref >60.00)
Glucose, Bld: 64 mg/dL — ABNORMAL LOW (ref 70–99)
Potassium: 4.1 mEq/L (ref 3.5–5.1)
Sodium: 142 mEq/L (ref 135–145)
Total Bilirubin: 0.4 mg/dL (ref 0.2–1.2)
Total Protein: 6.6 g/dL (ref 6.0–8.3)

## 2022-02-04 LAB — CBC
HCT: 45.2 % (ref 36.0–46.0)
Hemoglobin: 15.2 g/dL — ABNORMAL HIGH (ref 12.0–15.0)
MCHC: 33.7 g/dL (ref 30.0–36.0)
MCV: 94.5 fl (ref 78.0–100.0)
Platelets: 296 10*3/uL (ref 150.0–400.0)
RBC: 4.78 Mil/uL (ref 3.87–5.11)
RDW: 13.4 % (ref 11.5–15.5)
WBC: 5.4 10*3/uL (ref 4.0–10.5)

## 2022-02-04 LAB — VITAMIN D 25 HYDROXY (VIT D DEFICIENCY, FRACTURES): VITD: 54.5 ng/mL (ref 30.00–100.00)

## 2022-02-04 LAB — HEMOGLOBIN A1C: Hgb A1c MFr Bld: 5.7 % (ref 4.6–6.5)

## 2022-02-04 LAB — VITAMIN B12: Vitamin B-12: 745 pg/mL (ref 211–911)

## 2022-02-04 LAB — LIPID PANEL
Cholesterol: 134 mg/dL (ref 0–200)
HDL: 60.6 mg/dL (ref >39.00)
LDL Cholesterol: 43 mg/dL (ref 0–99)
NonHDL: 73.11
Total CHOL/HDL Ratio: 2
Triglycerides: 153 mg/dL — ABNORMAL HIGH (ref 0.0–149.0)
VLDL: 30.6 mg/dL (ref 0.0–40.0)

## 2022-02-04 LAB — TSH: TSH: 1.29 u[IU]/mL (ref 0.35–5.50)

## 2022-02-04 MED ORDER — TRAMADOL HCL 50 MG PO TABS: 50.0000 mg | ORAL_TABLET | Freq: Four times a day (QID) | ORAL | 3 refills | Status: DC | PRN

## 2022-02-04 MED ORDER — ONDANSETRON HCL 8 MG PO TABS: 8.0000 mg | ORAL_TABLET | Freq: Three times a day (TID) | ORAL | 2 refills | Status: DC | PRN

## 2022-02-04 MED ORDER — HYDROXYZINE HCL 10 MG PO TABS: 10.0000 mg | ORAL_TABLET | Freq: Three times a day (TID) | ORAL | 0 refills | Status: DC | PRN

## 2022-02-05 ENCOUNTER — Encounter: Payer: Self-pay | Admitting: Family Medicine

## 2022-02-05 LAB — CYTOLOGY - PAP
Comment: NEGATIVE
Diagnosis: NEGATIVE
High risk HPV: NEGATIVE

## 2022-03-14 ENCOUNTER — Other Ambulatory Visit: Payer: Self-pay | Admitting: Family Medicine

## 2022-03-14 DIAGNOSIS — I739 Peripheral vascular disease, unspecified: Secondary | ICD-10-CM

## 2022-05-06 ENCOUNTER — Encounter: Payer: Self-pay | Admitting: Internal Medicine

## 2022-06-04 ENCOUNTER — Encounter: Payer: Self-pay | Admitting: Family Medicine

## 2022-06-04 DIAGNOSIS — M19041 Primary osteoarthritis, right hand: Secondary | ICD-10-CM

## 2022-06-04 MED ORDER — TRAMADOL HCL 50 MG PO TABS: 50.0000 mg | ORAL_TABLET | Freq: Four times a day (QID) | ORAL | 3 refills | Status: DC | PRN

## 2022-06-04 NOTE — Telephone Encounter (Signed)
Okay for refill?  

## 2022-08-03 NOTE — Progress Notes (Unsigned)
[place] at [facility] [address], [place], [place] [address] [phone]  Date:  08/07/2022   Name:  Angela Schmidt   DOB:  12-Dec-1960   MRN:  [id number]  PCP:  Darreld Mclean, MD    Chief Complaint: No chief complaint on file.   History of Present Illness:  Angela Schmidt is a 61 y.o. very pleasant female patient who presents with the following:  Pt seen today for a 6 month recheck Last visit with myself was in June She has history of rectal cancer 2014 with permanent ostomy, peripheral artery disease and claudication, arthritis which is most severe in her hands, osteoporosis and vitamin D deficiency taking Fosamax, prediabetes, tobacco use   Colon cancer screening Flu shot Covid booster  Bone density done this year  Mammo in February   Patient Active Problem List   Diagnosis Date Noted   Osteoporosis 06/17/2017   Vitamin D insufficiency 04/19/2016   Tobacco use disorder 04/19/2016   Absolute anemia 04/19/2016   Breast cancer screening, high risk patient 04/19/2016   Chronic back pain 04/19/2016   Colostomy in place [facility]) 04/19/2016   Rectal stenosis 04/04/2015   Right thyroid nodule 03/07/2014   Slow transit constipation 03/07/2014   Personal history of malignant neoplasm of rectum 2014 02/21/2014   Rectal cancer ([place]) 02/25/2013    Past Medical History:  Diagnosis Date   Arthritis    LUMBAR   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 04-29-2013  currently on oral chemo med---  oncologist-- dr Benay Spice   06-14-2013  s/p anterior resection w/ Diverting ileostomy---   Complication of anesthesia    "shivers"   History of kidney stones    Rectal stenosis    S/P ileostomy ([place])    Wears contact lenses     Past Surgical History:  Procedure Laterality Date   BOWEL RESECTION N/A 08/01/2015   Procedure: SMALL BOWEL RESECTION, takedown of colorectal anastomosis with  colostomy;  Surgeon: Leighton Ruff, MD;  [facility];  Service: General;  Laterality: N/A;   BUNIONECTOMY Left 1984   COLOSTOMY     COLOSTOMY     [place] Colostomy   COLOSTOMY REVERSAL N/A 11/18/2013   Procedure: LOOP ILEOSTOMY CLOSURE, FLEXIBLE SIGMOIDOSCOPY;  Surgeon: Leighton Ruff, MD;  [facility];  Service: General;  Laterality: N/A;   EUS N/A 02/25/2013   Procedure: LOWER ENDOSCOPIC ULTRASOUND (EUS);  Surgeon: Milus Banister, MD;  Location: [facility];  Service: Endoscopy;  Laterality: N/A;  radial   EXAMINATION UNDER ANESTHESIA N/A 07/21/2013   Procedure: EXAM UNDER ANESTHESIA/ RECTAL DILITATION;  Surgeon: Leighton Ruff, MD;  Location: [place];  Service: General;  Laterality: N/A;   FLEXIBLE SIGMOIDOSCOPY N/A 07/21/2013   Procedure: FLEXIBLE SIGMOIDOSCOPY;  Surgeon: Leighton Ruff, MD;  Location: [place];  Service: General;  Laterality: N/A;   FLEXIBLE SIGMOIDOSCOPY N/A 11/18/2013   Procedure: FLEXIBLE SIGMOIDOSCOPY;  Surgeon: Leighton Ruff, MD;  [facility];  Service: General;  Laterality: N/A;   FLEXIBLE SIGMOIDOSCOPY N/A 08/01/2015   Procedure: FLEXIBLE SIGMOIDOSCOPY with biopsies;  Surgeon: Leighton Ruff, MD;  [facility];  Service: General;  Laterality: N/A;   LAPAROSCOPIC LOW ANTERIOR RESECTION N/A 06/14/2013   Procedure: LAPAROSCOPIC LOW ANTERIOR RESECTION AND DIVERTING LAPAROSCOPIC ILEOSTOMY;  Surgeon: Leighton Ruff, MD;  [facility];  Service: General;  Laterality: N/A;  with splenic flexure takdown   LAPAROSCOPY  N/A 08/01/2015   Procedure: LAPAROSCOPY ;  Surgeon: Leighton Ruff, MD;  [facility];  Service: General;  Laterality: N/A;   TUBAL LIGATION  1994    Social History   Tobacco Use   Smoking status: Some Days    Packs/day: 0.50    Years: 30.00    Total pack years: 15.00    Types: Cigarettes   Smokeless tobacco: Never   Tobacco comments:    9 cigarettes/ per day. cut back from 2packs per day    Substance Use Topics   Alcohol use: No    Alcohol/week: 0.0 standard drinks of alcohol   Drug use: No    Family History  Problem Relation Age of Onset   Arthritis Mother    Diabetes Mother    Thyroid disease Mother    Arthritis Father    Diabetes Father    Parkinson's disease Father    Colon cancer Neg Hx    Esophageal cancer Neg Hx    Pancreatic cancer Neg Hx    Rectal cancer Neg Hx    Stomach cancer Neg Hx     Allergies  Allergen Reactions   Demerol [Meperidine] Nausea Only    Medication list has been reviewed and updated.  Current Outpatient Medications on File Prior to Visit  Medication Sig Dispense Refill   alendronate (FOSAMAX) 70 MG tablet Take 1 tablet (70 mg total) by mouth every 7 (seven) days. Take with a full glass of water on an empty stomach. 12 tablet 3   cholecalciferol (VITAMIN D) 1000 units tablet Take 1,000 Units by mouth daily.     hydrOXYzine (ATARAX) 10 MG tablet Take 1-2 tablets (10-20 mg total) by mouth 3 (three) times daily as needed. 40 tablet 0   ibuprofen (ADVIL,MOTRIN) 200 MG tablet Take 400 mg by mouth every 6 (six) hours as needed (Pain). Reported on 02/12/2016     lovastatin (MEVACOR) 20 MG tablet TAKE 1 TABLET BY MOUTH AT BEDTIME 90 tablet 1   Melatonin 5 MG TABS Take by mouth.     nicotine (NICODERM CQ - DOSED IN MG/24 HOURS) 21 mg/24hr patch Place 1 patch onto the skin daily.     ondansetron (ZOFRAN) 8 MG tablet Take 1 tablet (8 mg total) by mouth every 8 (eight) hours as needed. for nausea 30 tablet 2   traMADol (ULTRAM) 50 MG tablet Take 1 tablet (50 mg total) by mouth every 6 (six) hours as needed. for pain 60 tablet 3   vitamin B-12 (CYANOCOBALAMIN) 500 MCG tablet Take 500 mcg by mouth daily.     No current facility-administered medications on file prior to visit.    Review of Systems:  As per HPI- otherwise negative.   Physical Examination: There were no vitals filed for this visit. There were no vitals filed for this  visit. There is no height or weight on file to calculate BMI. Ideal Body Weight:    GEN: no acute distress. HEENT: Atraumatic, Normocephalic.  Ears and Nose: No external deformity. CV: RRR, No M/G/R. No JVD. No thrill. No extra heart sounds. PULM: CTA B, no wheezes, crackles, rhonchi. No retractions. No resp. distress. No accessory muscle use. ABD: S, NT, ND, +BS. No rebound. No HSM. EXTR: No c/c/e PSYCH: Normally interactive. Conversant.    Assessment and Plan: ***  Signed Lamar Blinks, MD

## 2022-08-03 NOTE — Patient Instructions (Incomplete)
Good to see you again today Recommend the latest covid if not done already, and consider RSV Please see me in about 6 months assuming all is well  Flu shot today I will be in touch with your labs Let me know how you do with the fluoxetine.  Start on 20 mg and increase to 40 after 2-3 weeks if you feel necessary

## 2022-08-07 ENCOUNTER — Encounter: Payer: Self-pay | Admitting: Family Medicine

## 2022-08-07 ENCOUNTER — Ambulatory Visit (INDEPENDENT_AMBULATORY_CARE_PROVIDER_SITE_OTHER): Payer: BLUE CROSS/BLUE SHIELD | Admitting: Family Medicine

## 2022-08-07 VITALS — BP 124/80 | HR 68 | Temp 97.6°F | Resp 18 | Ht 63.0 in | Wt 111.8 lb

## 2022-08-07 DIAGNOSIS — M810 Age-related osteoporosis without current pathological fracture: Secondary | ICD-10-CM | POA: Diagnosis not present

## 2022-08-07 DIAGNOSIS — Z23 Encounter for immunization: Secondary | ICD-10-CM | POA: Diagnosis not present

## 2022-08-07 DIAGNOSIS — C20 Malignant neoplasm of rectum: Secondary | ICD-10-CM | POA: Diagnosis not present

## 2022-08-07 DIAGNOSIS — R634 Abnormal weight loss: Secondary | ICD-10-CM | POA: Diagnosis not present

## 2022-08-07 DIAGNOSIS — Z5181 Encounter for therapeutic drug level monitoring: Secondary | ICD-10-CM | POA: Diagnosis not present

## 2022-08-07 DIAGNOSIS — E785 Hyperlipidemia, unspecified: Secondary | ICD-10-CM | POA: Diagnosis not present

## 2022-08-07 DIAGNOSIS — F341 Dysthymic disorder: Secondary | ICD-10-CM

## 2022-08-07 LAB — BASIC METABOLIC PANEL
BUN: 10 mg/dL (ref 6–23)
CO2: 29 mEq/L (ref 19–32)
Calcium: 10 mg/dL (ref 8.4–10.5)
Chloride: 106 mEq/L (ref 96–112)
Creatinine, Ser: 0.8 mg/dL (ref 0.40–1.20)
GFR: 79.92 mL/min (ref >60.00)
Glucose, Bld: 76 mg/dL (ref 70–99)
Potassium: 4.6 mEq/L (ref 3.5–5.1)
Sodium: 143 mEq/L (ref 135–145)

## 2022-08-07 LAB — CBC
HCT: 45.7 % (ref 36.0–46.0)
Hemoglobin: 15.5 g/dL — ABNORMAL HIGH (ref 12.0–15.0)
MCHC: 33.8 g/dL (ref 30.0–36.0)
MCV: 93.1 fl (ref 78.0–100.0)
Platelets: 329 10*3/uL (ref 150.0–400.0)
RBC: 4.91 Mil/uL (ref 3.87–5.11)
RDW: 13.4 % (ref 11.5–15.5)
WBC: 5.2 10*3/uL (ref 4.0–10.5)

## 2022-08-07 LAB — TSH: TSH: 1.12 u[IU]/mL (ref 0.35–5.50)

## 2022-08-07 MED ORDER — FLUOXETINE HCL 20 MG PO CAPS: 20.0000 mg | ORAL_CAPSULE | Freq: Every day | ORAL | 3 refills | Status: DC

## 2022-08-07 MED ORDER — ONDANSETRON HCL 8 MG PO TABS: 8.0000 mg | ORAL_TABLET | Freq: Three times a day (TID) | ORAL | 2 refills | Status: DC | PRN

## 2022-08-22 ENCOUNTER — Encounter: Payer: Self-pay | Admitting: Family Medicine

## 2022-10-07 ENCOUNTER — Encounter: Payer: Self-pay | Admitting: Family Medicine

## 2022-10-07 DIAGNOSIS — M19041 Primary osteoarthritis, right hand: Secondary | ICD-10-CM

## 2022-10-07 MED ORDER — TRAMADOL HCL 50 MG PO TABS: 50.0000 mg | ORAL_TABLET | Freq: Four times a day (QID) | ORAL | 3 refills | Status: DC | PRN

## 2022-10-31 LAB — HM COLONOSCOPY

## 2022-11-12 LAB — HM MAMMOGRAPHY

## 2023-02-02 NOTE — Progress Notes (Deleted)
[facility] [address] [place], [place] [address] [phone]  Date:  02/05/2023   Name:  Angela Schmidt   DOB:  01/24/1961   MRN:  [id number]  PCP:  Pearline Cables, MD    Chief Complaint: No chief complaint on file.   History of Present Illness:  Angela Schmidt is a 62 y.o. 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, osteoporosis and vitamin D deficiency taking Fosamax, prediabetes, tobacco use   She did a colonoscopy with [facility] in March  3 adult daughters  Can update labs today    Patient Active Problem List   Diagnosis Date Noted   Osteoporosis 06/17/2017   Vitamin D insufficiency 04/19/2016   Tobacco use disorder 04/19/2016   Absolute anemia 04/19/2016   Breast cancer screening, high risk patient 04/19/2016   Chronic back pain 04/19/2016   Colostomy in place [facility]) 04/19/2016   Rectal stenosis 04/04/2015   Right thyroid nodule 03/07/2014   Slow transit constipation 03/07/2014   Personal history of malignant neoplasm of rectum 2014 02/21/2014   Rectal cancer (HCC) 02/25/2013    Past Medical History:  Diagnosis Date   Arthritis    LUMBAR   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 04-29-2013  currently on oral chemo med---  oncologist-- dr Truett Perna   06-14-2013  s/p anterior resection w/ Diverting ileostomy---   Complication of anesthesia    "shivers"   History of kidney stones    Rectal stenosis    S/P ileostomy (HCC)    Wears contact lenses     Past Surgical History:  Procedure Laterality Date   BOWEL RESECTION N/A 08/01/2015   Procedure: SMALL BOWEL RESECTION, takedown of colorectal anastomosis with colostomy;   Surgeon: Romie Levee, MD;  [facility]: [facility];  Service: General;  Laterality: N/A;   BUNIONECTOMY Left 1984   COLOSTOMY     COLOSTOMY     Perm[facility] Colostomy   COLOSTOMY REVERSAL N/A 11/18/2013   Procedure: LOOP ILEOSTOMY CLOSURE, FLEXIBLE SIGMOIDOSCOPY;  Surgeon: Romie Levee, MD;  [facility];  Service: General;  Laterality: N/A;   EUS N/A 02/25/2013   Procedure: LOWER ENDOSCOPIC ULTRASOUND (EUS);  Surgeon: Rachael Fee, MD;  Location: [facility];  Service: Endoscopy;  Laterality: N/A;  radial   EXAMINATION UNDER ANESTHESIA N/A 07/21/2013   Procedure: EXAM UNDER ANESTHESIA/ RECTAL DILITATION;  Surgeon: Romie Levee, MD;  Location: [facility];  Service: General;  Laterality: N/A;   FLEXIBLE SIGMOIDOSCOPY N/A 07/21/2013   Procedure: FLEXIBLE SIGMOIDOSCOPY;  Surgeon: Romie Levee, MD;  Location: [facility];  Service: General;  Laterality: N/A;   FLEXIBLE SIGMOIDOSCOPY N/A 11/18/2013   Procedure: FLEXIBLE SIGMOIDOSCOPY;  Surgeon: Romie Levee, MD;  [facility];  Service: General;  Laterality: N/A;   FLEXIBLE SIGMOIDOSCOPY N/A 08/01/2015   Procedure: FLEXIBLE SIGMOIDOSCOPY with biopsies;  Surgeon: Romie Levee, MD;  [facility];  Service: General;  Laterality: N/A;   LAPAROSCOPIC LOW ANTERIOR RESECTION N/A 06/14/2013   Procedure: LAPAROSCOPIC LOW ANTERIOR RESECTION AND DIVERTING LAPAROSCOPIC ILEOSTOMY;  Surgeon: Romie Levee, MD;  [facility];  Service: General;  Laterality: N/A;  with splenic flexure takdown  LAPAROSCOPY N/A 08/01/2015   Procedure: LAPAROSCOPY ;  Surgeon: Romie Levee, MD;  [facility];  Service: General;  Laterality: N/A;   TUBAL LIGATION  1994    Social History   Tobacco Use   Smoking status: Some Days    Packs/day: 0.50    Years: 30.00    Additional pack years: 0.00    Total pack years: 15.00    Types: Cigarettes   Smokeless tobacco: Never   Tobacco comments:    9 cigarettes/ per day. cut back from  2packs per day   Substance Use Topics   Alcohol use: No    Alcohol/week: 0.0 standard drinks of alcohol   Drug use: No    Family History  Problem Relation Age of Onset   Arthritis Mother    Diabetes Mother    Thyroid disease Mother    Arthritis Father    Diabetes Father    Parkinson's disease Father    Colon cancer Neg Hx    Esophageal cancer Neg Hx    Pancreatic cancer Neg Hx    Rectal cancer Neg Hx    Stomach cancer Neg Hx     Allergies  Allergen Reactions   Demerol [Meperidine] Nausea Only    Medication list has been reviewed and updated.  Current Outpatient Medications on File Prior to Visit  Medication Sig Dispense Refill   alendronate (FOSAMAX) 70 MG tablet Take 1 tablet (70 mg total) by mouth every 7 (seven) days. Take with a full glass of water on an empty stomach. 12 tablet 3   cholecalciferol (VITAMIN D) 1000 units tablet Take 1,000 Units by mouth daily.     FLUoxetine (PROZAC) 20 MG capsule Take 1 capsule (20 mg total) by mouth daily. May increase to 40 mg after 2 weeks if needed 90 capsule 3   ibuprofen (ADVIL,MOTRIN) 200 MG tablet Take 400 mg by mouth every 6 (six) hours as needed (Pain). Reported on 02/12/2016     lovastatin (MEVACOR) 20 MG tablet TAKE 1 TABLET BY MOUTH AT BEDTIME 90 tablet 1   Melatonin 5 MG TABS Take by mouth.     nicotine (NICODERM CQ - DOSED IN MG/24 HOURS) 21 mg/24hr patch Place 1 patch onto the skin daily.     ondansetron (ZOFRAN) 8 MG tablet Take 1 tablet (8 mg total) by mouth every 8 (eight) hours as needed. for nausea 30 tablet 2   traMADol (ULTRAM) 50 MG tablet Take 1 tablet (50 mg total) by mouth every 6 (six) hours as needed. for pain 60 tablet 3   vitamin B-12 (CYANOCOBALAMIN) 500 MCG tablet Take 500 mcg by mouth daily.     No current facility-administered medications on file prior to visit.    Review of Systems:  As per HPI- otherwise negative.   Physical Examination: There were no vitals filed for this visit. There were  no vitals filed for this visit. There is no height or weight on file to calculate BMI. Ideal Body Weight:    GEN: no acute distress. HEENT: Atraumatic, Normocephalic.  Ears and Nose: No external deformity. CV: RRR, No M/G/R. No JVD. No thrill. No extra heart sounds. PULM: CTA B, no wheezes, crackles, rhonchi. No retractions. No resp. distress. No accessory muscle use. ABD: S, NT, ND, +BS. No rebound. No HSM. EXTR: No c/c/e PSYCH: Normally interactive. Conversant.    Assessment and Plan: ***  Signed Abbe Amsterdam, MD

## 2023-02-04 ENCOUNTER — Telehealth: Payer: Self-pay | Admitting: *Deleted

## 2023-02-04 ENCOUNTER — Encounter: Payer: Self-pay | Admitting: Family Medicine

## 2023-02-04 DIAGNOSIS — M19041 Primary osteoarthritis, right hand: Secondary | ICD-10-CM

## 2023-02-04 DIAGNOSIS — C20 Malignant neoplasm of rectum: Secondary | ICD-10-CM

## 2023-02-04 MED ORDER — ONDANSETRON HCL 8 MG PO TABS: 8.0000 mg | ORAL_TABLET | Freq: Three times a day (TID) | ORAL | 2 refills | Status: DC | PRN

## 2023-02-04 MED ORDER — TRAMADOL HCL 50 MG PO TABS: 50.0000 mg | ORAL_TABLET | Freq: Four times a day (QID) | ORAL | 3 refills | Status: DC | PRN

## 2023-02-04 NOTE — Telephone Encounter (Addendum)
Left message on machine to call back to reschedule 6/19 appointment or do virtual from home or here in office.

## 2023-02-05 ENCOUNTER — Ambulatory Visit: Payer: BLUE CROSS/BLUE SHIELD | Admitting: Family Medicine

## 2023-02-05 DIAGNOSIS — E785 Hyperlipidemia, unspecified: Secondary | ICD-10-CM

## 2023-02-05 DIAGNOSIS — M19041 Primary osteoarthritis, right hand: Secondary | ICD-10-CM

## 2023-02-05 DIAGNOSIS — F341 Dysthymic disorder: Secondary | ICD-10-CM

## 2023-02-05 DIAGNOSIS — C20 Malignant neoplasm of rectum: Secondary | ICD-10-CM

## 2023-02-05 DIAGNOSIS — M810 Age-related osteoporosis without current pathological fracture: Secondary | ICD-10-CM

## 2023-02-18 ENCOUNTER — Ambulatory Visit
Admission: EM | Admit: 2023-02-18 | Discharge: 2023-02-18 | Disposition: A | Discharge status: Home / Self Care | Payer: BLUE CROSS/BLUE SHIELD

## 2023-02-18 DIAGNOSIS — H0014 Chalazion left upper eyelid: Secondary | ICD-10-CM

## 2023-02-18 NOTE — ED Triage Notes (Signed)
Pt reports having a bump in the left upper eyelid x 2 days. Pt states is red and swelling. Denies pain.

## 2023-02-18 NOTE — ED Provider Notes (Signed)
[facility]  Note:  This document was prepared using Conservation officer, historic buildings and may include unintentional dictation errors.  MRN: [id number] DOB: June 27, 1961  Subjective:   Angela Schmidt is a 62 y.o. female presenting for 2-day history of a persistent bump of the left upper eyelid.  She only noticed it from washing her face and observing in the mirror that there was a bump.  Otherwise it does not bother her.  No fever, pain, drainage, vision change.  No trauma to the eye.  She wears contact lenses but switched to glasses.  No current facility-administered medications for this encounter.  Current Outpatient Medications:    alendronate (FOSAMAX) 70 MG tablet, Take 1 tablet (70 mg total) by mouth every 7 (seven) days. Take with a full glass of water on an empty stomach., Disp: 12 tablet, Rfl: 3   cholecalciferol (VITAMIN D) 1000 units tablet, Take 1,000 Units by mouth daily., Disp: , Rfl:    FLUoxetine (PROZAC) 20 MG capsule, Take 1 capsule (20 mg total) by mouth daily. May increase to 40 mg after 2 weeks if needed, Disp: 90 capsule, Rfl: 3   ibuprofen (ADVIL,MOTRIN) 200 MG tablet, Take 400 mg by mouth every 6 (six) hours as needed (Pain). Reported on 02/12/2016, Disp: , Rfl:    lovastatin (MEVACOR) 20 MG tablet, TAKE 1 TABLET BY MOUTH AT BEDTIME, Disp: 90 tablet, Rfl: 1   Melatonin 5 MG TABS, Take by mouth., Disp: , Rfl:    nicotine (NICODERM CQ - DOSED IN MG/24 HOURS) 21 mg/24hr patch, Place 1 patch onto the skin daily., Disp: , Rfl:    ondansetron (ZOFRAN) 8 MG tablet, Take 1 tablet (8 mg total) by mouth every 8 (eight) hours as needed. for nausea, Disp: 30 tablet, Rfl: 2   traMADol (ULTRAM) 50 MG tablet, Take 1 tablet (50 mg total) by mouth every 6 (six) hours as needed. 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 04-29-2013  currently on oral chemo med---  oncologist-- dr Truett Perna   06-14-2013  s/p anterior resection w/ Diverting ileostomy---   Complication of anesthesia    "shivers"   History of kidney stones    Rectal stenosis    S/P ileostomy (HCC)    Wears contact lenses      Past Surgical History:  Procedure Laterality Date   BOWEL RESECTION N/A 08/01/2015   Procedure: SMALL BOWEL RESECTION, takedown of colorectal anastomosis with colostomy;  Surgeon: Romie Levee, MD;  [facility];  Service: General;  Laterality: N/A;   BUNIONECTOMY Left 1984   COLOSTOMY     COLOSTOMY     [facility] Colostomy   COLOSTOMY REVERSAL N/A 11/18/2013   Procedure: LOOP ILEOSTOMY CLOSURE, FLEXIBLE SIGMOIDOSCOPY;  Surgeon: Romie Levee, MD;  [facility];  Service: General;  Laterality: N/A;   EUS N/A 02/25/2013   Procedure: LOWER ENDOSCOPIC ULTRASOUND (EUS);  Surgeon: Rachael Fee, MD;  Location: [facility];  Service: Endoscopy;  Laterality: N/A;  radial   EXAMINATION UNDER ANESTHESIA N/A 07/21/2013   Procedure: EXAM UNDER ANESTHESIA/ RECTAL DILITATION;  Surgeon: Romie Levee, MD;  Location: [facility];  Service: General;  Laterality: N/A;   FLEXIBLE SIGMOIDOSCOPY N/A 07/21/2013   Procedure: FLEXIBLE SIGMOIDOSCOPY;  Surgeon: Romie Levee, MD;  Location: [facility];  Service: General;  Laterality: N/A;   FLEXIBLE SIGMOIDOSCOPY N/A 11/18/2013   Procedure: FLEXIBLE SIGMOIDOSCOPY;  Surgeon: Romie Levee, MD;  [facility];  Service: General;  Laterality: N/A;   FLEXIBLE SIGMOIDOSCOPY N/A 08/01/2015   Procedure: FLEXIBLE SIGMOIDOSCOPY with biopsies;  Surgeon: Romie Levee, MD;  [facility];  Service: General;  Laterality: N/A;   LAPAROSCOPIC LOW ANTERIOR RESECTION N/A 06/14/2013   Procedure:  LAPAROSCOPIC LOW ANTERIOR RESECTION AND DIVERTING LAPAROSCOPIC ILEOSTOMY;  Surgeon: Romie Levee, MD;  [facility];  Service: General;  Laterality: N/A;  with splenic flexure takdown   LAPAROSCOPY N/A 08/01/2015   Procedure: LAPAROSCOPY ;  Surgeon: Romie Levee, MD;  [facility];  Service: General;  Laterality: N/A;   TUBAL LIGATION  1994    Family History  Problem Relation Age of Onset   Arthritis Mother    Diabetes Mother    Thyroid disease Mother    Arthritis Father    Diabetes Father    Parkinson's disease Father    Colon cancer Neg Hx    Esophageal cancer Neg Hx    Pancreatic cancer Neg Hx    Rectal cancer Neg Hx    Stomach cancer Neg Hx     Social History   Tobacco Use   Smoking status: Some Days    Packs/day: 0.50    Years: 30.00    Additional pack years: 0.00    Total pack years: 15.00    Types: Cigarettes   Smokeless tobacco: Never   Tobacco comments:    9 cigarettes/ per day. cut back from 2packs per day   Vaping Use   Vaping Use: Never used  Substance Use Topics   Alcohol use: No    Alcohol/week: 0.0 standard drinks of alcohol   Drug use: No    ROS   Objective:   Vitals: BP (!) 141/91 (BP Location: Right Arm)   Pulse 89   Temp 98 F (36.7 C) (Oral)   Resp 18   LMP 01/17/2013   SpO2 96%   Physical Exam Constitutional:      General: She is not in acute distress.    Appearance: Normal appearance. She is well-developed. She is not ill-appearing, toxic-appearing or diaphoretic.  HENT:     Head: Normocephalic and atraumatic.     Nose: Nose normal.     Mouth/Throat:     Mouth: Mucous membranes are moist.  Eyes:     General: Lids are normal. Lids are everted, no foreign bodies appreciated. Vision grossly intact. No scleral icterus.       Right eye: No foreign body, discharge or hordeolum.        Left eye: No foreign body, discharge or hordeolum.     Extraocular Movements: Extraocular movements intact.     Right eye: Normal  extraocular motion.     Left eye: Normal extraocular motion and no nystagmus.     Conjunctiva/sclera:     Right eye: Right conjunctiva is not injected. No chemosis, exudate or hemorrhage.    Left eye: Left conjunctiva is not injected. No chemosis, exudate or hemorrhage.  Cardiovascular:     Rate and Rhythm: Normal rate.  Pulmonary:     Effort: Pulmonary effort is normal.  Skin:    General: Skin is warm and dry.  Neurological:  General: No focal deficit present.     Mental Status: She is alert and oriented to person, place, and time.  Psychiatric:        Mood and Affect: Mood normal.        Behavior: Behavior normal.     Assessment and Plan :   PDMP not reviewed this encounter.  1. Chalazion left upper eyelid    Low suspicion for an infectious process.  Recommended warm compresses.  Avoid contact lens use while using warm compresses.  Counseled patient on potential for adverse effects with medications prescribed/recommended today, ER and return-to-clinic precautions discussed, patient verbalized understanding.    Wallis Bamberg, [place] 02/18/23 [phone]

## 2023-02-18 NOTE — Discharge Instructions (Signed)
Please make sure you apply warm compresses 3-5 times daily, 5-10 minutes at a time as your schedule allows. If you develop fever, pain, more swelling, then come back for a recheck as this may need a course of oral antibiotics.

## 2023-03-06 NOTE — Patient Instructions (Incomplete)
It was great to see you again today as always, assuming all is well please see me in about 6 months  Try hot compresses for the stye several times a day, and the keflex for 5 days If this is not clearing up in 10 days or so please alert me!   Try taking fluoxetine 40 - if you like this dose I can change your rx  Think about doing a lung cancer screening CT- I am glad to order for you at any point!

## 2023-03-06 NOTE — Progress Notes (Unsigned)
[facility] [address], [address] [place], [place] [address] [phone]  Date:  03/12/2023   Name:  Angela Schmidt   DOB:  1960-10-14   MRN:  [id number]  PCP:  Pearline Cables, MD    Chief Complaint: No chief complaint on file.   History of Present Illness:  Angela Schmidt is a 62 y.o. 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, osteoporosis and vitamin D deficiency taking Fosamax, prediabetes, tobacco use   She has 3 adult daughters I started her on fluoxetine at her last visit as she was feeling somewhat depressed during the winter months  She had a colonoscopy per [facility] in March of this year Mammogram is up-to-date Pap up-to-date Bone density completed last year-taking Fosamax Can update lab work today  Also fluoxetine 20 Lovastatin Tramadol as needed arthritis pain Patient Active Problem List   Diagnosis Date Noted   Osteoporosis 06/17/2017   Vitamin D insufficiency 04/19/2016   Tobacco use disorder 04/19/2016   Absolute anemia 04/19/2016   Breast cancer screening, high risk patient 04/19/2016   Chronic back pain 04/19/2016   Colostomy in place [facility]) 04/19/2016   Rectal stenosis 04/04/2015   Right thyroid nodule 03/07/2014   Slow transit constipation 03/07/2014   Personal history of malignant neoplasm of rectum 2014 02/21/2014   Rectal cancer (HCC) 02/25/2013    Past Medical History:  Diagnosis Date   Arthritis    LUMBAR   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 04-29-2013  currently on oral chemo med---  oncologist-- dr Truett Perna   06-14-2013  s/p anterior resection w/ Diverting ileostomy---    Complication of anesthesia    "shivers"   History of kidney stones    Rectal stenosis    S/P ileostomy (HCC)    Wears contact lenses     Past Surgical History:  Procedure Laterality Date   BOWEL RESECTION N/A 08/01/2015   Procedure: SMALL BOWEL RESECTION, takedown of colorectal anastomosis with colostomy;  Surgeon: Romie Levee, MD;  [facility];  Service: General;  Laterality: N/A;   BUNIONECTOMY Left 1984   COLOSTOMY     COLOSTOMY     [facility] Colostomy   COLOSTOMY REVERSAL N/A 11/18/2013   Procedure: LOOP ILEOSTOMY CLOSURE, FLEXIBLE SIGMOIDOSCOPY;  Surgeon: Romie Levee, MD;  [facility];  Service: General;  Laterality: N/A;   EUS N/A 02/25/2013   Procedure: LOWER ENDOSCOPIC ULTRASOUND (EUS);  Surgeon: Rachael Fee, MD;  Location: [facility];  Service: Endoscopy;  Laterality: N/A;  radial   EXAMINATION UNDER ANESTHESIA N/A 07/21/2013   Procedure: EXAM UNDER ANESTHESIA/ RECTAL DILITATION;  Surgeon: Romie Levee, MD;  Location: [facility];  Service: General;  Laterality: N/A;   FLEXIBLE SIGMOIDOSCOPY N/A 07/21/2013   Procedure: FLEXIBLE SIGMOIDOSCOPY;  Surgeon: Romie Levee, MD;  Location: [facility];  Service: General;  Laterality: N/A;   FLEXIBLE SIGMOIDOSCOPY N/A 11/18/2013   Procedure: FLEXIBLE SIGMOIDOSCOPY;  Surgeon: Romie Levee, MD;  [facility];  Service: General;  Laterality: N/A;   FLEXIBLE SIGMOIDOSCOPY N/A 08/01/2015   Procedure: FLEXIBLE SIGMOIDOSCOPY with biopsies;  Surgeon: Romie Levee, MD;  [facility];  Service: General;  Laterality: N/A;  LAPAROSCOPIC LOW ANTERIOR RESECTION N/A 06/14/2013   Procedure: LAPAROSCOPIC LOW ANTERIOR RESECTION AND DIVERTING LAPAROSCOPIC ILEOSTOMY;  Surgeon: Romie Levee, MD;  [facility];  Service: General;  Laterality: N/A;  with splenic flexure takdown   LAPAROSCOPY N/A 08/01/2015   Procedure: LAPAROSCOPY ;  Surgeon: Romie Levee, MD;  [facility];  Service: General;   Laterality: N/A;   TUBAL LIGATION  1994    Social History   Tobacco Use   Smoking status: Some Days    Current packs/day: 0.50    Average packs/day: 0.5 packs/day for 30.0 years (15.0 ttl pk-yrs)    Types: Cigarettes   Smokeless tobacco: Never   Tobacco comments:    9 cigarettes/ per day. cut back from 2packs per day   Vaping Use   Vaping status: Never Used  Substance Use Topics   Alcohol use: No    Alcohol/week: 0.0 standard drinks of alcohol   Drug use: No    Family History  Problem Relation Age of Onset   Arthritis Mother    Diabetes Mother    Thyroid disease Mother    Arthritis Father    Diabetes Father    Parkinson's disease Father    Colon cancer Neg Hx    Esophageal cancer Neg Hx    Pancreatic cancer Neg Hx    Rectal cancer Neg Hx    Stomach cancer Neg Hx     Allergies  Allergen Reactions   Meperidine Nausea Only and Nausea And Vomiting    Medication list has been reviewed and updated.  Current Outpatient Medications on File Prior to Visit  Medication Sig Dispense Refill   alendronate (FOSAMAX) 70 MG tablet Take 1 tablet (70 mg total) by mouth every 7 (seven) days. Take with a full glass of water on an empty stomach. 12 tablet 3   cholecalciferol (VITAMIN D) 1000 units tablet Take 1,000 Units by mouth daily.     FLUoxetine (PROZAC) 20 MG capsule Take 1 capsule (20 mg total) by mouth daily. May increase to 40 mg after 2 weeks if needed 90 capsule 3   ibuprofen (ADVIL,MOTRIN) 200 MG tablet Take 400 mg by mouth every 6 (six) hours as needed (Pain). Reported on 02/12/2016     lovastatin (MEVACOR) 20 MG tablet TAKE 1 TABLET BY MOUTH AT BEDTIME 90 tablet 1   Melatonin 5 MG TABS Take by mouth.     nicotine (NICODERM CQ - DOSED IN MG/24 HOURS) 21 mg/24hr patch Place 1 patch onto the skin daily.     ondansetron (ZOFRAN) 8 MG tablet Take 1 tablet (8 mg total) by mouth every 8 (eight) hours as needed. for nausea 30 tablet 2   traMADol (ULTRAM) 50 MG tablet Take 1  tablet (50 mg total) by mouth every 6 (six) hours as needed. for pain 60 tablet 3   vitamin B-12 (CYANOCOBALAMIN) 500 MCG tablet Take 500 mcg by mouth daily.     No current facility-administered medications on file prior to visit.    Review of Systems:  As per HPI- otherwise negative.   Physical Examination: There were no vitals filed for this visit. There were no vitals filed for this visit. There is no height or weight on file to calculate BMI. Ideal Body Weight:    GEN: no acute distress. HEENT: Atraumatic, Normocephalic.  Ears and Nose: No external deformity. CV: RRR, No M/G/R. No JVD. No thrill. No extra heart sounds. PULM: CTA B, no wheezes, crackles, rhonchi. No retractions. No resp. distress.  No accessory muscle use. ABD: S, NT, ND, +BS. No rebound. No HSM. EXTR: No c/c/e PSYCH: Normally interactive. Conversant.    Assessment and Plan: ***  Signed Abbe Amsterdam, MD

## 2023-03-12 ENCOUNTER — Encounter: Payer: Self-pay | Admitting: Family Medicine

## 2023-03-12 ENCOUNTER — Ambulatory Visit (INDEPENDENT_AMBULATORY_CARE_PROVIDER_SITE_OTHER): Payer: BLUE CROSS/BLUE SHIELD | Admitting: Family Medicine

## 2023-03-12 VITALS — BP 126/82 | HR 88 | Temp 98.6°F | Resp 18 | Ht 63.0 in | Wt 112.6 lb

## 2023-03-12 DIAGNOSIS — R5383 Other fatigue: Secondary | ICD-10-CM

## 2023-03-12 DIAGNOSIS — E559 Vitamin D deficiency, unspecified: Secondary | ICD-10-CM

## 2023-03-12 DIAGNOSIS — H00014 Hordeolum externum left upper eyelid: Secondary | ICD-10-CM

## 2023-03-12 DIAGNOSIS — E785 Hyperlipidemia, unspecified: Secondary | ICD-10-CM | POA: Diagnosis not present

## 2023-03-12 DIAGNOSIS — Z1329 Encounter for screening for other suspected endocrine disorder: Secondary | ICD-10-CM

## 2023-03-12 DIAGNOSIS — C20 Malignant neoplasm of rectum: Secondary | ICD-10-CM

## 2023-03-12 DIAGNOSIS — Z862 Personal history of diseases of the blood and blood-forming organs and certain disorders involving the immune mechanism: Secondary | ICD-10-CM | POA: Diagnosis not present

## 2023-03-12 DIAGNOSIS — I739 Peripheral vascular disease, unspecified: Secondary | ICD-10-CM

## 2023-03-12 DIAGNOSIS — R7303 Prediabetes: Secondary | ICD-10-CM | POA: Diagnosis not present

## 2023-03-12 DIAGNOSIS — M19041 Primary osteoarthritis, right hand: Secondary | ICD-10-CM

## 2023-03-12 DIAGNOSIS — M810 Age-related osteoporosis without current pathological fracture: Secondary | ICD-10-CM

## 2023-03-12 DIAGNOSIS — F341 Dysthymic disorder: Secondary | ICD-10-CM

## 2023-03-12 LAB — COMPREHENSIVE METABOLIC PANEL
ALT: 11 U/L (ref 0–35)
AST: 13 U/L (ref 0–37)
Albumin: 3.8 g/dL (ref 3.5–5.2)
Alkaline Phosphatase: 74 U/L (ref 39–117)
BUN: 11 mg/dL (ref 6–23)
CO2: 26 mEq/L (ref 19–32)
Calcium: 9.6 mg/dL (ref 8.4–10.5)
Chloride: 107 mEq/L (ref 96–112)
Creatinine, Ser: 0.74 mg/dL (ref 0.40–1.20)
GFR: 87.39 mL/min (ref >60.00)
Glucose, Bld: 70 mg/dL (ref 70–99)
Potassium: 4.1 mEq/L (ref 3.5–5.1)
Sodium: 140 mEq/L (ref 135–145)
Total Bilirubin: 0.5 mg/dL (ref 0.2–1.2)
Total Protein: 6.3 g/dL (ref 6.0–8.3)

## 2023-03-12 LAB — LIPID PANEL
Cholesterol: 184 mg/dL (ref 0–200)
HDL: 58.1 mg/dL (ref >39.00)
LDL Cholesterol: 89 mg/dL (ref 0–99)
NonHDL: 126.03
Total CHOL/HDL Ratio: 3
Triglycerides: 187 mg/dL — ABNORMAL HIGH (ref 0.0–149.0)
VLDL: 37.4 mg/dL (ref 0.0–40.0)

## 2023-03-12 LAB — CBC
HCT: 46.5 % — ABNORMAL HIGH (ref 36.0–46.0)
Hemoglobin: 15.2 g/dL — ABNORMAL HIGH (ref 12.0–15.0)
MCHC: 32.7 g/dL (ref 30.0–36.0)
MCV: 95.3 fl (ref 78.0–100.0)
Platelets: 317 10*3/uL (ref 150.0–400.0)
RBC: 4.88 Mil/uL (ref 3.87–5.11)
RDW: 13.5 % (ref 11.5–15.5)
WBC: 4.8 10*3/uL (ref 4.0–10.5)

## 2023-03-12 LAB — VITAMIN D 25 HYDROXY (VIT D DEFICIENCY, FRACTURES): VITD: 58.15 ng/mL (ref 30.00–100.00)

## 2023-03-12 LAB — HEMOGLOBIN A1C: Hgb A1c MFr Bld: 5.7 % (ref 4.6–6.5)

## 2023-03-12 LAB — TSH: TSH: 1.34 u[IU]/mL (ref 0.35–5.50)

## 2023-03-12 MED ORDER — CEPHALEXIN 500 MG PO CAPS
500.0000 mg | ORAL_CAPSULE | Freq: Three times a day (TID) | ORAL | 0 refills | Status: DC
Start: 2023-03-12 — End: 2023-09-13

## 2023-03-14 MED ORDER — LOVASTATIN 20 MG PO TABS
20.0000 mg | ORAL_TABLET | Freq: Every day | ORAL | 1 refills | Status: DC
Start: 2023-03-14 — End: 2023-09-02

## 2023-05-10 ENCOUNTER — Other Ambulatory Visit: Payer: Self-pay | Admitting: Family Medicine

## 2023-05-10 DIAGNOSIS — M810 Age-related osteoporosis without current pathological fracture: Secondary | ICD-10-CM

## 2023-06-06 ENCOUNTER — Encounter: Payer: Self-pay | Admitting: Family Medicine

## 2023-06-06 DIAGNOSIS — M19041 Primary osteoarthritis, right hand: Secondary | ICD-10-CM

## 2023-06-06 MED ORDER — TRAMADOL HCL 50 MG PO TABS
50.0000 mg | ORAL_TABLET | Freq: Four times a day (QID) | ORAL | 3 refills | Status: DC | PRN
Start: 2023-06-06 — End: 2023-09-15

## 2023-09-02 ENCOUNTER — Other Ambulatory Visit: Payer: Self-pay | Admitting: Family Medicine

## 2023-09-02 DIAGNOSIS — I739 Peripheral vascular disease, unspecified: Secondary | ICD-10-CM

## 2023-09-13 NOTE — Patient Instructions (Incomplete)
It was great to see you again today, I will be in touch with your lab work  Recommend COVID booster if not done the last 6 months or so, assuming all is well please see me in July  You can have a bone density done this spring- perhaps at the same time you do your mammo.  Please let me know if you need me to place an order with [facility] for you

## 2023-09-13 NOTE — Progress Notes (Unsigned)
[facility] at [facility] [address] [place], [place] [address] [phone]  Date:  09/15/2023   Name:  Angela Schmidt   DOB:  [birth year]   MRN:  [id number]  PCP:  Pearline Cables, MD    Chief Complaint: No chief complaint on file.   History of Present Illness:  Angela Schmidt is a 63 y.o. very pleasant female patient who presents with the following:  Patient seen today for periodic follow-up.  Most recent visit with myself was in July She has history of rectal cancer 2014 with permanent ostomy, peripheral artery disease and claudication, arthritis which is most severe in her hands, osteoporosis and vitamin D deficiency taking Fosamax, prediabetes, tobacco use, history of B12 deficiency   She has 3 adult daughters  At our visit in July we went up on fluoxetine to 40 mg Most recent colonoscopy at [facility], March 2024 Mammogram due in March Pap 2023 negative Bone density February 2023, can update next month.  She is taking Fosamax for osteoporosis  Blood work done in July including CMP, lipid, vitamin D, CBC, A1c, TSH  Fosamax Vitamin D Fluoxetine 40 mg Lovastatin 20 Tramadol as needed pain   Patient Active Problem List   Diagnosis Date Noted   Osteoporosis 06/17/2017   Vitamin D insufficiency 04/19/2016   Tobacco use disorder 04/19/2016   Absolute anemia 04/19/2016   Breast cancer screening, high risk patient 04/19/2016   Chronic back pain 04/19/2016   Colostomy in place [facility]) 04/19/2016   Rectal stenosis 04/04/2015   Right thyroid nodule 03/07/2014   Slow transit constipation 03/07/2014   Personal history of malignant neoplasm of rectum 2014 02/21/2014   Rectal cancer (HCC) 02/25/2013    Past Medical History:  Diagnosis Date   Arthritis    LUMBAR   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 04-29-2013  currently on oral chemo med---  oncologist-- dr Truett Perna    06-14-2013  s/p anterior resection w/ Diverting ileostomy---   Complication of anesthesia    "shivers"   History of kidney stones    Rectal stenosis    S/P ileostomy (HCC)    Wears contact lenses     Past Surgical History:  Procedure Laterality Date   BOWEL RESECTION N/A 08/01/2015   Procedure: SMALL BOWEL RESECTION, takedown of colorectal anastomosis with colostomy;  Surgeon: Romie Levee, MD;  Location: [facility];  Service: General;  Laterality: N/A;   BUNIONECTOMY Left 1984   COLOSTOMY     COLOSTOMY     [facility] Colostomy   COLOSTOMY REVERSAL N/A 11/18/2013   Procedure: LOOP ILEOSTOMY CLOSURE, FLEXIBLE SIGMOIDOSCOPY;  Surgeon: Romie Levee, MD;  Location: [facility];  Service: General;  Laterality: N/A;   EUS N/A 02/25/2013   Procedure: LOWER ENDOSCOPIC ULTRASOUND (EUS);  Surgeon: Rachael Fee, MD;  Location: [facility];  Service: Endoscopy;  Laterality: N/A;  radial   EXAMINATION UNDER ANESTHESIA N/A 07/21/2013   Procedure: EXAM UNDER ANESTHESIA/ RECTAL DILITATION;  Surgeon: Romie Levee, MD;  Location: [facility];  Service: General;  Laterality: N/A;   FLEXIBLE SIGMOIDOSCOPY N/A 07/21/2013   Procedure: FLEXIBLE SIGMOIDOSCOPY;  Surgeon: Romie Levee, MD;  Location: [facility];  Service: General;  Laterality: N/A;   FLEXIBLE SIGMOIDOSCOPY N/A 11/18/2013   Procedure: FLEXIBLE SIGMOIDOSCOPY;  Surgeon: Romie Levee, MD;  Location: [facility];  Service: General;  Laterality: N/A;   FLEXIBLE SIGMOIDOSCOPY N/A 08/01/2015  Procedure: FLEXIBLE SIGMOIDOSCOPY with biopsies;  Surgeon: Romie Levee, MD;  Location: [facility];  Service: General;  Laterality: N/A;   LAPAROSCOPIC LOW ANTERIOR RESECTION N/A 06/14/2013   Procedure: LAPAROSCOPIC LOW ANTERIOR RESECTION AND DIVERTING LAPAROSCOPIC ILEOSTOMY;  Surgeon: Romie Levee, MD;  Location: [facility];  Service: General;  Laterality: N/A;  with splenic flexure takdown   LAPAROSCOPY N/A 08/01/2015   Procedure: LAPAROSCOPY ;   Surgeon: Romie Levee, MD;  Location: [facility];  Service: General;  Laterality: N/A;   TUBAL LIGATION  1994    Social History   Tobacco Use   Smoking status: Some Days    Current packs/day: 0.50    Average packs/day: 0.5 packs/day for 30.0 years (15.0 ttl pk-yrs)    Types: Cigarettes   Smokeless tobacco: Never   Tobacco comments:    9 cigarettes/ per day. cut back from 2packs per day   Vaping Use   Vaping status: Never Used  Substance Use Topics   Alcohol use: No    Alcohol/week: 0.0 standard drinks of alcohol   Drug use: No    Family History  Problem Relation Age of Onset   Arthritis Mother    Diabetes Mother    Thyroid disease Mother    Arthritis Father    Diabetes Father    Parkinson's disease Father    Colon cancer Neg Hx    Esophageal cancer Neg Hx    Pancreatic cancer Neg Hx    Rectal cancer Neg Hx    Stomach cancer Neg Hx     Allergies  Allergen Reactions   Meperidine Nausea Only and Nausea And Vomiting    Medication list has been reviewed and updated.  Current Outpatient Medications on File Prior to Visit  Medication Sig Dispense Refill   lovastatin (MEVACOR) 20 MG tablet Take 1 tablet (20 mg total) by mouth at bedtime. 90 tablet 0   alendronate (FOSAMAX) 70 MG tablet Take 1 tablet (70 mg total) by mouth once a week. Take with full glass of water on empty stomach 12 tablet 3   cephALEXin (KEFLEX) 500 MG capsule Take 1 capsule (500 mg total) by mouth 3 (three) times daily. 15 capsule 0   cholecalciferol (VITAMIN D) 1000 units tablet Take 1,000 Units by mouth daily.     FLUoxetine (PROZAC) 20 MG capsule Take 1 capsule (20 mg total) by mouth daily. May increase to 40 mg after 2 weeks if needed 90 capsule 3   ibuprofen (ADVIL,MOTRIN) 200 MG tablet Take 400 mg by mouth every 6 (six) hours as needed (Pain). Reported on 02/12/2016     Melatonin 5 MG TABS Take by mouth.     nicotine (NICODERM CQ - DOSED IN MG/24 HOURS) 21 mg/24hr patch Place 1 patch onto the skin  daily.     ondansetron (ZOFRAN) 8 MG tablet Take 1 tablet (8 mg total) by mouth every 8 (eight) hours as needed. for nausea 30 tablet 2   traMADol (ULTRAM) 50 MG tablet Take 1 tablet (50 mg total) by mouth every 6 (six) hours as needed. for pain 60 tablet 3   vitamin B-12 (CYANOCOBALAMIN) 500 MCG tablet Take 500 mcg by mouth daily.     No current facility-administered medications on file prior to visit.    Review of Systems:  As per HPI- otherwise negative.   Physical Examination: There were no vitals filed for this visit. There were no vitals filed for this visit. There is no height or weight on file to calculate BMI.  Ideal Body Weight:    GEN: no acute distress. HEENT: Atraumatic, Normocephalic.  Ears and Nose: No external deformity. CV: RRR, No M/G/R. No JVD. No thrill. No extra heart sounds. PULM: CTA B, no wheezes, crackles, rhonchi. No retractions. No resp. distress. No accessory muscle use. ABD: S, NT, ND, +BS. No rebound. No HSM. EXTR: No c/c/e PSYCH: Normally interactive. Conversant.    Assessment and Plan: ***  Signed Abbe Amsterdam, MD

## 2023-09-15 ENCOUNTER — Ambulatory Visit (INDEPENDENT_AMBULATORY_CARE_PROVIDER_SITE_OTHER): Payer: BLUE CROSS/BLUE SHIELD | Admitting: Family Medicine

## 2023-09-15 VITALS — BP 124/80 | HR 75 | Temp 98.3°F | Resp 18 | Ht 63.0 in | Wt 118.4 lb

## 2023-09-15 DIAGNOSIS — Z933 Colostomy status: Secondary | ICD-10-CM | POA: Diagnosis not present

## 2023-09-15 DIAGNOSIS — C20 Malignant neoplasm of rectum: Secondary | ICD-10-CM

## 2023-09-15 DIAGNOSIS — Z862 Personal history of diseases of the blood and blood-forming organs and certain disorders involving the immune mechanism: Secondary | ICD-10-CM

## 2023-09-15 DIAGNOSIS — E538 Deficiency of other specified B group vitamins: Secondary | ICD-10-CM

## 2023-09-15 DIAGNOSIS — R7303 Prediabetes: Secondary | ICD-10-CM

## 2023-09-15 DIAGNOSIS — M19042 Primary osteoarthritis, left hand: Secondary | ICD-10-CM

## 2023-09-15 DIAGNOSIS — F341 Dysthymic disorder: Secondary | ICD-10-CM

## 2023-09-15 DIAGNOSIS — M810 Age-related osteoporosis without current pathological fracture: Secondary | ICD-10-CM

## 2023-09-15 DIAGNOSIS — M19041 Primary osteoarthritis, right hand: Secondary | ICD-10-CM

## 2023-09-15 DIAGNOSIS — I739 Peripheral vascular disease, unspecified: Secondary | ICD-10-CM

## 2023-09-15 DIAGNOSIS — Z85048 Personal history of other malignant neoplasm of rectum, rectosigmoid junction, and anus: Secondary | ICD-10-CM

## 2023-09-15 MED ORDER — LOVASTATIN 20 MG PO TABS: 20.0000 mg | ORAL_TABLET | Freq: Every day | ORAL | 3 refills | Status: AC

## 2023-09-15 MED ORDER — FLUOXETINE HCL 20 MG PO CAPS: 40.0000 mg | ORAL_CAPSULE | Freq: Every day | ORAL | 3 refills | Status: DC

## 2023-09-15 MED ORDER — TRAMADOL HCL 50 MG PO TABS: 50.0000 mg | ORAL_TABLET | Freq: Four times a day (QID) | ORAL | 3 refills | Status: DC | PRN

## 2023-09-15 MED ORDER — ONDANSETRON HCL 8 MG PO TABS: 8.0000 mg | ORAL_TABLET | Freq: Three times a day (TID) | ORAL | 2 refills | Status: DC | PRN

## 2023-10-16 ENCOUNTER — Encounter: Payer: Self-pay | Admitting: Family Medicine

## 2023-10-16 DIAGNOSIS — E2839 Other primary ovarian failure: Secondary | ICD-10-CM

## 2023-10-16 DIAGNOSIS — Z13820 Encounter for screening for osteoporosis: Secondary | ICD-10-CM

## 2023-10-20 NOTE — Addendum Note (Signed)
 Addended by: CREFT, TIERRA L on: 10/20/2023 12:00 PM   Modules accepted: Orders

## 2023-10-22 NOTE — Addendum Note (Signed)
 Addended by: CREFT, Melton Alar L on: 10/22/2023 10:51 AM   Modules accepted: Orders

## 2023-11-14 LAB — HM MAMMOGRAPHY

## 2023-11-14 LAB — HM DEXA SCAN

## 2023-11-18 ENCOUNTER — Encounter: Payer: Self-pay | Admitting: Family Medicine

## 2024-02-04 ENCOUNTER — Encounter: Payer: Self-pay | Admitting: Family Medicine

## 2024-02-04 DIAGNOSIS — M19041 Primary osteoarthritis, right hand: Secondary | ICD-10-CM

## 2024-02-04 MED ORDER — TRAMADOL HCL 50 MG PO TABS: 50.0000 mg | ORAL_TABLET | Freq: Four times a day (QID) | ORAL | 3 refills | Status: DC | PRN

## 2024-03-04 NOTE — Progress Notes (Signed)
 [facility] at [facility] [address] [place], [place] [address] [phone]  Date:  03/08/2024   Name:  Angela Schmidt   DOB:  09/24/1960   MRN:  [id number]  PCP:  Angela Harlene BROCKS, MD    Chief Complaint: Follow-up (No concerns )   History of Present Illness:  Angela Schmidt is a 63 y.o. very pleasant female patient who presents with the following:  Patient seen today for periodic visit.  Most recent visit with myself was in January  She has history of rectal cancer 2014 with permanent ostomy, peripheral artery disease and claudication, arthritis which is most severe in her hands, osteoporosis and vitamin D  deficiency taking Fosamax , prediabetes, tobacco use, history of B12 deficiency   She has 3 adult daughters- everyone is doing well  She has a colonoscopy every 5 years, most recent March 2024 Mammogram up-to-date Pap is up-to-date Bone density scan in March of this year did show osteoporosis.  The scan was done on different machines this time compared with 2023 so difficult to look for change with Fosamax  Labs done 1 year ago-can update today  Fosamax  Vitamin D  Fluoxetine  40 mg- working well for her  Lovastatin  20 Tramadol  as needed pain  BP Readings from Last 3 Encounters:  03/08/24 (!) 145/85  09/15/23 124/80  03/12/23 126/82   Wt Readings from Last 3 Encounters:  03/08/24 119 lb (54 kg)  09/15/23 118 lb 6.4 oz (53.7 kg)  03/12/23 112 lb 9.6 oz (51.1 kg)   Offered a CT coronary and/ or lung cancer screening CT- she declines today  Enjoys hiking for exercise She is at about 15 pack years now- smokes less than 1/2 PPD    Patient Active Problem List   Diagnosis Date Noted   Osteoporosis 06/17/2017   Vitamin D  insufficiency 04/19/2016   Tobacco use disorder 04/19/2016   Absolute anemia 04/19/2016   Breast cancer screening, high risk patient 04/19/2016   Chronic back pain 04/19/2016   Colostomy in place  [facility]) 04/19/2016   Rectal stenosis 04/04/2015   Right thyroid  nodule 03/07/2014   Slow transit constipation 03/07/2014   Personal history of malignant neoplasm of rectum 2014 02/21/2014   Rectal cancer (HCC) 02/25/2013    Past Medical History:  Diagnosis Date   Arthritis    LUMBAR   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 04-29-2013  currently on oral chemo med---  oncologist-- dr Angela Schmidt   06-14-2013  s/p anterior resection w/ Diverting ileostomy---   Complication of anesthesia    shivers   History of kidney stones    Rectal stenosis    S/P ileostomy (HCC)    Wears contact lenses     Past Surgical History:  Procedure Laterality Date   BOWEL RESECTION N/A 08/01/2015   Procedure: SMALL BOWEL RESECTION, takedown of colorectal anastomosis with colostomy;  Surgeon: Angela Ned, MD;  [facility];  Service: General;  Laterality: N/A;   BUNIONECTOMY Left 1984   COLOSTOMY     COLOSTOMY     [facility] Colostomy   COLOSTOMY REVERSAL N/A 11/18/2013   Procedure: LOOP ILEOSTOMY CLOSURE, FLEXIBLE SIGMOIDOSCOPY;  Surgeon: Angela Ned, MD;  [facility];  Service: General;  Laterality: N/A;   EUS N/A 02/25/2013   Procedure: LOWER ENDOSCOPIC ULTRASOUND (EUS);  Surgeon: Angela SHAUNNA Cedar, MD;  [facility];  Service: Endoscopy;  Laterality: N/A;  radial   EXAMINATION UNDER ANESTHESIA  N/A 07/21/2013   Procedure: EXAM UNDER ANESTHESIA/ RECTAL DILITATION;  Surgeon: Angela Ned, MD;  Location: [facility];  Service: General;  Laterality: N/A;   FLEXIBLE SIGMOIDOSCOPY N/A 07/21/2013   Procedure: FLEXIBLE SIGMOIDOSCOPY;  Surgeon: Angela Ned, MD;  Location: [facility];  Service: General;  Laterality: N/A;   FLEXIBLE SIGMOIDOSCOPY N/A 11/18/2013   Procedure: FLEXIBLE SIGMOIDOSCOPY;  Surgeon: Angela Ned, MD;  [facility];  Service: General;  Laterality: N/A;   FLEXIBLE SIGMOIDOSCOPY N/A 08/01/2015    Procedure: FLEXIBLE SIGMOIDOSCOPY with biopsies;  Surgeon: Angela Ned, MD;  [facility];  Service: General;  Laterality: N/A;   LAPAROSCOPIC LOW ANTERIOR RESECTION N/A 06/14/2013   Procedure: LAPAROSCOPIC LOW ANTERIOR RESECTION AND DIVERTING LAPAROSCOPIC ILEOSTOMY;  Surgeon: Angela Ned, MD;  Location: [facility];  Service: General;  Laterality: N/A;  with splenic flexure takdown   LAPAROSCOPY N/A 08/01/2015   Procedure: LAPAROSCOPY ;  Surgeon: Angela Ned, MD;  Location: [facility];  Service: General;  Laterality: N/A;   TUBAL LIGATION  1994    Social History   Tobacco Use   Smoking status: Some Days    Current packs/day: 0.50    Average packs/day: 0.5 packs/day for 30.0 years (15.0 ttl pk-yrs)    Types: Cigarettes   Smokeless tobacco: Never   Tobacco comments:    9 cigarettes/ per day. cut back from 2packs per day   Vaping Use   Vaping status: Never Used  Substance Use Topics   Alcohol use: No    Alcohol/week: 0.0 standard drinks of alcohol   Drug use: No    Family History  Problem Relation Age of Onset   Arthritis Mother    Diabetes Mother    Thyroid  disease Mother    Arthritis Father    Diabetes Father    Parkinson's disease Father    Colon cancer Neg Hx    Esophageal cancer Neg Hx    Pancreatic cancer Neg Hx    Rectal cancer Neg Hx    Stomach cancer Neg Hx     Allergies  Allergen Reactions   Meperidine Nausea Only and Nausea And Vomiting    Medication list has been reviewed and updated.  Current Outpatient Medications on File Prior to Visit  Medication Sig Dispense Refill   alendronate  (FOSAMAX ) 70 MG tablet Take 1 tablet (70 mg total) by mouth once a week. Take with full glass of water on empty stomach 12 tablet 3   cholecalciferol (VITAMIN D ) 1000 units tablet Take 1,000 Units by mouth daily.     FLUoxetine  (PROZAC ) 20 MG capsule Take 2 capsules (40 mg total) by mouth daily. 180 capsule 3   ibuprofen (ADVIL,MOTRIN) 200 MG tablet Take 400 mg by mouth  every 6 (six) hours as needed (Pain). Reported on 02/12/2016     lovastatin  (MEVACOR ) 20 MG tablet Take 1 tablet (20 mg total) by mouth at bedtime. 90 tablet 3   Melatonin 5 MG TABS Take by mouth.     nicotine  (NICODERM CQ  - DOSED IN MG/24 HOURS) 21 mg/24hr patch Place 1 patch onto the skin daily.     traMADol  (ULTRAM ) 50 MG tablet Take 1 tablet (50 mg total) by mouth every 6 (six) hours as needed. for pain 60 tablet 3   vitamin B-12 (CYANOCOBALAMIN ) 500 MCG tablet Take 500 mcg by mouth daily.     No current facility-administered medications on file prior to visit.    Review of Systems:  As per HPI- otherwise negative.  Physical Examination: Vitals:   03/08/24 0808 03/08/24 1715  BP: (!) 142/82 (!) 145/85  Pulse: 80   SpO2: 94%    Vitals:   03/08/24 0808  Weight: 119 lb (54 kg)  Height: 5' 3 (1.6 m)   Body mass index is 21.08 kg/m. Ideal Body Weight: Weight in (lb) to have BMI = 25: 140.8  GEN: no acute distress. Normal weight, looks well  HEENT: Atraumatic, Normocephalic.  Bilateral TM wnl, oropharynx normal.  PEERL,EOMI.   Ears and Nose: No external deformity. CV: RRR, No M/G/R. No JVD. No thrill. No extra heart sounds. PULM: CTA B, no wheezes, crackles, rhonchi. No retractions. No resp. distress. No accessory muscle use. ABD: S, NT, ND, +BS. No rebound. No HSM. Colostomy bag in place EXTR: No c/c/e PSYCH: Normally interactive. Conversant.    Assessment and Plan: Primary osteoarthritis of both hands  Osteoporosis without current pathological fracture, unspecified osteoporosis type  Colostomy in place (HCC)  B12 deficiency - Plan: Vitamin B12  Prediabetes - Plan: Comprehensive metabolic panel with GFR, Hemoglobin A1c  History of anemia - Plan: CBC  Dysthymia  Rectal cancer (HCC) - Plan: ondansetron  (ZOFRAN ) 8 MG tablet  Thyroid  disorder screening - Plan: TSH  Vitamin D  deficiency - Plan: VITAMIN D  25 Hydroxy (Vit-D Deficiency, Fractures)  Screening,  lipid - Plan: Lipid panel  Patient seen today for periodic follow-up We note her blood pressure is slightly high, typically it is normal.  She will monitor this at home and let me know if it continues to run high Follow-up on prediabetes, vitamin D  deficiency, we will also do screening labs for her as above She is on Fosamax  for history of osteoporosis Mood symptoms well-controlled fluoxetine  Signed Harlene Schroeder, MD   Received her labs, message to patient  Results for orders placed or performed in visit on 03/08/24  CBC   Collection Time: 03/08/24  8:29 AM  Result Value Ref Range   WBC 4.2 4.0 - 10.5 K/uL   RBC 4.81 3.87 - 5.11 Mil/uL   Platelets 247.0 150.0 - 400.0 K/uL   Hemoglobin 14.9 12.0 - 15.0 g/dL   HCT 55.0 63.9 - 53.9 %   MCV 93.3 78.0 - 100.0 fl   MCHC 33.3 30.0 - 36.0 g/dL   RDW 86.6 88.4 - 84.4 %  Comprehensive metabolic panel with GFR   Collection Time: 03/08/24  8:29 AM  Result Value Ref Range   Sodium 141 135 - 145 mEq/L   Potassium 4.1 3.5 - 5.1 mEq/L   Chloride 107 96 - 112 mEq/L   CO2 28 19 - 32 mEq/L   Glucose, Bld 82 70 - 99 mg/dL   BUN 10 6 - 23 mg/dL   Creatinine, Ser 9.23 0.40 - 1.20 mg/dL   Total Bilirubin 0.6 0.2 - 1.2 mg/dL   Alkaline Phosphatase 73 39 - 117 U/L   AST 19 0 - 37 U/L   ALT 11 0 - 35 U/L   Total Protein 6.5 6.0 - 8.3 g/dL   Albumin  3.8 3.5 - 5.2 g/dL   GFR 15.94 >39.99 mL/min   Calcium 9.5 8.4 - 10.5 mg/dL  Hemoglobin J8r   Collection Time: 03/08/24  8:29 AM  Result Value Ref Range   Hgb A1c MFr Bld 6.0 4.6 - 6.5 %  Lipid panel   Collection Time: 03/08/24  8:29 AM  Result Value Ref Range   Cholesterol 133 0 - 200 mg/dL   Triglycerides 12.9 0.0 - 149.0 mg/dL   HDL  64.90 >39.00 mg/dL   VLDL 82.5 0.0 - 59.9 mg/dL   LDL Cholesterol 51 0 - 99 mg/dL   Total CHOL/HDL Ratio 2    NonHDL 67.99   TSH   Collection Time: 03/08/24  8:29 AM  Result Value Ref Range   TSH 1.02 0.35 - 5.50 uIU/mL  VITAMIN D  25 Hydroxy (Vit-D  Deficiency, Fractures)   Collection Time: 03/08/24  8:29 AM  Result Value Ref Range   VITD 64.68 30.00 - 100.00 ng/mL  Vitamin B12   Collection Time: 03/08/24  8:29 AM  Result Value Ref Range   Vitamin B-12 >1500 (H) 211 - 911 pg/mL

## 2024-03-04 NOTE — Patient Instructions (Addendum)
 Good to see you again today-I will be in touch with your labs.  Assuming you are doing well please see me in about 6 months Please keep an eye on your BP when you are at a store with a BP cuff.  If you are consistently over 140/85 please let me know

## 2024-03-08 ENCOUNTER — Encounter: Payer: Self-pay | Admitting: Family Medicine

## 2024-03-08 ENCOUNTER — Ambulatory Visit (INDEPENDENT_AMBULATORY_CARE_PROVIDER_SITE_OTHER): Admitting: Family Medicine

## 2024-03-08 VITALS — BP 145/85 | HR 80 | Ht 63.0 in | Wt 119.0 lb

## 2024-03-08 DIAGNOSIS — M19042 Primary osteoarthritis, left hand: Secondary | ICD-10-CM | POA: Diagnosis not present

## 2024-03-08 DIAGNOSIS — Z862 Personal history of diseases of the blood and blood-forming organs and certain disorders involving the immune mechanism: Secondary | ICD-10-CM

## 2024-03-08 DIAGNOSIS — E559 Vitamin D deficiency, unspecified: Secondary | ICD-10-CM | POA: Diagnosis not present

## 2024-03-08 DIAGNOSIS — M19041 Primary osteoarthritis, right hand: Secondary | ICD-10-CM | POA: Diagnosis not present

## 2024-03-08 DIAGNOSIS — Z13220 Encounter for screening for lipoid disorders: Secondary | ICD-10-CM | POA: Diagnosis not present

## 2024-03-08 DIAGNOSIS — E538 Deficiency of other specified B group vitamins: Secondary | ICD-10-CM

## 2024-03-08 DIAGNOSIS — F341 Dysthymic disorder: Secondary | ICD-10-CM

## 2024-03-08 DIAGNOSIS — M810 Age-related osteoporosis without current pathological fracture: Secondary | ICD-10-CM

## 2024-03-08 DIAGNOSIS — C20 Malignant neoplasm of rectum: Secondary | ICD-10-CM

## 2024-03-08 DIAGNOSIS — Z1329 Encounter for screening for other suspected endocrine disorder: Secondary | ICD-10-CM

## 2024-03-08 DIAGNOSIS — Z933 Colostomy status: Secondary | ICD-10-CM

## 2024-03-08 DIAGNOSIS — R7303 Prediabetes: Secondary | ICD-10-CM

## 2024-03-08 LAB — COMPREHENSIVE METABOLIC PANEL WITH GFR
ALT: 11 U/L (ref 0–35)
AST: 19 U/L (ref 0–37)
Albumin: 3.8 g/dL (ref 3.5–5.2)
Alkaline Phosphatase: 73 U/L (ref 39–117)
BUN: 10 mg/dL (ref 6–23)
CO2: 28 meq/L (ref 19–32)
Calcium: 9.5 mg/dL (ref 8.4–10.5)
Chloride: 107 meq/L (ref 96–112)
Creatinine, Ser: 0.76 mg/dL (ref 0.40–1.20)
GFR: 84.05 mL/min (ref >60.00)
Glucose, Bld: 82 mg/dL (ref 70–99)
Potassium: 4.1 meq/L (ref 3.5–5.1)
Sodium: 141 meq/L (ref 135–145)
Total Bilirubin: 0.6 mg/dL (ref 0.2–1.2)
Total Protein: 6.5 g/dL (ref 6.0–8.3)

## 2024-03-08 LAB — LIPID PANEL
Cholesterol: 133 mg/dL (ref 0–200)
HDL: 64.9 mg/dL (ref >39.00)
LDL Cholesterol: 51 mg/dL (ref 0–99)
NonHDL: 67.99
Total CHOL/HDL Ratio: 2
Triglycerides: 87 mg/dL (ref 0.0–149.0)
VLDL: 17.4 mg/dL (ref 0.0–40.0)

## 2024-03-08 LAB — TSH: TSH: 1.02 u[IU]/mL (ref 0.35–5.50)

## 2024-03-08 LAB — CBC
HCT: 44.9 % (ref 36.0–46.0)
Hemoglobin: 14.9 g/dL (ref 12.0–15.0)
MCHC: 33.3 g/dL (ref 30.0–36.0)
MCV: 93.3 fl (ref 78.0–100.0)
Platelets: 247 K/uL (ref 150.0–400.0)
RBC: 4.81 Mil/uL (ref 3.87–5.11)
RDW: 13.3 % (ref 11.5–15.5)
WBC: 4.2 K/uL (ref 4.0–10.5)

## 2024-03-08 LAB — HEMOGLOBIN A1C: Hgb A1c MFr Bld: 6 % (ref 4.6–6.5)

## 2024-03-08 LAB — VITAMIN B12: Vitamin B-12: >1500 pg/mL — ABNORMAL HIGH (ref 211–911)

## 2024-03-08 LAB — VITAMIN D 25 HYDROXY (VIT D DEFICIENCY, FRACTURES): VITD: 64.68 ng/mL (ref 30.00–100.00)

## 2024-03-08 MED ORDER — ONDANSETRON HCL 8 MG PO TABS: 8.0000 mg | ORAL_TABLET | Freq: Three times a day (TID) | ORAL | 2 refills | Status: DC | PRN

## 2024-03-15 ENCOUNTER — Ambulatory Visit: Payer: BLUE CROSS/BLUE SHIELD | Admitting: Family Medicine

## 2024-05-28 ENCOUNTER — Other Ambulatory Visit: Payer: Self-pay | Admitting: Family Medicine

## 2024-05-28 DIAGNOSIS — M810 Age-related osteoporosis without current pathological fracture: Secondary | ICD-10-CM

## 2024-06-07 ENCOUNTER — Encounter: Payer: Self-pay | Admitting: Family Medicine

## 2024-06-07 DIAGNOSIS — M19042 Primary osteoarthritis, left hand: Secondary | ICD-10-CM

## 2024-06-07 MED ORDER — TRAMADOL HCL 50 MG PO TABS: 50.0000 mg | ORAL_TABLET | Freq: Four times a day (QID) | ORAL | 3 refills | Status: DC | PRN

## 2024-08-18 ENCOUNTER — Other Ambulatory Visit: Payer: Self-pay | Admitting: Family Medicine

## 2024-08-18 DIAGNOSIS — M810 Age-related osteoporosis without current pathological fracture: Secondary | ICD-10-CM

## 2024-09-02 ENCOUNTER — Other Ambulatory Visit: Payer: Self-pay | Admitting: Family Medicine

## 2024-09-02 DIAGNOSIS — F341 Dysthymic disorder: Secondary | ICD-10-CM

## 2024-09-04 NOTE — Progress Notes (Unsigned)
 [facility] [address], [address] [place], [place] [address] [phone]  Date:  09/08/2024   Name:  Angela Schmidt   DOB:  1961/01/08   MRN:  [id number]  PCP:  Watt Harlene BROCKS, MD    Chief Complaint: No chief complaint on file.   History of Present Illness:  Angela Schmidt is a 64 y.o. very pleasant female patient who presents with the following:  Patient seen today for periodic follow-up visit.  I saw her most recently in July She has history of rectal cancer 2014 with permanent ostomy, peripheral artery disease and claudication, arthritis which is most severe in her hands, osteoporosis and vitamin D  deficiency taking Fosamax , prediabetes, tobacco use, history of B12 deficiency, prediabetes   She has 3 adult daughters- everyone is doing well She is on a every 5 year colonoscopy schedule, most recent scope 2024  Labs updated in July-A1c 6% Shingrix  is done Recommend Prevnar 20 Flu shot Mammogram due in March  Lovastatin  Fluoxetine  Fosamax  Discussed the use of AI scribe software for clinical note transcription with the patient, who gave verbal consent to proceed.  History of Present Illness    Patient Active Problem List   Diagnosis Date Noted   Osteoporosis 06/17/2017   Vitamin D  insufficiency 04/19/2016   Tobacco use disorder 04/19/2016   Absolute anemia 04/19/2016   Breast cancer screening, high risk patient 04/19/2016   Chronic back pain 04/19/2016   Colostomy in place [facility]) 04/19/2016   Rectal stenosis 04/04/2015   Right thyroid  nodule 03/07/2014   Slow transit constipation 03/07/2014   Personal history of malignant neoplasm of rectum 2014 02/21/2014   Rectal cancer (HCC) 02/25/2013    Past Medical History:  Diagnosis Date   Arthritis    LUMBAR   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 04-29-2013  currently on oral chemo med---  oncologist-- dr cloretta    06-14-2013  s/p anterior resection w/ Diverting ileostomy---   Complication of anesthesia    shivers   History of kidney stones    Rectal stenosis    S/P ileostomy (HCC)    Wears contact lenses     Past Surgical History:  Procedure Laterality Date   BOWEL RESECTION N/A 08/01/2015   Procedure: SMALL BOWEL RESECTION, takedown of colorectal anastomosis with colostomy;  Surgeon: Bernarda Ned, MD;  Location: [facility];  Service: General;  Laterality: N/A;   BUNIONECTOMY Left 1984   COLOSTOMY     COLOSTOMY     [facility]enant Colostomy   COLOSTOMY REVERSAL N/A 11/18/2013   Procedure: LOOP ILEOSTOMY CLOSURE, FLEXIBLE SIGMOIDOSCOPY;  Surgeon: Bernarda Ned, MD;  Location: [facility];  Service: General;  Laterality: N/A;   EUS N/A 02/25/2013   Procedure: LOWER ENDOSCOPIC ULTRASOUND (EUS);  Surgeon: Toribio SHAUNNA Cedar, MD;  Location: [facility];  Service: Endoscopy;  Laterality: N/A;  radial   EXAMINATION UNDER ANESTHESIA N/A 07/21/2013   Procedure: EXAM UNDER ANESTHESIA/ RECTAL DILITATION;  Surgeon: Bernarda Ned, MD;  Location: [facility] ;  Service: General;  Laterality: N/A;   FLEXIBLE SIGMOIDOSCOPY N/A 07/21/2013   Procedure: FLEXIBLE SIGMOIDOSCOPY;  Surgeon: Bernarda Ned, MD;  Location: [facility] ;  Service: General;  Laterality: N/A;   FLEXIBLE SIGMOIDOSCOPY N/A 11/18/2013   Procedure: FLEXIBLE SIGMOIDOSCOPY;  Surgeon: Bernarda Ned, MD;  Location: [facility];  Service: General;  Laterality: N/A;   FLEXIBLE SIGMOIDOSCOPY N/A 08/01/2015   Procedure: FLEXIBLE SIGMOIDOSCOPY  with biopsies;  Surgeon: Bernarda Ned, MD;  Location: [facility];  Service: General;  Laterality: N/A;   LAPAROSCOPIC LOW ANTERIOR RESECTION N/A 06/14/2013   Procedure: LAPAROSCOPIC LOW ANTERIOR RESECTION AND DIVERTING LAPAROSCOPIC ILEOSTOMY;  Surgeon: Bernarda Ned, MD;  Location: [facility];  Service: General;  Laterality: N/A;  with splenic flexure takdown   LAPAROSCOPY N/A 08/01/2015   Procedure: LAPAROSCOPY ;   Surgeon: Bernarda Ned, MD;  Location: [facility];  Service: General;  Laterality: N/A;   TUBAL LIGATION  1994    Social History[1]  Family History  Problem Relation Age of Onset   Arthritis Mother    Diabetes Mother    Thyroid  disease Mother    Arthritis Father    Diabetes Father    Parkinson's disease Father    Colon cancer Neg Hx    Esophageal cancer Neg Hx    Pancreatic cancer Neg Hx    Rectal cancer Neg Hx    Stomach cancer Neg Hx     Allergies[2]  Medication list has been reviewed and updated.  Medications Ordered Prior to Encounter[3]  Review of Systems:  As per HPI- otherwise negative.   Physical Examination: There were no vitals filed for this visit. There were no vitals filed for this visit. There is no height or weight on file to calculate BMI. Ideal Body Weight:    GEN: no acute distress. HEENT: Atraumatic, Normocephalic.  Ears and Nose: No external deformity. CV: RRR, No M/G/R. No JVD. No thrill. No extra heart sounds. PULM: CTA B, no wheezes, crackles, rhonchi. No retractions. No resp. distress. No accessory muscle use. ABD: S, NT, ND, +BS. No rebound. No HSM. EXTR: No c/c/e PSYCH: Normally interactive. Conversant.    Assessment and Plan: No diagnosis found.  Assessment & Plan   Signed Harlene Schroeder, MD    [1]  Social History Tobacco Use   Smoking status: Some Days    Current packs/day: 0.50    Average packs/day: 0.5 packs/day for 30.0 years (15.0 ttl pk-yrs)    Types: Cigarettes   Smokeless tobacco: Never   Tobacco comments:    9 cigarettes/ per day. cut back from 2packs per day   Vaping Use   Vaping status: Never Used  Substance Use Topics   Alcohol use: No    Alcohol/week: 0.0 standard drinks of alcohol   Drug use: No  [2]  Allergies Allergen Reactions   Meperidine Nausea Only and Nausea And Vomiting  [3]  Current Outpatient Medications on File Prior to Visit  Medication Sig Dispense Refill   alendronate  (FOSAMAX ) 70 MG  tablet TAKE ONE TABLET BY MOUTH ONCE WEEKLY WITH A FULL GLASS OF WATER ON AN EMPTY STOMACH 12 tablet 0   cholecalciferol (VITAMIN D ) 1000 units tablet Take 1,000 Units by mouth daily.     FLUoxetine  (PROZAC ) 20 MG capsule Take 2 capsules by mouth once daily 180 capsule 0   ibuprofen (ADVIL,MOTRIN) 200 MG tablet Take 400 mg by mouth every 6 (six) hours as needed (Pain). Reported on 02/12/2016     lovastatin  (MEVACOR ) 20 MG tablet Take 1 tablet (20 mg total) by mouth at bedtime. 90 tablet 3   Melatonin 5 MG TABS Take by mouth.     nicotine  (NICODERM CQ  - DOSED IN MG/24 HOURS) 21 mg/24hr patch Place 1 patch onto the skin daily.     ondansetron  (ZOFRAN ) 8 MG tablet Take 1 tablet (8 mg total) by mouth every 8 (eight) hours as needed. for nausea 30 tablet 2  traMADol  (ULTRAM ) 50 MG tablet Take 1 tablet (50 mg total) by mouth every 6 (six) hours as needed. for pain 60 tablet 3   vitamin B-12 (CYANOCOBALAMIN ) 500 MCG tablet Take 500 mcg by mouth daily.     No current facility-administered medications on file prior to visit.   "

## 2024-09-04 NOTE — Patient Instructions (Incomplete)
 It was good to see you today.  Assuming all is well please see me in about 6 months.  I will be in touch with your labs

## 2024-09-08 ENCOUNTER — Encounter: Payer: Self-pay | Admitting: Family Medicine

## 2024-09-08 ENCOUNTER — Ambulatory Visit: Payer: Self-pay | Admitting: Family Medicine

## 2024-09-08 VITALS — BP 142/100 | HR 86 | Temp 97.9°F | Resp 16 | Ht 63.0 in | Wt 117.8 lb

## 2024-09-08 DIAGNOSIS — M19042 Primary osteoarthritis, left hand: Secondary | ICD-10-CM | POA: Diagnosis not present

## 2024-09-08 DIAGNOSIS — E119 Type 2 diabetes mellitus without complications: Secondary | ICD-10-CM | POA: Diagnosis not present

## 2024-09-08 DIAGNOSIS — Z23 Encounter for immunization: Secondary | ICD-10-CM | POA: Diagnosis not present

## 2024-09-08 DIAGNOSIS — M545 Low back pain, unspecified: Secondary | ICD-10-CM | POA: Diagnosis not present

## 2024-09-08 DIAGNOSIS — M19041 Primary osteoarthritis, right hand: Secondary | ICD-10-CM | POA: Diagnosis not present

## 2024-09-08 DIAGNOSIS — Z862 Personal history of diseases of the blood and blood-forming organs and certain disorders involving the immune mechanism: Secondary | ICD-10-CM | POA: Diagnosis not present

## 2024-09-08 DIAGNOSIS — C20 Malignant neoplasm of rectum: Secondary | ICD-10-CM | POA: Diagnosis not present

## 2024-09-08 DIAGNOSIS — G8929 Other chronic pain: Secondary | ICD-10-CM | POA: Diagnosis not present

## 2024-09-08 DIAGNOSIS — R7303 Prediabetes: Secondary | ICD-10-CM | POA: Insufficient documentation

## 2024-09-08 DIAGNOSIS — M810 Age-related osteoporosis without current pathological fracture: Secondary | ICD-10-CM

## 2024-09-08 LAB — CBC
HCT: 44.6 % (ref 36.0–46.0)
Hemoglobin: 15 g/dL (ref 12.0–15.0)
MCHC: 33.7 g/dL (ref 30.0–36.0)
MCV: 93.3 fl (ref 78.0–100.0)
Platelets: 293 K/uL (ref 150.0–400.0)
RBC: 4.79 Mil/uL (ref 3.87–5.11)
RDW: 13.7 % (ref 11.5–15.5)
WBC: 4.4 K/uL (ref 4.0–10.5)

## 2024-09-08 LAB — BASIC METABOLIC PANEL WITH GFR
BUN: 10 mg/dL (ref 6–23)
CO2: 32 meq/L (ref 19–32)
Calcium: 9.8 mg/dL (ref 8.4–10.5)
Chloride: 104 meq/L (ref 96–112)
Creatinine, Ser: 0.89 mg/dL (ref 0.40–1.20)
GFR: 69.3 mL/min
Glucose, Bld: 81 mg/dL (ref 70–99)
Potassium: 4.3 meq/L (ref 3.5–5.1)
Sodium: 140 meq/L (ref 135–145)

## 2024-09-08 LAB — HEMOGLOBIN A1C: Hgb A1c MFr Bld: 6 % (ref 4.6–6.5)

## 2024-09-08 MED ORDER — ONDANSETRON HCL 8 MG PO TABS: 8.0000 mg | ORAL_TABLET | Freq: Three times a day (TID) | ORAL | 2 refills | Status: AC | PRN

## 2024-09-08 MED ORDER — TRAMADOL HCL 50 MG PO TABS: 50.0000 mg | ORAL_TABLET | Freq: Four times a day (QID) | ORAL | 3 refills | Status: AC | PRN

## 2024-09-17 ENCOUNTER — Telehealth: Payer: Self-pay

## 2024-09-17 ENCOUNTER — Encounter: Payer: Self-pay | Admitting: Family Medicine

## 2024-09-17 ENCOUNTER — Other Ambulatory Visit (HOSPITAL_COMMUNITY): Payer: Self-pay

## 2024-09-17 NOTE — Telephone Encounter (Signed)
 Pharmacy Patient Advocate Encounter   Received notification from Physician's Office that prior authorization for Tramadol  is required/requested.   Insurance verification completed.   The patient is insured through [facility] MEDICAID.   Per test claim: PA required; PA submitted to above mentioned insurance via Latent Key/confirmation #/[id number] Status is pending

## 2024-09-20 ENCOUNTER — Other Ambulatory Visit (HOSPITAL_COMMUNITY): Payer: Self-pay

## 2024-09-24 ENCOUNTER — Encounter: Payer: Self-pay | Admitting: Family Medicine

## 2025-03-09 ENCOUNTER — Ambulatory Visit: Admitting: Family Medicine
